# Patient Record
Sex: Male | Born: 1955
Health system: Southern US, Community
[De-identification: ages and names within clinical notes are randomized; demographics above are authoritative.]

## PROBLEM LIST (undated history)

## (undated) DIAGNOSIS — E785 Hyperlipidemia, unspecified: Secondary | ICD-10-CM

## (undated) DIAGNOSIS — I10 Essential (primary) hypertension: Secondary | ICD-10-CM

## (undated) DIAGNOSIS — R7303 Prediabetes: Secondary | ICD-10-CM

## (undated) DIAGNOSIS — K219 Gastro-esophageal reflux disease without esophagitis: Secondary | ICD-10-CM

## (undated) HISTORY — PX: WISDOM TOOTH EXTRACTION: SHX21

## (undated) HISTORY — DX: Essential (primary) hypertension: I10

## (undated) HISTORY — DX: Prediabetes: R73.03

## (undated) HISTORY — DX: Hyperlipidemia, unspecified: E78.5

## (undated) HISTORY — PX: TONSILLECTOMY AND ADENOIDECTOMY: SUR1326

---

## 2002-08-18 ENCOUNTER — Emergency Department (HOSPITAL_COMMUNITY): Admission: EM | Admit: 2002-08-18 | Discharge: 2002-08-18 | Payer: Self-pay | Admitting: Emergency Medicine

## 2002-08-21 ENCOUNTER — Ambulatory Visit (HOSPITAL_BASED_OUTPATIENT_CLINIC_OR_DEPARTMENT_OTHER): Admission: RE | Admit: 2002-08-21 | Discharge: 2002-08-21 | Payer: Self-pay | Admitting: Urology

## 2004-09-22 ENCOUNTER — Ambulatory Visit: Payer: Self-pay | Admitting: Internal Medicine

## 2004-09-28 ENCOUNTER — Ambulatory Visit: Payer: Self-pay | Admitting: Internal Medicine

## 2004-10-27 ENCOUNTER — Ambulatory Visit: Payer: Self-pay | Admitting: Internal Medicine

## 2005-09-27 ENCOUNTER — Ambulatory Visit: Payer: Self-pay | Admitting: Internal Medicine

## 2005-09-29 ENCOUNTER — Ambulatory Visit: Payer: Self-pay | Admitting: Internal Medicine

## 2005-10-04 ENCOUNTER — Ambulatory Visit: Payer: Self-pay | Admitting: Internal Medicine

## 2006-02-20 ENCOUNTER — Ambulatory Visit: Payer: Self-pay | Admitting: Internal Medicine

## 2006-02-20 LAB — CONVERTED CEMR LAB
ALT: 35 units/L (ref 0–40)
AST: 25 units/L (ref 0–37)
BUN: 8 mg/dL (ref 6–23)
Chol/HDL Ratio, serum: 7.1
Cholesterol: 192 mg/dL (ref 0–200)
Creatinine, Ser: 0.9 mg/dL (ref 0.4–1.5)
HDL: 27.1 mg/dL — ABNORMAL LOW (ref >39.0)
Hgb A1c MFr Bld: 5.5 % (ref 4.6–6.0)
LDL DIRECT: 105.4 mg/dL
Potassium: 3.6 meq/L (ref 3.5–5.1)
Triglyceride fasting, serum: 307 mg/dL (ref 0–149)
VLDL: 61 mg/dL — ABNORMAL HIGH (ref 0–40)

## 2006-02-27 ENCOUNTER — Ambulatory Visit: Payer: Self-pay | Admitting: Internal Medicine

## 2006-06-28 ENCOUNTER — Ambulatory Visit: Payer: Self-pay | Admitting: Internal Medicine

## 2006-06-28 LAB — CONVERTED CEMR LAB
ALT: 27 units/L (ref 0–40)
AST: 27 units/L (ref 0–37)
Cholesterol: 208 mg/dL (ref 0–200)
Creatinine,U: 236.6 mg/dL
Direct LDL: 134.7 mg/dL
HDL: 27.3 mg/dL — ABNORMAL LOW (ref >39.0)
Hgb A1c MFr Bld: 5.6 % (ref 4.6–6.0)
Microalb Creat Ratio: 65.5 mg/g — ABNORMAL HIGH (ref 0.0–30.0)
Microalb, Ur: 15.5 mg/dL — ABNORMAL HIGH (ref 0.0–1.9)
Total CHOL/HDL Ratio: 7.6
Triglycerides: 264 mg/dL (ref 0–149)
VLDL: 53 mg/dL — ABNORMAL HIGH (ref 0–40)

## 2006-07-05 ENCOUNTER — Ambulatory Visit: Payer: Self-pay | Admitting: Internal Medicine

## 2006-10-26 ENCOUNTER — Ambulatory Visit: Payer: Self-pay | Admitting: Internal Medicine

## 2006-10-30 ENCOUNTER — Encounter (INDEPENDENT_AMBULATORY_CARE_PROVIDER_SITE_OTHER): Payer: Self-pay | Admitting: *Deleted

## 2006-10-30 LAB — CONVERTED CEMR LAB
Basophils Absolute: 0 10*3/uL (ref 0.0–0.1)
Basophils Relative: 0 % (ref 0–1)
Eosinophils Absolute: 0.2 10*3/uL (ref 0.0–0.7)
Eosinophils Relative: 2 % (ref 0–5)
HCT: 47.1 % (ref 39.0–52.0)
Hemoglobin: 15.3 g/dL (ref 13.0–17.0)
Lymphocytes Relative: 15 % (ref 12–46)
Lymphs Abs: 1.6 10*3/uL (ref 0.7–3.3)
MCHC: 32.5 g/dL (ref 30.0–36.0)
MCV: 91.5 fL (ref 78.0–100.0)
Monocytes Absolute: 1 10*3/uL — ABNORMAL HIGH (ref 0.2–0.7)
Monocytes Relative: 9 % (ref 3–11)
Neutro Abs: 8.2 10*3/uL — ABNORMAL HIGH (ref 1.7–7.7)
Neutrophils Relative %: 74 % (ref 43–77)
Platelets: 342 10*3/uL (ref 150–400)
RBC: 5.15 M/uL (ref 4.22–5.81)
RDW: 13.6 % (ref 11.5–14.0)
TSH: 1.998 microintl units/mL (ref 0.350–5.50)
WBC: 11 10*3/uL — ABNORMAL HIGH (ref 4.0–10.5)

## 2007-01-16 ENCOUNTER — Ambulatory Visit: Payer: Self-pay | Admitting: Internal Medicine

## 2007-01-16 DIAGNOSIS — F528 Other sexual dysfunction not due to a substance or known physiological condition: Secondary | ICD-10-CM | POA: Insufficient documentation

## 2007-01-16 DIAGNOSIS — I10 Essential (primary) hypertension: Secondary | ICD-10-CM | POA: Insufficient documentation

## 2007-01-16 DIAGNOSIS — E8881 Metabolic syndrome: Secondary | ICD-10-CM | POA: Insufficient documentation

## 2007-01-16 DIAGNOSIS — N400 Enlarged prostate without lower urinary tract symptoms: Secondary | ICD-10-CM | POA: Insufficient documentation

## 2007-01-16 LAB — CONVERTED CEMR LAB
Cholesterol, target level: 200 mg/dL
HDL goal, serum: 40 mg/dL
LDL Goal: 130 mg/dL

## 2007-01-21 ENCOUNTER — Encounter (INDEPENDENT_AMBULATORY_CARE_PROVIDER_SITE_OTHER): Payer: Self-pay | Admitting: *Deleted

## 2007-01-21 LAB — CONVERTED CEMR LAB
ALT: 25 units/L (ref 0–53)
AST: 23 units/L (ref 0–37)
Albumin: 4.3 g/dL (ref 3.5–5.2)
Alkaline Phosphatase: 32 units/L — ABNORMAL LOW (ref 39–117)
BUN: 14 mg/dL (ref 6–23)
Bilirubin, Direct: 0.1 mg/dL (ref 0.0–0.3)
CO2: 32 meq/L (ref 19–32)
Calcium: 9.5 mg/dL (ref 8.4–10.5)
Chloride: 106 meq/L (ref 96–112)
Cholesterol: 220 mg/dL (ref 0–200)
Creatinine, Ser: 1 mg/dL (ref 0.4–1.5)
Direct LDL: 139.9 mg/dL
GFR calc Af Amer: 101 mL/min
GFR calc non Af Amer: 84 mL/min
Glucose, Bld: 88 mg/dL (ref 70–99)
HDL: 28.6 mg/dL — ABNORMAL LOW (ref >39.0)
Hgb A1c MFr Bld: 5.6 % (ref 4.6–6.0)
PSA: 0.58 ng/mL (ref 0.10–4.00)
Potassium: 3.9 meq/L (ref 3.5–5.1)
Sodium: 144 meq/L (ref 135–145)
TSH: 1.65 microintl units/mL (ref 0.35–5.50)
Total Bilirubin: 0.9 mg/dL (ref 0.3–1.2)
Total CHOL/HDL Ratio: 7.7
Total Protein: 7.3 g/dL (ref 6.0–8.3)
Triglycerides: 338 mg/dL (ref 0–149)
VLDL: 68 mg/dL — ABNORMAL HIGH (ref 0–40)

## 2007-03-26 ENCOUNTER — Telehealth (INDEPENDENT_AMBULATORY_CARE_PROVIDER_SITE_OTHER): Payer: Self-pay | Admitting: *Deleted

## 2007-04-18 HISTORY — PX: OTHER SURGICAL HISTORY: SHX169

## 2007-05-29 ENCOUNTER — Ambulatory Visit: Payer: Self-pay | Admitting: Internal Medicine

## 2007-05-29 DIAGNOSIS — E782 Mixed hyperlipidemia: Secondary | ICD-10-CM | POA: Insufficient documentation

## 2007-05-29 DIAGNOSIS — E785 Hyperlipidemia, unspecified: Secondary | ICD-10-CM | POA: Insufficient documentation

## 2007-09-20 ENCOUNTER — Ambulatory Visit: Payer: Self-pay | Admitting: Internal Medicine

## 2007-09-20 LAB — CONVERTED CEMR LAB
ALT: 38 units/L (ref 0–53)
AST: 32 units/L (ref 0–37)
Albumin: 4 g/dL (ref 3.5–5.2)
Alkaline Phosphatase: 36 units/L — ABNORMAL LOW (ref 39–117)
Bilirubin, Direct: 0.1 mg/dL (ref 0.0–0.3)
Cholesterol: 198 mg/dL (ref 0–200)
Direct LDL: 128.4 mg/dL
HDL: 27.8 mg/dL — ABNORMAL LOW (ref >39.0)
Total Bilirubin: 0.8 mg/dL (ref 0.3–1.2)
Total CHOL/HDL Ratio: 7.1
Total Protein: 7.2 g/dL (ref 6.0–8.3)
Triglycerides: 252 mg/dL (ref 0–149)
VLDL: 50 mg/dL — ABNORMAL HIGH (ref 0–40)

## 2007-09-27 ENCOUNTER — Ambulatory Visit: Payer: Self-pay | Admitting: Internal Medicine

## 2008-02-03 ENCOUNTER — Ambulatory Visit: Payer: Self-pay | Admitting: Internal Medicine

## 2008-02-09 LAB — CONVERTED CEMR LAB
ALT: 35 units/L (ref 0–53)
AST: 27 units/L (ref 0–37)
Albumin: 3.9 g/dL (ref 3.5–5.2)
Alkaline Phosphatase: 31 units/L — ABNORMAL LOW (ref 39–117)
BUN: 14 mg/dL (ref 6–23)
Basophils Absolute: 0 10*3/uL (ref 0.0–0.1)
Basophils Relative: 0.1 % (ref 0.0–3.0)
Bilirubin, Direct: 0.1 mg/dL (ref 0.0–0.3)
CO2: 30 meq/L (ref 19–32)
Calcium: 9 mg/dL (ref 8.4–10.5)
Chloride: 105 meq/L (ref 96–112)
Cholesterol: 227 mg/dL (ref 0–200)
Creatinine, Ser: 1.1 mg/dL (ref 0.4–1.5)
Direct LDL: 147.1 mg/dL
Eosinophils Absolute: 0.2 10*3/uL (ref 0.0–0.7)
Eosinophils Relative: 3.1 % (ref 0.0–5.0)
GFR calc Af Amer: 90 mL/min
GFR calc non Af Amer: 75 mL/min
Glucose, Bld: 89 mg/dL (ref 70–99)
HCT: 42 % (ref 39.0–52.0)
HDL: 24.4 mg/dL — ABNORMAL LOW (ref >39.0)
Hemoglobin: 14.6 g/dL (ref 13.0–17.0)
Hgb A1c MFr Bld: 5.7 % (ref 4.6–6.0)
Lymphocytes Relative: 29.2 % (ref 12.0–46.0)
MCHC: 34.8 g/dL (ref 30.0–36.0)
MCV: 90.7 fL (ref 78.0–100.0)
Monocytes Absolute: 0.5 10*3/uL (ref 0.1–1.0)
Monocytes Relative: 9.7 % (ref 3.0–12.0)
Neutro Abs: 3.2 10*3/uL (ref 1.4–7.7)
Neutrophils Relative %: 57.9 % (ref 43.0–77.0)
Platelets: 289 10*3/uL (ref 150–400)
Potassium: 3.3 meq/L — ABNORMAL LOW (ref 3.5–5.1)
RBC: 4.63 M/uL (ref 4.22–5.81)
RDW: 13.1 % (ref 11.5–14.6)
Sodium: 142 meq/L (ref 135–145)
Total Bilirubin: 0.9 mg/dL (ref 0.3–1.2)
Total CHOL/HDL Ratio: 9.3
Total Protein: 7 g/dL (ref 6.0–8.3)
Triglycerides: 206 mg/dL (ref 0–149)
VLDL: 41 mg/dL — ABNORMAL HIGH (ref 0–40)
WBC: 5.5 10*3/uL (ref 4.5–10.5)

## 2008-02-10 ENCOUNTER — Encounter (INDEPENDENT_AMBULATORY_CARE_PROVIDER_SITE_OTHER): Payer: Self-pay | Admitting: *Deleted

## 2008-02-10 ENCOUNTER — Ambulatory Visit: Payer: Self-pay | Admitting: Internal Medicine

## 2008-02-10 DIAGNOSIS — E876 Hypokalemia: Secondary | ICD-10-CM | POA: Insufficient documentation

## 2008-02-11 ENCOUNTER — Ambulatory Visit: Payer: Self-pay | Admitting: Internal Medicine

## 2008-02-14 ENCOUNTER — Encounter: Payer: Self-pay | Admitting: Internal Medicine

## 2008-02-14 ENCOUNTER — Ambulatory Visit: Payer: Self-pay | Admitting: Internal Medicine

## 2008-02-20 ENCOUNTER — Encounter: Payer: Self-pay | Admitting: Internal Medicine

## 2008-06-12 ENCOUNTER — Ambulatory Visit: Payer: Self-pay | Admitting: Internal Medicine

## 2008-06-12 LAB — CONVERTED CEMR LAB
ALT: 39 units/L (ref 0–53)
AST: 25 units/L (ref 0–37)
Albumin: 4.1 g/dL (ref 3.5–5.2)
Alkaline Phosphatase: 33 units/L — ABNORMAL LOW (ref 39–117)
BUN: 13 mg/dL (ref 6–23)
Bilirubin, Direct: 0.1 mg/dL (ref 0.0–0.3)
Cholesterol: 236 mg/dL (ref 0–200)
Creatinine, Ser: 1 mg/dL (ref 0.4–1.5)
Direct LDL: 159.6 mg/dL
HDL: 22.4 mg/dL — ABNORMAL LOW (ref >39.0)
Potassium: 3.2 meq/L — ABNORMAL LOW (ref 3.5–5.1)
Total Bilirubin: 0.7 mg/dL (ref 0.3–1.2)
Total CHOL/HDL Ratio: 10.5
Total Protein: 7 g/dL (ref 6.0–8.3)
Triglycerides: 232 mg/dL (ref 0–149)
VLDL: 46 mg/dL — ABNORMAL HIGH (ref 0–40)

## 2008-06-19 ENCOUNTER — Ambulatory Visit: Payer: Self-pay | Admitting: Internal Medicine

## 2008-08-14 ENCOUNTER — Ambulatory Visit: Payer: Self-pay | Admitting: Internal Medicine

## 2008-08-21 ENCOUNTER — Ambulatory Visit: Payer: Self-pay | Admitting: Internal Medicine

## 2008-08-21 ENCOUNTER — Telehealth (INDEPENDENT_AMBULATORY_CARE_PROVIDER_SITE_OTHER): Payer: Self-pay | Admitting: *Deleted

## 2008-09-23 ENCOUNTER — Ambulatory Visit: Payer: Self-pay | Admitting: Internal Medicine

## 2008-09-29 ENCOUNTER — Encounter (INDEPENDENT_AMBULATORY_CARE_PROVIDER_SITE_OTHER): Payer: Self-pay | Admitting: *Deleted

## 2008-09-29 LAB — CONVERTED CEMR LAB
ALT: 30 units/L (ref 0–53)
AST: 25 units/L (ref 0–37)
Albumin: 4.1 g/dL (ref 3.5–5.2)
Alkaline Phosphatase: 42 units/L (ref 39–117)
BUN: 14 mg/dL (ref 6–23)
Bilirubin, Direct: 0 mg/dL (ref 0.0–0.3)
Cholesterol: 149 mg/dL (ref 0–200)
Creatinine, Ser: 0.9 mg/dL (ref 0.4–1.5)
HDL: 29.5 mg/dL — ABNORMAL LOW (ref >39.00)
LDL Cholesterol: 86 mg/dL (ref 0–99)
Potassium: 3.3 meq/L — ABNORMAL LOW (ref 3.5–5.1)
Total Bilirubin: 0.8 mg/dL (ref 0.3–1.2)
Total CHOL/HDL Ratio: 5
Total Protein: 6.8 g/dL (ref 6.0–8.3)
Triglycerides: 169 mg/dL — ABNORMAL HIGH (ref 0.0–149.0)
VLDL: 33.8 mg/dL (ref 0.0–40.0)

## 2008-10-26 ENCOUNTER — Ambulatory Visit: Payer: Self-pay | Admitting: Internal Medicine

## 2008-10-31 LAB — CONVERTED CEMR LAB
BUN: 18 mg/dL (ref 6–23)
Creatinine, Ser: 1 mg/dL (ref 0.4–1.5)
Potassium: 3.9 meq/L (ref 3.5–5.1)

## 2008-11-02 ENCOUNTER — Encounter (INDEPENDENT_AMBULATORY_CARE_PROVIDER_SITE_OTHER): Payer: Self-pay | Admitting: *Deleted

## 2009-04-19 ENCOUNTER — Telehealth (INDEPENDENT_AMBULATORY_CARE_PROVIDER_SITE_OTHER): Payer: Self-pay | Admitting: *Deleted

## 2009-04-22 ENCOUNTER — Telehealth (INDEPENDENT_AMBULATORY_CARE_PROVIDER_SITE_OTHER): Payer: Self-pay | Admitting: *Deleted

## 2009-04-23 ENCOUNTER — Telehealth (INDEPENDENT_AMBULATORY_CARE_PROVIDER_SITE_OTHER): Payer: Self-pay | Admitting: *Deleted

## 2010-01-31 ENCOUNTER — Telehealth: Payer: Self-pay | Admitting: Internal Medicine

## 2010-04-14 ENCOUNTER — Ambulatory Visit: Payer: Self-pay | Admitting: Internal Medicine

## 2010-04-15 LAB — CONVERTED CEMR LAB
ALT: 33 units/L (ref 0–53)
AST: 28 units/L (ref 0–37)
Albumin: 4 g/dL (ref 3.5–5.2)
Alkaline Phosphatase: 41 units/L (ref 39–117)
Bilirubin, Direct: 0.1 mg/dL (ref 0.0–0.3)
Cholesterol: 188 mg/dL (ref 0–200)
Direct LDL: 106.9 mg/dL
HDL: 31.4 mg/dL — ABNORMAL LOW (ref >39.00)
Hgb A1c MFr Bld: 6 % (ref 4.6–6.5)
Total Bilirubin: 0.6 mg/dL (ref 0.3–1.2)
Total CHOL/HDL Ratio: 6
Total Protein: 7.1 g/dL (ref 6.0–8.3)
Triglycerides: 367 mg/dL — ABNORMAL HIGH (ref 0.0–149.0)
VLDL: 73.4 mg/dL — ABNORMAL HIGH (ref 0.0–40.0)

## 2010-04-21 ENCOUNTER — Ambulatory Visit
Admission: RE | Admit: 2010-04-21 | Discharge: 2010-04-21 | Payer: Self-pay | Source: Home / Self Care | Attending: Internal Medicine | Admitting: Internal Medicine

## 2010-04-21 ENCOUNTER — Encounter: Payer: Self-pay | Admitting: Internal Medicine

## 2010-04-21 ENCOUNTER — Other Ambulatory Visit: Payer: Self-pay | Admitting: Internal Medicine

## 2010-04-21 DIAGNOSIS — Z860100 Personal history of colon polyps, unspecified: Secondary | ICD-10-CM | POA: Insufficient documentation

## 2010-04-21 LAB — CREATININE, SERUM: Creatinine, Ser: 0.9 mg/dL (ref 0.4–1.5)

## 2010-04-21 LAB — POTASSIUM: Potassium: 3.6 mEq/L (ref 3.5–5.1)

## 2010-04-21 LAB — PSA: PSA: 0.62 ng/mL (ref 0.10–4.00)

## 2010-04-21 LAB — TSH: TSH: 1.87 u[IU]/mL (ref 0.35–5.50)

## 2010-04-21 LAB — BUN: BUN: 15 mg/dL (ref 6–23)

## 2010-04-24 DIAGNOSIS — Z87442 Personal history of urinary calculi: Secondary | ICD-10-CM | POA: Insufficient documentation

## 2010-05-17 NOTE — Assessment & Plan Note (Signed)
Summary: RTO 2 MONTHS.CBS   Vital Signs:  Patient profile:   55 year old male Weight:      206 pounds Temp:     98.0 degrees F oral Pulse rate:   80 / minute Resp:     16 per minute BP sitting:   136 / 88  (left arm) Cuff size:   large  Vitals Entered By: Shonna Chock (Aug 21, 2008 9:13 AM) CC: FOLLOW-UP : DISCUSS BLOODSUGAR/DIET, Hypertension Management   CC:  FOLLOW-UP : DISCUSS BLOODSUGAR/DIET and Hypertension Management.  History of Present Illness: He restricts sugar now; CVE as walking 30 min1-2X/week.Weight down 15-16# since last visit. BP essen @ same range @ home.  Hypertension History:      He denies headache, chest pain, palpitations, dyspnea with exertion, orthopnea, PND, peripheral edema, visual symptoms, neurologic problems, syncope, and side effects from treatment.  He notes no problems with any antihypertensive medication side effects.  Further comments include: Risk 4X normal with BP >135/85.        Positive major cardiovascular risk factors include male age 71 years old or older, hyperlipidemia, hypertension, and family history for ischemic heart disease (males less than 77 years old).  Negative major cardiovascular risk factors include no history of diabetes and non-tobacco-user status.        Further assessment for target organ damage reveals no history of ASHD, stroke/TIA, or peripheral vascular disease.     Allergies (verified): No Known Drug Allergies  Review of Systems Eyes:  Denies blurring and double vision. CV:  Denies leg cramps with exertion. Neuro:  Denies numbness and tingling.  Physical Exam  General:  well-nourished,in no acute distress; alert,appropriate and cooperative throughout examination Heart:  Normal rate and regular rhythm. S1 and S2 normal without gallop, murmur, click, rub or other extra sounds. Pulses:  R and L carotid,radial,dorsalis pedis and posterior tibial pulses are full and equal bilaterally Psych:  memory intact for recent  and remote, normally interactive, and good eye contact.  Focused & intelligent   Impression & Recommendations:  Problem # 1:  DISORDER, DYSMETABOLIC SYNDROME X (ICD-277.7)  Problem # 2:  HYPERTENSION, ESSENTIAL NOS (ICD-401.9)  His updated medication list for this problem includes:    Norvasc 10 Mg Tabs (Amlodipine besylate) .Marland Kitchen... 1 by mouth qd    Benazepril Hcl 40 Mg Tabs (Benazepril hcl) .Marland Kitchen... 1 by mouth qd  His updated medication list for this problem includes:    Norvasc 10 Mg Tabs (Amlodipine besylate) .Marland Kitchen... 1 by mouth qd    Benazepril Hcl 40 Mg Tabs (Benazepril hcl) .Marland Kitchen... 1 by mouth qd  Problem # 3:  HYPERLIPIDEMIA (ICD-272.2)  His updated medication list for this problem includes:    Niacin 500 Mg Tabs (Niacin) .Marland Kitchen... 1 by mouth once daily    Trilipix 135 Mg Cpdr (Choline fenofibrate) .Marland Kitchen... 1 qd    Crestor 10 Mg Tabs (Rosuvastatin calcium) .Marland Kitchen... 1 m,w, f  His updated medication list for this problem includes:    Niacin 500 Mg Tabs (Niacin) .Marland Kitchen... 1 by mouth once daily    Trilipix 135 Mg Cpdr (Choline fenofibrate) .Marland Kitchen... 1 qd    Crestor 10 Mg Tabs (Rosuvastatin calcium) .Marland Kitchen... 1 m,w, f  Complete Medication List: 1)  Norvasc 10 Mg Tabs (Amlodipine besylate) .Marland Kitchen.. 1 by mouth qd 2)  Benazepril Hcl 40 Mg Tabs (Benazepril hcl) .Marland Kitchen.. 1 by mouth qd 3)  Niacin 500 Mg Tabs (Niacin) .Marland Kitchen.. 1 by mouth once daily 4)  Adult Aspirin Low  Strength 81 Mg Tbdp (Aspirin) .Marland Kitchen.. 1 by mouth once daily 5)  Vitamin E 400 Unit Caps (Vitamin e) .Marland Kitchen.. 1 by mouth once daily 6)  Multivitamins Tabs (Multiple vitamin) .Marland Kitchen.. 1 by mouth once daily 7)  Fish Oil 1000 Mg Caps (Omega-3 fatty acids) .Marland Kitchen.. 1 by mouth once daily 8)  Trilipix 135 Mg Cpdr (Choline fenofibrate) .Marland Kitchen.. 1 qd 9)  Crestor 10 Mg Tabs (Rosuvastatin calcium) .Marland Kitchen.. 1 m,w, f 10)  Potassium Chloride Cr 10 Meq Cr-caps (Potassium chloride) .Marland Kitchen.. 1 once daily 11)  Cialis 20 Mg Tabs (Tadalafil) .Marland Kitchen.. 1 q 3 days as needed  Hypertension Assessment/Plan:       The patient's hypertensive risk group is category B: At least one risk factor (excluding diabetes) with no target organ damage.  Today's blood pressure is 136/88.    Patient Instructions: 1)  Continue the low glycemic index & load program  as discussed.

## 2010-05-17 NOTE — Miscellaneous (Signed)
Summary: [facility] Previsit/prep  Clinical Lists Changes  Medications: Added new medication of DULCOLAX 5 MG  TBEC (BISACODYL) Day before procedure take 2 at 3pm and 2 at 8pm. - Signed Added new medication of METOCLOPRAMIDE HCL 10 MG  TABS (METOCLOPRAMIDE HCL) As per prep instructions. - Signed Added new medication of MIRALAX   POWD (POLYETHYLENE GLYCOL 3350) As per prep  instructions. - Signed Rx of DULCOLAX 5 MG  TBEC (BISACODYL) Day before procedure take 2 at 3pm and 2 at 8pm.;  #4 x 0;  Signed;  Entered by: Wyona Almas RN;  Authorized by: Iva Boop MD;  Method used: Electronically to [facility]*, [address], [place], [place]  [address], Ph: [phone], Fax: [phone] Rx of METOCLOPRAMIDE HCL 10 MG  TABS (METOCLOPRAMIDE HCL) As per prep instructions.;  #2 x 0;  Signed;  Entered by: Wyona Almas RN;  Authorized by: Iva Boop MD;  Method used: Electronically to [facility]*, [address], [place], [place]  [address], Ph: [phone], Fax: [phone] Rx of MIRALAX   POWD (POLYETHYLENE GLYCOL 3350) As per prep  instructions.;  #255gm x 0;  Signed;  Entered by: Wyona Almas RN;  Authorized by: Iva Boop MD;  Method used: Electronically to [facility]*, [address], [place], [place]  [address], Ph: [phone], Fax: [phone] Observations: Added new observation of NKA: T (02/11/2008 15:23)    Prescriptions: MIRALAX   POWD (POLYETHYLENE GLYCOL 3350) As per prep  instructions.  #255gm x 0   Entered by:   Wyona Almas RN   Authorized by:   Iva Boop MD   Signed by:   Wyona Almas RN on 02/11/2008   Method used:   Electronically to        [facility]* (retail)       [address]       [place], [place]  [address]       Ph: [phone]       Fax: [phone]   RxID:   [id number] METOCLOPRAMIDE HCL 10 MG  TABS (METOCLOPRAMIDE HCL) As per prep instructions.  #2 x 0   Entered by:   Wyona Almas RN   Authorized by:   Iva Boop MD  Signed by:   Wyona Almas RN on 02/11/2008   Method used:   Electronically to        [facility]* (retail)       [address]       [place], [place]  [address]       Ph: [phone]       Fax: [phone]   RxID:   [id number] DULCOLAX 5 MG  TBEC (BISACODYL) Day before procedure take 2 at 3pm and 2 at 8pm.  #4 x 0   Entered by:   Wyona Almas RN   Authorized by:   Iva Boop MD   Signed by:   Wyona Almas RN on 02/11/2008   Method used:   Electronically to        [facility]* (retail)       [address]       [place], [place]  [address]       Ph: [phone]       Fax: [phone]   RxID:   [id number]

## 2010-05-17 NOTE — Letter (Signed)
Summary: Results Follow up Letter  [facility]  [address] [place], [place] [address]   Phone: [phone]  Fax: [phone]    10/30/2006 MRN: [id number]  Tim Decker [address] [facility] [place], [place]  [address]  Dear [name],  The following are the results of your recent test(s):  Test         Result    Pap Smear:        Normal _____  Not Normal _____ Comments: ______________________________________________________ Cholesterol: LDL(Bad cholesterol):         Your goal is less than:         HDL (Good cholesterol):       Your goal is more than: Comments:  ______________________________________________________ Mammogram:        Normal _____  Not Normal _____ Comments:  ___________________________________________________________________ Hemoccult:        Normal _____  Not normal _______ Comments:    _____________________________________________________________________ Other Tests:  Please see attached results and comments   We routinely do not discuss normal results over the telephone.  If you desire a copy of the results, or you have any questions about this information we can discuss them at your next office visit.   Sincerely,

## 2010-05-17 NOTE — Letter (Signed)
Summary: Results Follow up Letter  [facility]  [address] [place], [place] [address]   Phone: [phone]  Fax: [phone]    09/29/2008 MRN: [id number]  Tim Decker [address] [facility] [address] [place], [place]  [address]  Dear Mr. Yankowski,  The following are the results of your recent test(s):  Test         Result    Pap Smear:        Normal _____  Not Normal _____ Comments: ______________________________________________________ Cholesterol: LDL(Bad cholesterol):         Your goal is less than:         HDL (Good cholesterol):       Your goal is more than: Comments:  ______________________________________________________ Mammogram:        Normal _____  Not Normal _____ Comments:  ___________________________________________________________________ Hemoccult:        Normal _____  Not normal _______ Comments:    _____________________________________________________________________ Other Tests: PLEASE SEE COPY OF LABS FROM 09/23/08 AND COMMENTS    We routinely do not discuss normal results over the telephone.  If you desire a copy of the results, or you have any questions about this information we can discuss them at your next office visit.   Sincerely,

## 2010-05-17 NOTE — Assessment & Plan Note (Signed)
Summary: roa 4 months.cbs   Vital Signs:  Patient Profile:   55 Years Old Male Weight:      213 pounds Pulse rate:   60 / minute Resp:     15 per minute BP sitting:   138 / 72  (left arm)  Pt. in pain?   no  Vitals Entered By: Doristine Devoid (September 27, 2007 9:23 AM)                  Chief Complaint:  follow up on labs.  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 Serial labs & HTN & Lipid Management reviewed . Lovaza costs $100/month  Hypertension History:      He complains of dyspnea with exertion, visual symptoms, neurologic problems, and side effects from treatment, but denies headache, chest pain, palpitations, orthopnea, PND, peripheral edema, and syncope.  He notes the following problems with antihypertensive medication side effects: Eyes red with Viagra.  Further comments include: He was out of meds X 1 week ;BP up to 180/140. On meds 130-140/80s.        Positive major cardiovascular risk factors include male age 11 years old or older, hyperlipidemia, hypertension, and family history for ischemic heart disease (males less than 84 years old).  Negative major cardiovascular risk factors include no history of diabetes and non-tobacco-user status.        Further assessment for target organ damage reveals no history of ASHD, stroke/TIA, or peripheral vascular disease.    Lipid Management History:      Positive NCEP/ATP III risk factors include male age 64 years old or older, HDL cholesterol less than 40, family history for ischemic heart disease (males less than 38 years old), and hypertension.  Negative NCEP/ATP III risk factors include non-diabetic, non-tobacco-user status, no ASHD (atherosclerotic heart disease), no prior stroke/TIA, no peripheral vascular disease, and no history of aortic aneurysm.        Current Allergies: No known allergies   Past Medical History:    Hyperlipidemia    Hypertension    dysmetabolic syndrome  disorder    hyperplasia w/o urinary obs    erectile dysfunction     Review of Systems  General      Denies sleep disorder and weight loss.  Eyes      Complains of blurring.      Denies double vision and vision loss-both eyes.  CV      Denies bluish discoloration of lips or nails and leg cramps with exertion.  Neuro      Complains of numbness and tingling.      Denies weakness.      [place] N&T in hands  Endo      Denies cold intolerance, excessive hunger, excessive thirst, excessive urination, heat intolerance, polyuria, and weight change.   Physical Exam  General:     Well-developed,well-nourished,in no acute distress; alert,appropriate and cooperative throughout examination Lungs:     Normal respiratory effort, chest expands symmetrically. Lungs are clear to auscultation, no crackles or wheezes. Heart:     Normal rate and regular rhythm. S1 and S2 normal without gallop, murmur, click, rub . S4 with slurring Abdomen:     Bowel sounds positive,abdomen soft and non-tender without masses, organomegaly or hernias noted.Waist 41.5 inches Pulses:     R and L carotid,radial,dorsalis pedis and posterior tibial pulses are full and equal bilaterally Neurologic:     alert & oriented X3 and DTRs  symmetrical and normal.   Skin:     Intact without suspicious lesions or rashes Psych:     memory intact for recent and remote and normally interactive.      Impression & Recommendations:  Problem # 1:  HYPERLIPIDEMIA (ICD-272.2)  The following medications were removed from the medication list:    Lovaza 1 Gm Caps (Omega-3-acid ethyl esters) .Marland Kitchen... 2 pills two times a day to lower triglycerides only taking one tab daily  His updated medication list for this problem includes:    Triglide 160 Mg Tabs (Fenofibrate) .Marland Kitchen... 1 by mouth qd   Problem # 2:  HYPERTENSION, ESSENTIAL NOS (ICD-401.9)  His updated medication list for this problem includes:    Norvasc 10 Mg Tabs (Amlodipine  besylate) .Marland Kitchen... 1 by mouth qd    Benazepril Hcl 40 Mg Tabs (Benazepril hcl) .Marland Kitchen... 1 by mouth qd   Problem # 3:  DISORDER, DYSMETABOLIC SYNDROME X (ICD-277.7)  Complete Medication List: 1)  Norvasc 10 Mg Tabs (Amlodipine besylate) .Marland Kitchen.. 1 by mouth qd 2)  Triglide 160 Mg Tabs (Fenofibrate) .Marland Kitchen.. 1 by mouth qd 3)  Benazepril Hcl 40 Mg Tabs (Benazepril hcl) .Marland Kitchen.. 1 by mouth qd 4)  Metoprolol Succinate 50 Mg (metoprolol Succinate)  .Marland Kitchen.. 1 bid 5)  Niacin  6)  Asa 81mg   7)  Vit E 400iu  8)  Multivitamin  9)  Viagra 100 Mg Tabs (Sildenafil citrate) .... As needed  Hypertension Assessment/Plan:      The patient's hypertensive risk group is category B: At least one risk factor (excluding diabetes) with no target organ damage.  Today's blood pressure is 138/72.    Lipid Assessment/Plan:      Based on NCEP/ATP III, the patient's risk factor category is "2 or more risk factors and a calculated 10 year CAD risk of < 20%".  From this information, the patient's calculated lipid goals are as follows: Total cholesterol goal is 200; LDL cholesterol goal is 130; HDL cholesterol goal is 40; Triglyceride goal is 150.  His LDL cholesterol goal has been met.  Secondary causes for hyperlipidemia have been ruled out.  He has been counseled on adjunctive measures for lowering his cholesterol and has been provided with dietary instructions.     Patient Instructions: 1)   Stop Lovaza & follow TLC as discussed (exercise & [employer] AND [email or web address]) Codes for labs : 277.7,272.4. 2)  Please schedule a follow-up appointment in 4 months. 3)  Lipid Panel prior to visit, ICD-9: 4)  HbgA1C prior to visit, ICD-9:   Prescriptions: TRIGLIDE 160 MG  TABS (FENOFIBRATE) 1 by mouth qd  #90 x 1   Entered and Authorized by:   Marga Melnick MD   Signed by:   Marga Melnick MD on 09/27/2007   Method used:   Print then Give to Patient   RxID:   [id number]  ]

## 2010-05-17 NOTE — Letter (Signed)
Summary: Patient Notice-Hyperplastic Polyps  [facility]  [address] [place], [place] [address]   Phone: [phone]  Fax: [phone]        February 20, 2008 MRN: [id number]    Tim Decker [address] [facility] [address] [place], [place]  [address]    Dear [name],  I am pleased to inform you that the colon polyp(s) removed during your recent colonoscopy was (were) found to be hyperplastic.  These types of polyps are NOT pre-cancerous.  It is therefore my recommendation that you have a repeat colonoscopy examination in 10 years for routine colorectal cancer screening.  Should you develop new or worsening symptoms of abdominal pain, bowel habit changes or bleeding from the rectum or bowels, please schedule an evaluation with either your primary care physician or with me.  Please call us if you are having persistent problems or have questions about your condition that have not been fully answered at this time.  Sincerely,  Iva Boop MD  This letter has been electronically signed by your physician.

## 2010-05-17 NOTE — Assessment & Plan Note (Signed)
Summary: ROA & LAB.CBS   Vital Signs:  Patient Profile:   55 Years Old Male Weight:      203.50 pounds Pulse rate:   64 / minute Pulse rhythm:   regular Resp:     15 per minute BP sitting:   130 / 90  (left arm) Cuff size:   large  Pt. in pain?   no  Vitals Entered By: Wendall Stade (January 16, 2007 9:31 AM)                  Chief Complaint:  needs labs and needs scripts.  History of Present Illness: CVE 1X/ week; no diet, watching HFCS.  Hypertension History:      He complains of dyspnea with exertion, but denies headache, chest pain, palpitations, orthopnea, PND, peripheral edema, visual symptoms, neurologic problems, syncope, and side effects from treatment.  Further comments include: BP @ home 130/90; 130/68 @ dentist.        Positive major cardiovascular risk factors include male age 27 years old or older, hyperlipidemia, hypertension, and family history for ischemic heart disease (males less than 68 years old).  Negative major cardiovascular risk factors include no history of diabetes and non-tobacco-user status.        Further assessment for target organ damage reveals no history of ASHD, stroke/TIA, or peripheral vascular disease.    Lipid Management History:      Positive NCEP/ATP III risk factors include male age 33 years old or older, HDL cholesterol less than 40, family history for ischemic heart disease (males less than 11 years old), and hypertension.  Negative NCEP/ATP III risk factors include non-diabetic, non-tobacco-user status, no ASHD (atherosclerotic heart disease), no prior stroke/TIA, no peripheral vascular disease, and no history of aortic aneurysm.      Current Allergies (reviewed today): No known allergies  Updated/Current Medications (including changes made in today's visit):  NORVASC 10 MG  TABS (AMLODIPINE BESYLATE) 1 by mouth qd TRIGLIDE 160 MG  TABS (FENOFIBRATE) 1 by mouth qd BENAZEPRIL HCL 40 MG  TABS (BENAZEPRIL HCL) 1 by mouth  qd METOPROLOL SUCCINATE 100 MG  TB24 (METOPROLOL SUCCINATE) 1 by mouth qd * NIACIN  * ASA 81MG   * VIT E 400IU  * MULTIVITAMIN    Past Medical History:    Hyperlipidemia    Hypertension  Past Surgical History:    Tonsillectomy   Family History:    Father: MI age 51    Mother: DM    Siblings: neg   Risk Factors:  Tobacco use:  quit  Family History Risk Factors:    Family History of MI in females < 61 years old:  no    Family History of MI in males < 108 years old:  yes   Review of Systems  GI      Denies abdominal pain, bloody stools, change in bowel habits, constipation, dark tarry stools, diarrhea, and indigestion.  GU      Complains of decreased libido and erectile dysfunction.      Viagra helps  MS      Denies muscle weakness.   Physical Exam  General:     Well-developed,well-nourished,in no acute distress; alert,appropriate and cooperative throughout examination Head:     Alopecia Neck:     No deformities, masses, or tenderness noted. Lungs:     Normal respiratory effort, chest expands symmetrically. Lungs are clear to auscultation, no crackles or wheezes. Heart:     Normal rate and regular rhythm. S1 and  S2 normal without gallop, murmur, click, rub . S4  Abdomen:     Bowel sounds positive,abdomen soft and non-tender without masses, organomegaly or hernias noted. Pulses:     R and L carotid,radial,dorsalis pedis and posterior tibial pulses are full and equal bilaterally Skin:     Intact without suspicious lesions or rashes Psych:     Motivated & focused    Impression & Recommendations:  Problem # 1:  HYPERLIPIDEMIA (ICD-272.4)  His updated medication list for this problem includes:    Triglide 160 Mg Tabs (Fenofibrate) .Marland Kitchen... 1 by mouth qd  Orders: TLB-Hepatic/Liver Function Pnl (80076-HEPATIC)   Problem # 2:  HYPERTENSION (ICD-401.9)  His updated medication list for this problem includes:    Norvasc 10 Mg Tabs (Amlodipine besylate)  .Marland Kitchen... 1 by mouth qd    Benazepril Hcl 40 Mg Tabs (Benazepril hcl) .Marland Kitchen... 1 by mouth qd    Metoprolol Succinate 100 Mg Tb24 (Metoprolol succinate) .Marland Kitchen... 1 by mouth qd  Orders: TLB-BMP (Basic Metabolic Panel-BMET) (80048-METABOL)   Problem # 3:  ERECTILE DYSFUNCTION (ICD-302.72)  Problem # 4:  HYPERPLASIA, PRST NOS W/O URINARY OBST/LUTS (ICD-600.90)  Orders: TLB-PSA (Prostate Specific Antigen) (84153-PSA)   Problem # 5:  DISORDER, DYSMETABOLIC SYNDROME X (ICD-277.7)  Orders: TLB-TSH (Thyroid Stimulating Hormone) (84443-TSH) TLB-A1C / Hgb A1C (Glycohemoglobin) (83036-A1C)   Complete Medication List: 1)  Norvasc 10 Mg Tabs (Amlodipine besylate) .Marland Kitchen.. 1 by mouth qd 2)  Triglide 160 Mg Tabs (Fenofibrate) .Marland Kitchen.. 1 by mouth qd 3)  Benazepril Hcl 40 Mg Tabs (Benazepril hcl) .Marland Kitchen.. 1 by mouth qd 4)  Metoprolol Succinate 100 Mg Tb24 (Metoprolol succinate) .Marland Kitchen.. 1 by mouth qd 5)  Niacin  6)  Asa 81mg   7)  Vit E 400iu  8)  Multivitamin   Other Orders: Gastroenterology Referral (GI)  Hypertension Assessment/Plan:      The patient's hypertensive risk group is category B: At least one risk factor (excluding diabetes) with no target organ damage.  Today's blood pressure is 130/90.    Lipid Assessment/Plan:      Based on NCEP/ATP III, the patient's risk factor category is "2 or more risk factors and a calculated 10 year CAD risk of < 20%".  From this information, the patient's calculated lipid goals are as follows: Total cholesterol goal is 200; LDL cholesterol goal is 130; HDL cholesterol goal is 40; Triglyceride goal is 150.  His LDL cholesterol goal has not been met.  Secondary causes for hyperlipidemia have been ruled out.  He has been counseled on adjunctive measures for lowering his cholesterol and has been provided with dietary instructions.     Patient Instructions: 1)  Monitor Goal Sheet. Complete stool cards. Low carb diet of choice    Prescriptions: METOPROLOL SUCCINATE 100 MG   TB24 (METOPROLOL SUCCINATE) 1 by mouth qd  #90 x 3   Entered and Authorized by:   Marga Melnick MD   Signed by:   Marga Melnick MD on 01/16/2007   Method used:   Print then Give to Patient   RxID:   [id number] BENAZEPRIL HCL 40 MG  TABS (BENAZEPRIL HCL) 1 by mouth qd  #90 x 3   Entered and Authorized by:   Marga Melnick MD   Signed by:   Marga Melnick MD on 01/16/2007   Method used:   Print then Give to Patient   RxID:   [id number] TRIGLIDE 160 MG  TABS (FENOFIBRATE) 1 by mouth qd  #90 x 1   Entered and Authorized  by:   Marga Melnick MD   Signed by:   Marga Melnick MD on 01/16/2007   Method used:   Print then Give to Patient   RxID:   [id number] NORVASC 10 MG  TABS (AMLODIPINE BESYLATE) 1 by mouth qd  #90 x 3   Entered and Authorized by:   Marga Melnick MD   Signed by:   Marga Melnick MD on 01/16/2007   Method used:   Print then Give to Patient   RxID:   [id number]  ]  Appended Document: ROA & LAB.CBS  Laboratory Results   Urine Tests  Date/Time Recieved: ..................................................................[facility]  January 16, 2007 10:40 AM   Routine Urinalysis   Color: straw Appearance: Clear Glucose: negative   (Normal Range: Negative) Bilirubin: negative   (Normal Range: Negative) Ketone: negative   (Normal Range: Negative) Spec. Gravity: 1.010   (Normal Range: 1.003-1.035) Blood: negative   (Normal Range: Negative) pH: 7.5   (Normal Range: 5.0-8.0) Protein: negative   (Normal Range: Negative) Urobilinogen: negative   (Normal Range: 0-1) Nitrite: negative   (Normal Range: Negative) Leukocyte Esterace: negative   (Normal Range: Negative)

## 2010-05-17 NOTE — Assessment & Plan Note (Signed)
Summary: roa review lab.cbs   Vital Signs:  Patient Profile:   55 Years Old Male Weight:      218.38 pounds Pulse rate:   56 / minute Pulse rhythm:   regular BP sitting:   130 / 74  (left arm) Cuff size:   large  Pt. in pain?   no  Vitals Entered By: Wendall Stade (May 29, 2007 9:03 AM)                  Chief Complaint:  follow up labs from october.  History of Present Illness: Tim Decker is taking lovasa once daily, states not told to take 2 tabs two times a day . Poor diet over holidays.Serial labs reviewed & Metabolic Syndrome p-p discussed.CVE 2X /week w/o C-P symptoms.  Lipid Management History:      Positive NCEP/ATP III risk factors include male age 31 years old or older, HDL cholesterol less than 40, family history for ischemic heart disease (males less than 18 years old), and hypertension.  Negative NCEP/ATP III risk factors include non-diabetic, non-tobacco-user status, no ASHD (atherosclerotic heart disease), no prior stroke/TIA, no peripheral vascular disease, and no history of aortic aneurysm.       Current Allergies (reviewed today): No known allergies   Past Medical History:    Reviewed history from 01/16/2007 and no changes required:       Hyperlipidemia       Hypertension       dysmetabolic syndrome disorder       hyperplasia w/o urinary obs       erectile dysfunction       constipation  Past Surgical History:    Reviewed history from 01/16/2007 and no changes required:       Tonsillectomy   Family History:    Reviewed history from 01/16/2007 and no changes required:       Father: MI age 71       Mother: DM       Siblings: neg  Social History:    Reviewed history and no changes required:       Former Smoker quit 11/01   Risk Factors:  Tobacco use:  quit   Review of Systems  CV      Denies chest pain or discomfort, difficulty breathing at night, difficulty breathing while lying down, leg cramps with exertion, palpitations,  shortness of breath with exertion, swelling of feet, and swelling of hands.   Physical Exam  General:     Well-developed,well-nourished,in no acute distress; alert,appropriate and cooperative throughout examination; overweight-appearing.   Lungs:     Normal respiratory effort, chest expands symmetrically. Lungs are clear to auscultation, no crackles or wheezes. Heart:     Normal rate and regular rhythm. S1 and S2 normal without gallop, murmur, click, rub . S4 with slurring Abdomen:     Bowel sounds positive,abdomen soft and non-tender without masses, organomegaly or hernias noted.Protuberant Pulses:     R and L carotid,radial,dorsalis pedis and posterior tibial pulses are full and equal bilaterally    Impression & Recommendations:  Problem # 1:  HYPERLIPIDEMIA (ICD-272.2)  His updated medication list for this problem includes:    Triglide 160 Mg Tabs (Fenofibrate) .Marland Kitchen... 1 by mouth qd    Lovaza 1 Gm Caps (Omega-3-acid ethyl esters) .Marland Kitchen... 2 pills two times a day to lower triglycerides only taking one tab daily   Problem # 2:  HYPERTENSION, ESSENTIAL NOS (ICD-401.9)  His updated medication list for this problem includes:  Norvasc 10 Mg Tabs (Amlodipine besylate) .Marland Kitchen... 1 by mouth qd    Benazepril Hcl 40 Mg Tabs (Benazepril hcl) .Marland Kitchen... 1 by mouth qd   Complete Medication List: 1)  Norvasc 10 Mg Tabs (Amlodipine besylate) .Marland Kitchen.. 1 by mouth qd 2)  Triglide 160 Mg Tabs (Fenofibrate) .Marland Kitchen.. 1 by mouth qd 3)  Benazepril Hcl 40 Mg Tabs (Benazepril hcl) .Marland Kitchen.. 1 by mouth qd 4)  Metoprolol Succinate 50 Mg (metoprolol Succinate)  .Marland Kitchen.. 1 bid 5)  Niacin  6)  Asa 81mg   7)  Vit E 400iu  8)  Multivitamin  9)  Lovaza 1 Gm Caps (Omega-3-acid ethyl esters) .... 2 pills two times a day to lower triglycerides only taking one tab daily  Lipid Assessment/Plan:      Based on NCEP/ATP III, the patient's risk factor category is "2 or more risk factors and a calculated 10 year CAD risk of < 20%".  From  this information, the patient's calculated lipid goals are as follows: Total cholesterol goal is 200; LDL cholesterol goal is 130; HDL cholesterol goal is 40; Triglyceride goal is 150.  His LDL cholesterol goal has not been met.  Secondary causes for hyperlipidemia have been ruled out.  He has been counseled on adjunctive measures for lowering his cholesterol and has been provided with dietary instructions.     Patient Instructions: 1)  Hepatic Panel prior to visit, ICD-9: 272.4,277.7 2)  Lipid Panel prior to visit, ICD-9: 272.4,277.7 3)  Please schedule a follow-up appointment in 4 months.    Prescriptions: NORVASC 10 MG  TABS (AMLODIPINE BESYLATE) 1 by mouth qd  #90 x 3   Entered and Authorized by:   Marga Melnick MD   Signed by:   Marga Melnick MD on 05/29/2007   Method used:   Print then Give to Patient   RxID:   [id number] TRIGLIDE 160 MG  TABS (FENOFIBRATE) 1 by mouth qd  #90 x 1   Entered and Authorized by:   Marga Melnick MD   Signed by:   Marga Melnick MD on 05/29/2007   Method used:   Print then Give to Patient   RxID:   [id number] BENAZEPRIL HCL 40 MG  TABS (BENAZEPRIL HCL) 1 by mouth qd  #90 x 3   Entered and Authorized by:   Marga Melnick MD   Signed by:   Marga Melnick MD on 05/29/2007   Method used:   Print then Give to Patient   RxID:   [id number] LOVAZA 1 GM  CAPS (OMEGA-3-ACID ETHYL ESTERS) 2 pills two times a day to lower triglycerides only taking one tab daily  #120 x 5   Entered and Authorized by:   Marga Melnick MD   Signed by:   Marga Melnick MD on 05/29/2007   Method used:   Print then Give to Patient   RxID:   [id number]  ]

## 2010-05-17 NOTE — Progress Notes (Signed)
Summary: refill-r Cialis  Phone Note Refill Request Message from:  Pharmacy on April 19, 2009 2:24 PM  Refills Requested: Medication #1:  CIALIS 20 MG TABS 1 q 3 days as needed.   Dosage confirmed as above?Dosage Confirmed   Brand Name Necessary? Yes   Supply Requested: 3 months refill suthorization request  from [facility] on [address]  in [place]  Initial call taken by: Michaelle Copas,  April 19, 2009 2:24 PM  Follow-up for Phone Call        spoke with pharmacy rx filled and ready for pt to pick-up...............Marland KitchenFelecia Deloach CMA  April 19, 2009 3:17 PM

## 2010-05-17 NOTE — Progress Notes (Signed)
Summary: HOPPER- FOR 90 DAYS   Phone Note Refill Request   Refills Requested: Medication #1:  NORVASC 10 MG  TABS 1 by mouth qd  Medication #2:  TRIGLIDE 160 MG  TABS 1 by mouth qd  Medication #3:  BENAZEPRIL HCL 40 MG  TABS 1 by mouth qd  Medication #4:  METOPROLOL SUCCINATE 100 MG  TB24 1 by mouth qd 90 DAY FAX TO [phone]  Initial call taken by: Freddy Jaksch,  March 26, 2007 11:23 AM  Follow-up for Phone Call        SPOKE WITH PT WHO SAYS CHANGED INS CO AND NEED NEW RX . WILL PICK UP AND SEND TO [facility] CALL WORK WHEN READY...................................................................Marland KitchenKandice Hams  March 26, 2007 11:40 AM called pt rx ready for pickup  Follow-up by: Kandice Hams,  March 26, 2007 11:44 AM      Prescriptions: METOPROLOL SUCCINATE 100 MG  TB24 (METOPROLOL SUCCINATE) 1 by mouth qd  #90 x 3   Entered by:   Kandice Hams   Authorized by:   Marga Melnick MD   Signed by:   Kandice Hams on 03/26/2007   Method used:   Print then Give to Patient   RxID:   [id number] BENAZEPRIL HCL 40 MG  TABS (BENAZEPRIL HCL) 1 by mouth qd  #90 x 3   Entered by:   Kandice Hams   Authorized by:   Marga Melnick MD   Signed by:   Kandice Hams on 03/26/2007   Method used:   Print then Give to Patient   RxID:   [id number] TRIGLIDE 160 MG  TABS (FENOFIBRATE) 1 by mouth qd  #90 x 1   Entered by:   Kandice Hams   Authorized by:   Marga Melnick MD   Signed by:   Kandice Hams on 03/26/2007   Method used:   Print then Give to Patient   RxID:   [id number] NORVASC 10 MG  TABS (AMLODIPINE BESYLATE) 1 by mouth qd  #90 x 3   Entered by:   Kandice Hams   Authorized by:   Marga Melnick MD   Signed by:   Kandice Hams on 03/26/2007   Method used:   Print then Give to Patient   RxID:   [id number]

## 2010-05-17 NOTE — Progress Notes (Signed)
Summary: med concerns  Phone Note Call from Patient   Caller: Patient Summary of Call: left message to call  office. call in regards to cialis 20 mg was just filled on 04-13-09 #3 5. need to verify med............Marland KitchenFelecia Deloach CMA  April 22, 2009 11:14 AM   Follow-up for Phone Call        Left message on machine for patient to return call when avaliable, Reason for call:   Refill Request Follow-up by: Shonna Chock,  April 22, 2009 2:31 PM  Additional Follow-up for Phone Call Additional follow up Details #1::        pt does not need this med refilled. pt wants and uses the cialis and the pharmacy has told pt that they are waiting on [place], informed pt that we spoke with pharmacy on 04-19-09 and they states that rx was ready for pt to pick-up. pt will call pharmacy............Marland KitchenFelecia Deloach CMA  April 22, 2009 3:17 PM

## 2010-05-17 NOTE — Assessment & Plan Note (Signed)
Summary: discuss med/alr  n/s alr   Vital Signs:  Patient profile:   55 year old male Weight:      202.6 pounds Pulse rate:   72 / minute Resp:     15 per minute BP sitting:   136 / 82  (left arm) Cuff size:   large  Vitals Entered By: Shonna Chock (October 26, 2008 8:55 AM) CC: F/U ON MED (REFILL ALL MEDS), Hypertension Management Comments REVIEWED MED LIST, PATIENT AGREED DOSE AND INSTRUCTION CORRECT    CC:  F/U ON MED (REFILL ALL MEDS) and Hypertension Management.  History of Present Illness: BP not checked ; usually 130-140/80. Following low glycemic index & load diet. Weight stable;  decrease of 2 inches in waist.  Hypertension History:      He denies headache, chest pain, palpitations, dyspnea with exertion, orthopnea, PND, peripheral edema, neurologic problems, syncope, and side effects from treatment.  He notes no problems with any antihypertensive medication side effects.  Decreased vision; exam due.        Positive major cardiovascular risk factors include male age 39 years old or older, hyperlipidemia, hypertension, and family history for ischemic heart disease (males less than 41 years old).  Negative major cardiovascular risk factors include no history of diabetes and non-tobacco-user status.        Further assessment for target organ damage reveals no history of ASHD, stroke/TIA, or peripheral vascular disease.     Allergies (verified): No Known Drug Allergies  Review of Systems Eyes:  Complains of blurring; denies double vision and vision loss-both eyes; Using reading glasses. Neuro:  Denies numbness and tingling.  Physical Exam  General:  well-nourished,in no acute distress; alert,appropriate and cooperative throughout examination Heart:  Normal rate and regular rhythm. S1 and S2 normal without gallop, murmur, click, rub or other extra sounds. Pulses:  R and L carotid,radial,dorsalis pedis and posterior tibial pulses are full and equal bilaterally Skin:  Intact  without suspicious lesions or rashes Psych:  memory intact for recent and remote, normally interactive, and good eye contact. Focused & motivated   Impression & Recommendations:  Problem # 1:  HYPERTENSION, ESSENTIAL NOS (ICD-401.9)  His updated medication list for this problem includes:    Norvasc 10 Mg Tabs (Amlodipine besylate) .Marland Kitchen... 1 by mouth qd    Benazepril Hcl 40 Mg Tabs (Benazepril hcl) .Marland Kitchen... 1 by mouth qd  Orders: Venipuncture (16109) TLB-Creatinine, Blood (82565-CREA) TLB-Potassium (K+) (84132-K) TLB-BUN (Urea Nitrogen) (84520-BUN)  Problem # 2:  HYPOPOTASSEMIA (ICD-276.8)  Orders: Venipuncture (60454) TLB-Potassium (K+) (84132-K)  Problem # 3:  HYPERLIPIDEMIA (ICD-272.2)  His updated medication list for this problem includes:    Niacin 500 Mg Tabs (Niacin) .Marland Kitchen... 1 by mouth once daily    Trilipix 135 Mg Cpdr (Choline fenofibrate) .Marland Kitchen... 1 qd    Crestor 10 Mg Tabs (Rosuvastatin calcium) .Marland Kitchen... 1 m,w, f  Complete Medication List: 1)  Norvasc 10 Mg Tabs (Amlodipine besylate) .Marland Kitchen.. 1 by mouth qd 2)  Benazepril Hcl 40 Mg Tabs (Benazepril hcl) .Marland Kitchen.. 1 by mouth qd 3)  Niacin 500 Mg Tabs (Niacin) .Marland Kitchen.. 1 by mouth once daily 4)  Adult Aspirin Low Strength 81 Mg Tbdp (Aspirin) .Marland Kitchen.. 1 by mouth once daily 5)  Vitamin E 400 Unit Caps (Vitamin e) .Marland Kitchen.. 1 by mouth once daily 6)  Multivitamins Tabs (Multiple vitamin) .Marland Kitchen.. 1 by mouth once daily 7)  Fish Oil 1000 Mg Caps (Omega-3 fatty acids) .Marland Kitchen.. 1 by mouth once daily 8)  Trilipix 135 Mg  Cpdr (Choline fenofibrate) .Marland Kitchen.. 1 qd 9)  Crestor 10 Mg Tabs (Rosuvastatin calcium) .Marland Kitchen.. 1 m,w, f 10)  Potassium Chloride Cr 10 Meq Cr-caps (Potassium chloride) .Marland Kitchen.. 1 once daily 11)  Cialis 20 Mg Tabs (Tadalafil) .Marland Kitchen.. 1 q 3 days as needed  Hypertension Assessment/Plan:      The patient's hypertensive risk group is category B: At least one risk factor (excluding diabetes) with no target organ damage.  His calculated 10 year risk of coronary heart  disease is 7 %.  Today's blood pressure is 136/82.    Patient Instructions: 1)  Please schedule a follow-up appointment in 3 months. 2)  Hepatic Panel prior to visit, ICD-9:995.20 3)  Lipid Panel prior to visit, ICD-9:2272.4 4)  HbgA1C prior to visit, ICD-9:277.7

## 2010-05-17 NOTE — Assessment & Plan Note (Signed)
Summary: roa 4 months.cbs   Vital Signs:  Patient Profile:   55 Years Old Male Weight:      222.4 pounds Temp:     97.2 degrees F oral Pulse rate:   74 / minute Resp:     16 per minute BP sitting:   110 / 78  (left arm) Cuff size:   large  Vitals Entered By: Shonna Chock (June 19, 2008 9:31 AM)                 Chief Complaint:  FOLLOW-UP ON LABS .  History of Present Illness: No CVE this Winter; he eats out. Labs reviewed: LDL 159.6 ( 147.1 in 10/09), K+ 3.2 (not on HCTZ),HDL 22.4, & TG 232 ( 206). Concept of low glycemic index & load discussed.    Current Allergies: No known allergies      Review of Systems  CV      Complains of shortness of breath with exertion.      Denies chest pain or discomfort, leg cramps with exertion, swelling of feet, and swelling of hands.   Physical Exam  General:     in no acute distress; alert,appropriate and cooperative throughout examination Lungs:     Normal respiratory effort, chest expands symmetrically. Lungs are clear to auscultation, no crackles or wheezes. Heart:     Normal rate and regular rhythm. S1 and S2 normal without gallop, murmur, click, rub. S4 with slurring Abdomen:     Bowel sounds positive,abdomen soft and non-tender without masses, organomegaly or hernias noted. Pulses:     R and L carotid,radial,dorsalis pedis and posterior tibial pulses are full and equal bilaterally    Impression & Recommendations:  Problem # 1:  HYPOPOTASSEMIA (ICD-276.8)  Problem # 2:  HYPERLIPIDEMIA (ICD-272.2)  The following medications were removed from the medication list:    Triglide 160 Mg Tabs (Fenofibrate) .Marland Kitchen... 1 by mouth qd    Lovaza 1 Gm Caps (Omega-3-acid ethyl esters) .Marland Kitchen... 2 pills bid  His updated medication list for this problem includes:    Niacin 500 Mg Tabs (Niacin) .Marland Kitchen... 1 by mouth once daily    Trilipix 135 Mg Cpdr (Choline fenofibrate) .Marland Kitchen... 1 qd    Crestor 10 Mg Tabs (Rosuvastatin calcium) .Marland Kitchen... 1 m,w,  f   Problem # 3:  DISORDER, DYSMETABOLIC SYNDROME X (ICD-277.7)  Complete Medication List: 1)  Norvasc 10 Mg Tabs (Amlodipine besylate) .Marland Kitchen.. 1 by mouth qd 2)  Benazepril Hcl 40 Mg Tabs (Benazepril hcl) .Marland Kitchen.. 1 by mouth qd 3)  Niacin 500 Mg Tabs (Niacin) .Marland Kitchen.. 1 by mouth once daily 4)  Adult Aspirin Low Strength 81 Mg Tbdp (Aspirin) .Marland Kitchen.. 1 by mouth once daily 5)  Vitamin E 400 Unit Caps (Vitamin e) .Marland Kitchen.. 1 by mouth once daily 6)  Multivitamins Tabs (Multiple vitamin) .Marland Kitchen.. 1 by mouth once daily 7)  Fish Oil 1000 Mg Caps (Omega-3 fatty acids) .Marland Kitchen.. 1 by mouth once daily 8)  Trilipix 135 Mg Cpdr (Choline fenofibrate) .Marland Kitchen.. 1 qd 9)  Crestor 10 Mg Tabs (Rosuvastatin calcium) .Marland Kitchen.. 1 m,w, f 10)  Potassium Chloride Cr 10 Meq Cr-caps (Potassium chloride) .Marland Kitchen.. 1 once daily 11)  Cialis 20 Mg Tabs (Tadalafil) .Marland Kitchen.. 1 q 3 days as needed   Patient Instructions: 1)  fasting labs in 8 weeks: 2)  BU,creat<K+ prior to visit, ICD-9: 401.9 3)  Hepatic Panel prior to visit, ICD-9:995.20 4)  Lipid Panel prior to visit, ICD-9:272.4. Follow [employer] ,low glycemic index &  load as discussed. Please consider Nutrition consult ( declined due to insurance)    Prescriptions: CIALIS 20 MG TABS (TADALAFIL) 1 q 3 days as needed  #3 x 5   Entered and Authorized by:   Marga Melnick MD   Signed by:   Marga Melnick MD on 06/19/2008   Method used:   Print then Give to Patient   RxID:   [id number] POTASSIUM CHLORIDE CR 10 MEQ CR-CAPS (POTASSIUM CHLORIDE) 1 once daily  #30 x 2   Entered and Authorized by:   Marga Melnick MD   Signed by:   Marga Melnick MD on 06/19/2008   Method used:   Print then Give to Patient   RxID:   [id number] CRESTOR 10 MG TABS (ROSUVASTATIN CALCIUM) 1 M,W, F  #28 x 0   Entered and Authorized by:   Marga Melnick MD   Signed by:   Marga Melnick MD on 06/19/2008   Method used:   Print then Give to Patient   RxID:   [id number] TRILIPIX 135 MG CPDR (CHOLINE  FENOFIBRATE) 1 qd  #30 x 2   Entered and Authorized by:   Marga Melnick MD   Signed by:   Marga Melnick MD on 06/19/2008   Method used:   Print then Give to Patient   RxID:   [id number]

## 2010-05-17 NOTE — Letter (Signed)
Summary: Results Follow up Letter  [place] [facility]  [address] [place], [place] [address]   Phone: [phone]  Fax: [phone]    11/02/2008 MRN: [id number]  Tim Decker [address] [facility] [address] [place], [place]  [address]  Dear Mr. Chaikin,  The following are the results of your recent test(s):  Test         Result    Pap Smear:        Normal _____  Not Normal _____ Comments: ______________________________________________________ Cholesterol: LDL(Bad cholesterol):         Your goal is less than:         HDL (Good cholesterol):       Your goal is more than: Comments:  ______________________________________________________ Mammogram:        Normal _____  Not Normal _____ Comments:  ___________________________________________________________________ Hemoccult:        Normal _____  Not normal _______ Comments:    _____________________________________________________________________ Other Tests: PLEASE SEE ATTACHED LABS DONE ON 10/26/2008    We routinely do not discuss normal results over the telephone.  If you desire a copy of the results, or you have any questions about this information we can discuss them at your next office visit.   Sincerely,

## 2010-05-17 NOTE — Progress Notes (Signed)
Summary: Lab Work  Phone Note Call from Patient Call back at [employer] [phone]   Caller: Patient Summary of Call: Patient called this morning to schedule his cpx and would like to have labs done before the appointment. What labs and codes please? Initial call taken by: Harold Barban,  January 31, 2010 10:20 AM  Follow-up for Phone Call        these are listed in Patient Instructions 10/26/2009. Please always look @ last OV for requested labs ( lipids, hepatic panel, A1c; 277.7) Follow-up by: Marga Melnick MD,  January 31, 2010 12:54 PM

## 2010-05-17 NOTE — Assessment & Plan Note (Signed)
Summary: roa 4 months. review lab/cbs   Vital Signs:  Patient Profile:   55 Years Old Male Weight:      216.8 pounds Temp:     97.8 degrees F oral Pulse rate:   72 / minute Resp:     17 per minute BP sitting:   138 / 84  (left arm) Cuff size:   large  Vitals Entered By: Shonna Chock (February 10, 2008 9:19 AM)                 Chief Complaint:  4 MONTH FOLLOW-UP and COPY OF LABS GIVEN.  History of Present Illness: Present labs compared to 6/09 & 11/07. All improved but not @ goal . No diet; no CVE. BP minimally elevated. Risks discussed. Low K+ unexplained on med review.  Insurance to be changed in 2010 (will go to [employer]). Lipid  & HTN Panels reviewed.  Hypertension History:      He denies headache, chest pain, palpitations, dyspnea with exertion, orthopnea, PND, peripheral edema, visual symptoms, neurologic problems, and syncope.  Further comments include: BP OK when checked.        Positive major cardiovascular risk factors include male age 57 years old or older, hyperlipidemia, hypertension, and family history for ischemic heart disease (males less than 66 years old).  Negative major cardiovascular risk factors include no history of diabetes and non-tobacco-user status.        Further assessment for target organ damage reveals no history of ASHD, stroke/TIA, or peripheral vascular disease.    Lipid Management History:      Positive NCEP/ATP III risk factors include male age 61 years old or older, HDL cholesterol less than 40, family history for ischemic heart disease (males less than 14 years old), and hypertension.  Negative NCEP/ATP III risk factors include non-diabetic, non-tobacco-user status, no ASHD (atherosclerotic heart disease), no prior stroke/TIA, no peripheral vascular disease, and no history of aortic aneurysm.        Current Allergies (reviewed today): No known allergies      Review of Systems  Eyes      Denies blurring, double vision, and vision loss-both  eyes.  CV      Denies bluish discoloration of lips or nails and leg cramps with exertion.  GI      Denies abdominal pain, bloody stools, and dark tarry stools.      No colonoscopy ; "I keep putting if off" (next ins will have $1000 deductable)  GU      Complains of erectile dysfunction.      Denies decreased libido.      Rapid climax  Neuro      Denies disturbances in coordination, numbness, poor balance, and tingling.   Physical Exam  General:     well-nourished,in no acute distress; alert,appropriate and cooperative throughout examination Head:     Normocephalic and atraumatic without obvious abnormalities. Pattern  alopecia or balding. Neck:     No deformities, masses, or tenderness noted. Lungs:     Normal respiratory effort, chest expands symmetrically. Lungs are clear to auscultation, no crackles or wheezes. Heart:     Normal rate and regular rhythm. S1 and S2 normal without gallop, murmur, click, rub or other extra sounds. Abdomen:     Bowel sounds positive,abdomen soft and non-tender without masses, organomegaly or hernias noted. Pulses:     R and L carotid,radial,dorsalis pedis and posterior tibial pulses are full and equal bilaterally Extremities:     No clubbing, cyanosis,  edema, or deformity noted  Neurologic:     alert & oriented X3 and DTRs symmetrical and normal.   Skin:     Minor stasis of ankles Psych:     memory intact for recent and remote, normally interactive, and good eye contact.      Impression & Recommendations:  Problem # 1:  HYPERLIPIDEMIA (ICD-272.2)  His updated medication list for this problem includes:    Triglide 160 Mg Tabs (Fenofibrate) .Marland Kitchen... 1 by mouth qd    Niacin 500 Mg Tabs (Niacin) .Marland Kitchen... 1 by mouth once daily    Lovaza 1 Gm Caps (Omega-3-acid ethyl esters) .Marland Kitchen... 2 pills bid   Problem # 2:  HYPERTENSION, ESSENTIAL NOS (ICD-401.9)  His updated medication list for this problem includes:    Norvasc 10 Mg Tabs (Amlodipine  besylate) .Marland Kitchen... 1 by mouth qd    Benazepril Hcl 40 Mg Tabs (Benazepril hcl) .Marland Kitchen... 1 by mouth qd   Problem # 3:  HYPOPOTASSEMIA (ICD-276.8)  Problem # 4:  SCREENING FOR MALIGNANT NEOPLASM, COLON (ICD-V76.51)  Orders: Gastroenterology Referral (GI)   Problem # 5:  ERECTILE DYSFUNCTION (ICD-302.72) Role of HTN,DM & lipids discussed His updated medication list for this problem includes:    Viagra 100 Mg Tabs (Sildenafil citrate) .Marland Kitchen... As needed   Complete Medication List: 1)  Norvasc 10 Mg Tabs (Amlodipine besylate) .Marland Kitchen.. 1 by mouth qd 2)  Triglide 160 Mg Tabs (Fenofibrate) .Marland Kitchen.. 1 by mouth qd 3)  Benazepril Hcl 40 Mg Tabs (Benazepril hcl) .Marland Kitchen.. 1 by mouth qd 4)  Niacin 500 Mg Tabs (Niacin) .Marland Kitchen.. 1 by mouth once daily 5)  Adult Aspirin Low Strength 81 Mg Tbdp (Aspirin) .Marland Kitchen.. 1 by mouth once daily 6)  Vitamin E 400 Unit Caps (Vitamin e) .Marland Kitchen.. 1 by mouth once daily 7)  Multivitamins Tabs (Multiple vitamin) .Marland Kitchen.. 1 by mouth once daily 8)  Viagra 100 Mg Tabs (Sildenafil citrate) .... As needed 9)  Lovaza 1 Gm Caps (Omega-3-acid ethyl esters) .... 2 pills bid 10)  Fish Oil 1000 Mg Caps (Omega-3 fatty acids) .Marland Kitchen.. 1 by mouth once daily  Hypertension Assessment/Plan:      The patient's hypertensive risk group is category B: At least one risk factor (excluding diabetes) with no target organ damage.  Today's blood pressure is 138/84.    Lipid Assessment/Plan:      Based on NCEP/ATP III, the patient's risk factor category is "2 or more risk factors and a calculated 10 year CAD risk of < 20%".  From this information, the patient's calculated lipid goals are as follows: Total cholesterol goal is 200; LDL cholesterol goal is 130; HDL cholesterol goal is 40; Triglyceride goal is 150.  His LDL cholesterol goal has not been met.  Secondary causes for hyperlipidemia have been ruled out.  He has been counseled on adjunctive measures for lowering his cholesterol and has been provided with dietary instructions.       Patient Instructions: 1)  Please schedule a follow-up appointment in 4 months. 2)  Check your Blood Pressure regularly. If it is above: 135/85 ON AVERAGE  you should make an appointment. 3)  Hepatic Panel prior to visit, ICD-9:272.2 4)  Lipid Panel prior to visit, ICD-9:995.20. Follow [employer] low carb & walk 30 min 3X/week. Use "No Salt" to season food @ table & increase fruits in diet 5)  BUN,creat,K+ prior to visit, ICD-9: 401.9. Your 2010 Drug Formulary will determine any med changes in 4 months.Most important is TLC as  discussed (diet & exercise)   Prescriptions: LOVAZA 1 GM CAPS (OMEGA-3-ACID ETHYL ESTERS) 2 pills bid  #360 x 1   Entered and Authorized by:   Marga Melnick MD   Signed by:   Marga Melnick MD on 02/10/2008   Method used:   Print then Give to Patient   RxID:   [id number] VIAGRA 100 MG  TABS (SILDENAFIL CITRATE) as needed  #6 Each x 5   Entered and Authorized by:   Marga Melnick MD   Signed by:   Marga Melnick MD on 02/10/2008   Method used:   Print then Give to Patient   RxID:   [id number] BENAZEPRIL HCL 40 MG  TABS (BENAZEPRIL HCL) 1 by mouth qd  #90 x 1   Entered and Authorized by:   Marga Melnick MD   Signed by:   Marga Melnick MD on 02/10/2008   Method used:   Print then Give to Patient   RxID:   [id number] TRIGLIDE 160 MG  TABS (FENOFIBRATE) 1 by mouth qd  #90 x 1   Entered and Authorized by:   Marga Melnick MD   Signed by:   Marga Melnick MD on 02/10/2008   Method used:   Print then Give to Patient   RxID:   [id number] NORVASC 10 MG  TABS (AMLODIPINE BESYLATE) 1 by mouth qd  #90 x 1   Entered and Authorized by:   Marga Melnick MD   Signed by:   Marga Melnick MD on 02/10/2008   Method used:   Print then Give to Patient   RxID:   [id number]  ]

## 2010-05-17 NOTE — Progress Notes (Signed)
Summary: Lab Error-Labs to be Redrawn  Phone Note Outgoing Call   Call placed by: Shonna Chock,  Aug 21, 2008 10:09 AM Call placed to: Patient Summary of Call: Patient was here to Follow-Up and we realized labs results not avaliable: I called [facility] and was told no report to give-no labwork received on patient. I then reviewed documentation in the lab here and it looks as if patient had blood drawn and it was sent to [facility]. Shonna Chock  Aug 21, 2008 10:14 AM   Rene Kocher called the Labs again to follow-up on what happened: Rene Kocher please docoument conversation:    Follow-up for Phone Call        Left message on machine informing patient:   Lab Error on [facility] and we will need to redraw labs. I apologized for the incovnience-labs can be drawn @ [facility] or Here and will need to be fasting. Follow-up by: Shonna Chock,  Aug 21, 2008 10:16 AM  Additional Follow-up for Phone Call Additional follow up Details #1::        spoke with [facility],  they stated orders did not "populate"  over to them.   After searching, they found the blood was correctly sent to them. Additional Follow-up by: Floydene Flock CMA,  Aug 25, 2008 8:39 AM

## 2010-05-17 NOTE — Letter (Signed)
Summary: Results Follow up Letter  [facility] [place]  [address] [place], [place] [address]   Phone: [phone]  Fax: [phone]    01/21/2007 MRN: [id number]  CELEDONIO SORTINO [address] [facility] [place], [place]  [address]  Dear Mr. Schlabach,  The following are the results of your recent test(s):  Test         Result    Pap Smear:        Normal _____  Not Normal _____ Comments: ______________________________________________________ Cholesterol: LDL(Bad cholesterol):         Your goal is less than:         HDL (Good cholesterol):       Your goal is more than: Comments:  ______________________________________________________ Mammogram:        Normal _____  Not Normal _____ Comments:  ___________________________________________________________________ Hemoccult:        Normal _____  Not normal _______ Comments:    _____________________________________________________________________ Other Tests:  Please see attached results and comments   We routinely do not discuss normal results over the telephone.  If you desire a copy of the results, or you have any questions about this information we can discuss them at your next office visit.   Sincerely,

## 2010-05-17 NOTE — Progress Notes (Signed)
Summary: RX Concerns  Phone Note Call from Patient Call back at [phone]   Caller: Patient Summary of Call: pt called to say his rx is costing him $50 for 3 pills, is there a way Dr could write for 90 day to save money supply.Kandice Hams  April 23, 2009 4:10 PM  Initial call taken by: Kandice Hams,  April 23, 2009 4:10 PM  Follow-up for Phone Call         2.5 mg pills qd prn  ; he can pick sample as trialthen fill #90 if desired Follow-up by: Marga Melnick MD,  April 23, 2009 4:18 PM  Additional Follow-up for Phone Call Additional follow up Details #1::        left message to call  office..............Marland KitchenFelecia Deloach CMA  April 26, 2009 10:51 AM     Additional Follow-up for Phone Call Additional follow up Details #2::    Left message on machine informing patient that we will place samples at the front for pick up and if he does ok on the samples to then call and we will forward a RX to the pharmacy. Patient was informed that we are closed today but that he should call tomorrow for hours.Marland KitchenShonna Chock  April 27, 2009 10:31 AM

## 2010-05-17 NOTE — Assessment & Plan Note (Signed)
Summary: constipated & bloated,cbs   Vital Signs:  Patient Profile:   55 Years Old Male Weight:      206.25 pounds Temp:     99.0 degrees F oral Pulse rate:   64 / minute Pulse rhythm:   regular BP sitting:   110 / 64  (left arm) Cuff size:   large  Pt. in pain?   no  Vitals Entered By: Wendall Stade (October 26, 2006 2:48 PM)                Chief Complaint:  constipation and unable to eat.  History of Present Illness: symptoms ongoing for one week, no appetite & constipation. Onset as aching in abd  & constipation  since 7/3. Bloating 7/6 or 7/7. No PMH or FH of GI disease.  On antibiotics for gingivitis X 1 week ending early July. Rx enema , Prep H,& laxative  Current Allergies (reviewed today): No known allergies  Updated/Current Medications (including changes made in today's visit):  NORVASC 10 MG  TABS (AMLODIPINE BESYLATE) 1 by mouth qd TRIGLIDE 160 MG  TABS (FENOFIBRATE) 1 by mouth qd BENAZEPRIL HCL 40 MG  TABS (BENAZEPRIL HCL) 1 by mouth qd METOPROLOL SUCCINATE 100 MG  TB24 (METOPROLOL SUCCINATE) 1 by mouth qd * NIACIN  * ASA 81MG   * VIT E 400IU  * MULTIVITAMIN       Review of Systems  General      Denies chills, fatigue, fever, loss of appetite, malaise, sleep disorder, sweats, weakness, and weight loss.  GI      See HPI      Complains of abdominal pain, change in bowel habits, constipation, gas, hemorrhoids, indigestion, loss of appetite, and nausea.      Denies bloody stools, dark tarry stools, diarrhea, vomiting, vomiting blood, and yellowish skin color.      all stool soft;no clay colored stool  GU      Denies dysuria, hematuria, and urinary frequency.      no cokecolored urine   Physical Exam  General:     Well-developed,well-nourished,in no acute distress; alert,appropriate and cooperative throughout examination Eyes:     no icterus Lungs:     Normal respiratory effort, chest expands symmetrically. Lungs are clear to auscultation, no  crackles or wheezes. Heart:     S4 Abdomen:     abd distended with  decreased bowel sounds, dullness RUQ, no clinical ileus Rectal:     No external abnormalities noted. Normal sphincter tone. No rectal masses or tenderness.Stool sl lite , FOB neg Prostate:     Prostate gland firm and smooth, no enlargement, nodularity, tenderness, mass, asymmetry or induration.    Impression & Recommendations:  Problem # 1:  CONSTIPATION (ICD-564.0)  Orders: TLB-TSH (Thyroid Stimulating Hormone) (84443-TSH)   Medications Added to Medication List This Visit: 1)  Norvasc 10 Mg Tabs (Amlodipine besylate) .Marland Kitchen.. 1 by mouth qd 2)  Triglide 160 Mg Tabs (Fenofibrate) .Marland Kitchen.. 1 by mouth qd 3)  Benazepril Hcl 40 Mg Tabs (Benazepril hcl) .Marland Kitchen.. 1 by mouth qd 4)  Metoprolol Succinate 100 Mg Tb24 (Metoprolol succinate) .Marland Kitchen.. 1 by mouth qd 5)  Niacin  6)  Asa 81mg   7)  Vit E 400iu  8)  Multivitamin   Other Orders: Venipuncture ([address]) TLB-CBC Platelet - w/Differential (85025-CBCD)   Patient Instructions: 1)  Take ALIGN once daily ; stay on clear liquid diet & use Miralax as per package. 2)  Drink clear liquids only for the next 24 hours, then  slowly add other liquids and food as you  tolerate them.         Appended Document: Orders Update    Clinical Lists Changes  Problems: Added new problem of CONSTIPATION (ICD-564.00) Orders: Added new Test order of T-CBC w/Diff [phone]) - Signed Added new Test order of T-TSH [phone]) - Signed

## 2010-05-17 NOTE — Letter (Signed)
Summary: Primary Care Consult Scheduled Letter  [facility] [place]  [address] [place], [place] [address]   Phone: [phone]  Fax: [phone]      02/10/2008 MRN: [id number]  KHYRAN RIERA [address] [facility] [place], [place]  [address]    Dear Mr. Toya,      We have scheduled an appointment for you.  At the recommendation of Dr.Hopper, we have scheduled you a consult with Dr.Gessner on Nov 3 at 9:30 am and Nov 17th at 10:30am check in at 9:30. Their address is_[address]. The office phone number is [phone]. If this appointment day and time is not convenient for you, please feel free to call the office of the doctor you are being referred to at the number listed above and reschedule the appointment.     It is important for you to keep your scheduled appointments. We are here to make sure you are given good patient care. If you have questions or you have made changes to your appointment, please notify us at  [phone], ask for Tiffany.    Thank you,  Patient Care Coordinator [facility] at [facility]

## 2010-05-19 NOTE — Assessment & Plan Note (Signed)
Summary: cpx/lch   Vital Signs:  Patient profile:   55 year old male Height:      69 inches (175.26 cm) Weight:      218 pounds (99.09 kg) BMI:     32.31 Temp:     98.5 degrees F (36.94 degrees C) oral Resp:     14 per minute BP sitting:   150 / 100  (right arm)  Vitals Entered By: Lucious Groves CMA (April 21, 2010 12:49 PM) CC: CPX and discuss labs./kb, Lipid Management   CC:  CPX and discuss labs./kb and Lipid Management.  History of Present Illness:    Tim Decker is here for  a physical; he is asymptomatic.    Hypertension Follow-Up:  The patient reports urinary frequency, but denies lightheadedness, headaches, edema, and fatigue.  The patient denies the following associated symptoms: chest pain, chest pressure, exercise intolerance, dyspnea, palpitations, and syncope.  Compliance with medications (by patient report) has been near 100%.  The patient reports that dietary compliance has been fair over holidays.  The patient reports exercising 3 X per week.  Adjunctive measures currently used by the patient include salt restriction.  BP @ home 135/90 on average.    Hyperlipidemia Follow-Up: The patient denies muscle aches, GI upset, abdominal pain, flushing, itching, constipation, and diarrhea.  Compliance with medications (by patient report) has been near 100%.  Adjunctive measures currently used by the patient include ASA, niacin, and fish oil supplements. He has not been on Trilipix. Labs reviewed ; TG up from 169 to 367.  Lipid Management History:      Positive NCEP/ATP III risk factors include male age 72 years old or older, HDL cholesterol less than 40, family history for ischemic heart disease (males less than 29 years old), and hypertension.  Negative NCEP/ATP III risk factors include non-diabetic, non-tobacco-user status, no ASHD (atherosclerotic heart disease), no prior stroke/TIA, no peripheral vascular disease, and no history of aortic aneurysm.     Preventive  Screening-Counseling & Management  Alcohol-Tobacco     Smoking Status: quit  Current Medications (verified): 1)  Norvasc 10 Mg  Tabs (Amlodipine Besylate) .Marland Kitchen.. 1 By Mouth Once Daily **appointment Due** 2)  Benazepril Hcl 40 Mg  Tabs (Benazepril Hcl) .Marland Kitchen.. 1 By Mouth Qd 3)  Niacin 500 Mg Tabs (Niacin) .Marland Kitchen.. 1 By Mouth Once Daily 4)  Adult Aspirin Low Strength 81 Mg Tbdp (Aspirin) .Marland Kitchen.. 1 By Mouth Once Daily 5)  Vitamin E 400 Unit Caps (Vitamin E) .Marland Kitchen.. 1 By Mouth Once Daily 6)  Multivitamins  Tabs (Multiple Vitamin) .Marland Kitchen.. 1 By Mouth Once Daily 7)  Fish Oil 1000 Mg Caps (Omega-3 Fatty Acids) .Marland Kitchen.. 1 By Mouth Once Daily 8)  Trilipix 135 Mg Cpdr (Choline Fenofibrate) .Marland Kitchen.. 1 Once Daily, **appointment Due** 9)  Crestor 10 Mg Tabs (Rosuvastatin Calcium) .Marland Kitchen.. 1 M,w, F **appointment Due** 10)  Potassium Chloride Cr 10 Meq Cr-Caps (Potassium Chloride) .... **appointment Due**  1 Once Daily **appointment Due** 11)  Cialis 20 Mg Tabs (Tadalafil) .Marland Kitchen.. 1 Q 3 Days As Needed  Allergies (verified): No Known Drug Allergies  Past History:  Past Medical History: Hyperlipidemia: [place] Study LDL goal = < 130. Hypertension Dysmetabolic syndrome disorder(Pre Diabetes) Prostatic hyperplasia w/o urinary obstruction Erectile dysfunction Colonic polyps,HYPERPLASTIC , PMH  of 2009, Dr Johnathan Hausen fracture @ age 41 Nephrolithiasis, hx of 2004  Past Surgical History: Tonsillectomy Colon polypectomy  Family History: Father: died of  MI  @  58 Mother: DM Siblings: negative ;  PGF: MI @ ?  Social History: Former Smoker quit :02/2000 Occupation: Actor Single Alcohol use-yes  Review of Systems  The patient denies anorexia, fever, vision loss, decreased hearing, hoarseness, prolonged cough, hemoptysis, melena, hematochezia, severe indigestion/heartburn, hematuria, suspicious skin lesions, depression, unusual weight change, abnormal bleeding, enlarged lymph nodes, and angioedema.          Weight up 20# over 4 months  Physical Exam  General:  well-nourished;alert,appropriate and cooperative throughout examination Head:  Normocephalic and atraumatic without obvious abnormalities. Pattern  alopecia ; goatee. Eyes:  No corneal or conjunctival inflammation noted. EOMI. Perrla. Funduscopic exam benign, without hemorrhages, exudates or papilledema. Vision grossly normal. Ears:  External ear exam shows no significant lesions or deformities.  Otoscopic examination reveals clear canals, tympanic membranes are intact bilaterally without bulging, retraction, inflammation or discharge. Hearing is grossly normal bilaterally. Nose:  External nasal examination shows no deformity or inflammation. Nasal mucosa are pink and moist without lesions or exudates. Slight septal dislocation Mouth:  Oral mucosa and oropharynx without lesions or exudates.  Teeth in good repair. Neck:  No deformities, masses, or tenderness noted. Lungs:  Normal respiratory effort, chest expands symmetrically. Lungs are clear to auscultation, no crackles or wheezes. Heart:  Normal rate and regular rhythm. S1 and S2 normal without gallop, murmur, click, rub .S4 with slurring Abdomen:  Bowel sounds positive,abdomen soft and non-tender without masses, organomegaly .Slight ventral hernia  noted. Rectal:  No external abnormalities noted. Normal sphincter tone. No rectal masses or tenderness. Genitalia:  Testes bilaterally descended without nodularity, tenderness or masses. No scrotal masses or lesions. No penis lesions or urethral discharge. Large L varicocele.   Prostate:  Prostate gland firm and smooth, mild  enlargement w/o  nodularity, tenderness, mass, asymmetry or induration. Msk:  No deformity or scoliosis noted of thoracic or lumbar spine.   Pulses:  R and L carotid,radial,dorsalis pedis and posterior tibial pulses are full and equal bilaterally Extremities:  No clubbing, cyanosis, edema, or deformity noted with normal full  range of motion of all joints.   Neurologic:  alert & oriented X3 and DTRs symmetrical and normal.   Skin:  Intact without suspicious lesions or rashes Cervical Nodes:  No lymphadenopathy noted Axillary Nodes:  No palpable lymphadenopathy Psych:  memory intact for recent and remote, normally interactive, and good eye contact.     Impression & Recommendations:  Problem # 1:  ROUTINE GENERAL MEDICAL EXAM@HEALTH  CARE FACL (ICD-V70.0)  Orders: EKG w/ Interpretation (93000) Venipuncture (38756) Specimen Handling (43329) TLB-TSH (Thyroid Stimulating Hormone) (84443-TSH) TLB-Creatinine, Blood (82565-CREA) TLB-Potassium (K+) (84132-K) TLB-BUN (Urea Nitrogen) (84520-BUN) TLB-PSA (Prostate Specific Antigen) (84153-PSA)  Problem # 2:  HYPERLIPIDEMIA (ICD-272.2)  The following medications were removed from the medication list:    Crestor 10 Mg Tabs (Rosuvastatin calcium) .Marland Kitchen... 1 m,w, f **appointment due** His updated medication list for this problem includes:    Niacin 500 Mg Tabs (Niacin) .Marland Kitchen... 1 by mouth once daily    Trilipix 135 Mg Cpdr (Choline fenofibrate) .Marland Kitchen... 1 once daily    Pravastatin Sodium 20 Mg Tabs (Pravastatin sodium) .Marland Kitchen... 1 at bedtime  Problem # 3:  DISORDER, DYSMETABOLIC SYNDROME X (ICD-277.7)  Problem # 4:  HYPERTENSION, ESSENTIAL NOS (ICD-401.9)  His updated medication list for this problem includes:    Norvasc 10 Mg Tabs (Amlodipine besylate) .Marland Kitchen... 1 by mouth once daily    Benazepril Hcl 40 Mg Tabs (Benazepril hcl) .Marland Kitchen... 1 by mouth qd    Doxazosin Mesylate 2 Mg Tabs (Doxazosin  mesylate) .Marland Kitchen... 1 once daily  Problem # 5:  ISCHEMIC HEART DISEASE, PREMATURE, FAMILY HX (ICD-V17.3)  Complete Medication List: 1)  Norvasc 10 Mg Tabs (Amlodipine besylate) .Marland Kitchen.. 1 by mouth once daily 2)  Benazepril Hcl 40 Mg Tabs (Benazepril hcl) .Marland Kitchen.. 1 by mouth qd 3)  Niacin 500 Mg Tabs (Niacin) .Marland Kitchen.. 1 by mouth once daily 4)  Adult Aspirin Low Strength 81 Mg Tbdp (Aspirin) .Marland Kitchen.. 1 by  mouth once daily 5)  Vitamin E 400 Unit Caps (Vitamin e) .Marland Kitchen.. 1 by mouth once daily 6)  Multivitamins Tabs (Multiple vitamin) .Marland Kitchen.. 1 by mouth once daily 7)  Fish Oil 1000 Mg Caps (Omega-3 fatty acids) .Marland Kitchen.. 1 by mouth once daily 8)  Trilipix 135 Mg Cpdr (Choline fenofibrate) .Marland Kitchen.. 1 once daily 9)  Potassium Chloride Cr 10 Meq Cr-caps (Potassium chloride) .Marland Kitchen.. 1 once daily 10)  Cialis 20 Mg Tabs (Tadalafil) .Marland Kitchen.. 1 q 3 days as needed 11)  Doxazosin Mesylate 2 Mg Tabs (Doxazosin mesylate) .Marland Kitchen.. 1 once daily 12)  Pravastatin Sodium 20 Mg Tabs (Pravastatin sodium) .Marland Kitchen.. 1 at bedtime  Other Orders: Tdap => 46yrs IM (16109) Admin 1st Vaccine (60454)  Lipid Assessment/Plan:      Based on NCEP/ATP III, the patient's risk factor category is "0-1 risk factors".  The patient's lipid goals are as follows: Total cholesterol goal is 200; LDL cholesterol goal is 130; HDL cholesterol goal is 40; Triglyceride goal is 150.  His LDL cholesterol goal has not been met.  Secondary causes for hyperlipidemia have been ruled out.  He has been counseled on adjunctive measures for lowering his cholesterol and has been provided with dietary instructions.    Patient Instructions: 1)  Avoid HFCS sugar as discussed, < 40 grams per day. 2)  It is important that you exercise regularly at least 20 minutes 5 times a week. If you develop chest pain, have severe difficulty breathing, or feel very tired , stop exercising immediately and seek medical attention. 3)  Take an Aspirin every day. 4)  Please schedule a follow-up appointment in 4 months. 5)  Hepatic Panel; 6)  [place] [facility] Lipid Panel (1304X)  ;& 7)  HbgA1C prior to visit. Prescriptions: POTASSIUM CHLORIDE CR 10 MEQ CR-CAPS (POTASSIUM CHLORIDE) 1 once daily  #90 x 1   Entered and Authorized by:   Marga Melnick MD   Signed by:   Marga Melnick MD on 04/21/2010   Method used:   Print then Give to Patient   RxID:   [id number] PRAVASTATIN SODIUM 20 MG TABS  (PRAVASTATIN SODIUM) 1 at bedtime  #90 x 1   Entered and Authorized by:   Marga Melnick MD   Signed by:   Marga Melnick MD on 04/21/2010   Method used:   Print then Give to Patient   RxID:   [id number] DOXAZOSIN MESYLATE 2 MG TABS (DOXAZOSIN MESYLATE) 1 once daily  #30 x 5   Entered and Authorized by:   Marga Melnick MD   Signed by:   Marga Melnick MD on 04/21/2010   Method used:   Print then Give to Patient   RxID:   [id number] TRILIPIX 135 MG CPDR (CHOLINE FENOFIBRATE) 1 once daily  #30 x 5   Entered and Authorized by:   Marga Melnick MD   Signed by:   Marga Melnick MD on 04/21/2010   Method used:   Print then Give to Patient   RxID:   [id number] BENAZEPRIL HCL 40 MG  TABS (BENAZEPRIL HCL) 1 by  mouth qd  #90 x 3   Entered and Authorized by:   Marga Melnick MD   Signed by:   Marga Melnick MD on 04/21/2010   Method used:   Print then Give to Patient   RxID:   [id number] NORVASC 10 MG  TABS (AMLODIPINE BESYLATE) 1 by mouth once daily  #90 x 3   Entered and Authorized by:   Marga Melnick MD   Signed by:   Marga Melnick MD on 04/21/2010   Method used:   Print then Give to Patient   RxID:   [id number]    Orders Added: 1)  Est. Patient 40-64 years [99396] 2)  EKG w/ Interpretation [93000] 3)  Venipuncture [36415] 4)  Tdap => 23yrs IM [90715] 5)  Admin 1st Vaccine [90471] 6)  Specimen Handling [99000] 7)  TLB-TSH (Thyroid Stimulating Hormone) [84443-TSH] 8)  TLB-Creatinine, Blood [82565-CREA] 9)  TLB-Potassium (K+) [84132-K] 10)  TLB-BUN (Urea Nitrogen) [84520-BUN] 11)  TLB-PSA (Prostate Specific Antigen) [95284-XLK]   Immunizations Administered:  Tetanus Vaccine:    Vaccine Type: Tdap    Site: right deltoid    Mfr: GlaxoSmithKline    Dose: 0.5 ml    Route: IM    Given by: Shonna Chock CMA    Exp. Date: 02/04/2012    Lot #: [id number]    VIS given: 03/04/08 version given April 21, 2010.   Immunizations Administered:  Tetanus  Vaccine:    Vaccine Type: Tdap    Site: right deltoid    Mfr: GlaxoSmithKline    Dose: 0.5 ml    Route: IM    Given by: Shonna Chock CMA    Exp. Date: 02/04/2012    Lot #: [id number]    VIS given: 03/04/08 version given April 21, 2010.

## 2010-07-06 ENCOUNTER — Other Ambulatory Visit: Payer: Self-pay | Admitting: Internal Medicine

## 2010-07-06 NOTE — Telephone Encounter (Signed)
Dr.Hopper please advise, med was removed from med list in old EMR system(cost). Would you like to re prescribe

## 2010-07-06 NOTE — Telephone Encounter (Signed)
Renew x 12 months

## 2010-08-18 ENCOUNTER — Other Ambulatory Visit (INDEPENDENT_AMBULATORY_CARE_PROVIDER_SITE_OTHER): Payer: BC Managed Care – PPO

## 2010-08-18 DIAGNOSIS — E785 Hyperlipidemia, unspecified: Secondary | ICD-10-CM

## 2010-08-18 LAB — HEMOGLOBIN A1C: Hgb A1c MFr Bld: 5.6 % (ref 4.6–6.5)

## 2010-08-25 ENCOUNTER — Ambulatory Visit: Payer: Self-pay | Admitting: Internal Medicine

## 2010-09-07 ENCOUNTER — Encounter: Payer: Self-pay | Admitting: Internal Medicine

## 2010-09-22 ENCOUNTER — Ambulatory Visit (INDEPENDENT_AMBULATORY_CARE_PROVIDER_SITE_OTHER): Payer: BC Managed Care – PPO | Admitting: Internal Medicine

## 2010-09-22 ENCOUNTER — Encounter: Payer: Self-pay | Admitting: Internal Medicine

## 2010-09-22 DIAGNOSIS — I10 Essential (primary) hypertension: Secondary | ICD-10-CM

## 2010-09-22 DIAGNOSIS — E782 Mixed hyperlipidemia: Secondary | ICD-10-CM

## 2010-09-22 DIAGNOSIS — E8881 Metabolic syndrome: Secondary | ICD-10-CM

## 2010-09-22 MED ORDER — METFORMIN HCL ER 500 MG PO TB24: 500.0000 mg | ORAL_TABLET | Freq: Every day | ORAL | Status: DC

## 2010-09-22 NOTE — Progress Notes (Signed)
  Subjective:    Patient ID: Tim Decker, male    DOB: 12-19-1955, 55 y.o.   MRN: [id number]  HPI Dyslipidemia assessment: Prior Advanced Lipid Testing: NMR  2005 but TG 493.   Family history of premature CAD/ MI: father @ 33 .  Nutrition: no .  Exercise: walking 2-3X/ week . Diabetes : A1c 5.6%  But Insulin & TG are elevated, a Metabolic Syndrome pattern  HTN: controlled. Smoking history  : quit 2006 .   Weight :  stable. ROS: fatigue: no ; chest pain : no ;claudication: no; palpitations: no; abd pain/bowel changes: no ; myalgias:no;  syncope : no ; memory loss: no;skin changes: no. Lab results reviewed :[place] panel: TG 270, insulin 33 , ie Metabolic Syndrome.     Review of Systems     Objective:   Physical Exam Gen.: Healthy and well-nourished in appearance. Alert, appropriate and cooperative throughout exam. Head: Normocephalic without obvious abnormalities;  pattern alopecia  Eyes: No corneal or conjunctival inflammation noted.  No icterus Neck: No deformities, masses, or tenderness noted.  Thyroid normal. Lungs: Normal respiratory effort; chest expands symmetrically. Lungs are clear to auscultation without rales, wheezes, or increased work of breathing. Heart: Normal rate and rhythm. Normal S1 and S2. No gallop, click, or rub. S4 w/o murmur. Abdomen: Bowel sounds normal; abdomen soft and nontender. No masses, organomegaly or hernias noted but protuberant. No clubbing, cyanosis, edema, or deformity noted.  Nail health  good. Vascular: Carotid, radial artery, dorsalis pedis and dorsalis posterior tibial pulses are full and equal. No bruits present. Neurologic: Alert and oriented x3. Deep tendon reflexes symmetrical and normal.          Skin: Intact without suspicious lesions or rashes. Lymph: No cervical, axillary, or inguinal lymphadenopathy present. Psych: Mood and affect are normal. Normally interactive                                                                                          Assessment & Plan:  #1 Metabolic Syndrome #2 Dyslidemia Plan: in depth discussion of pathophysiology of Metabolic Syndrome & treatment options.[place] Heart panel reviewed in detail. Metformin Rxed

## 2010-09-22 NOTE — Patient Instructions (Signed)
Preventive Health Care: Exercise at least 30-45 minutes a day,  3-4 days a week.    The most common cause of elevated triglycerides is the ingestion of sugar from high fructose corn syrup sources. You should consume less than 40 grams  of sugar per day from foods and drinks with high fructose corn syrup as number 2, 3, or #4 on the label. As TG go up, HDL or good cholesterol goes down. Also uric acid which causes gout will go up.   Please  schedule fasting Labs in 12 weeks : Lipids (277.7)

## 2010-10-05 ENCOUNTER — Other Ambulatory Visit: Payer: Self-pay | Admitting: Internal Medicine

## 2010-11-04 ENCOUNTER — Other Ambulatory Visit: Payer: Self-pay | Admitting: Internal Medicine

## 2010-12-20 ENCOUNTER — Other Ambulatory Visit: Payer: Self-pay | Admitting: Internal Medicine

## 2010-12-21 ENCOUNTER — Telehealth: Payer: Self-pay | Admitting: Internal Medicine

## 2010-12-21 NOTE — Telephone Encounter (Signed)
Discuss with patient, card mailed to Pt

## 2010-12-22 ENCOUNTER — Other Ambulatory Visit: Payer: BC Managed Care – PPO

## 2010-12-29 ENCOUNTER — Ambulatory Visit: Payer: BC Managed Care – PPO | Admitting: Internal Medicine

## 2011-01-11 ENCOUNTER — Other Ambulatory Visit: Payer: Self-pay | Admitting: Internal Medicine

## 2011-01-12 NOTE — Telephone Encounter (Signed)
It would be best to fill the prescription for whichever medication is more effective. Cialis is used in 3 days as needed. Viagra can be used daily as needed.

## 2011-01-12 NOTE — Telephone Encounter (Signed)
Dr.Hopper please advise, patient requesting Viagra and Cialis : Last OV 09/22/10, pending appointment 02/21/11

## 2011-01-13 ENCOUNTER — Other Ambulatory Visit: Payer: Self-pay | Admitting: Internal Medicine

## 2011-01-13 NOTE — Telephone Encounter (Signed)
DUPLICATE DUPLICATE, Cialis was filled

## 2011-01-13 NOTE — Telephone Encounter (Signed)
Refill viagra or cialis -patient has used both - [facility] - [address]

## 2011-01-17 ENCOUNTER — Other Ambulatory Visit: Payer: BC Managed Care – PPO

## 2011-01-24 ENCOUNTER — Ambulatory Visit: Payer: BC Managed Care – PPO | Admitting: Internal Medicine

## 2011-02-14 ENCOUNTER — Other Ambulatory Visit: Payer: BC Managed Care – PPO

## 2011-02-21 ENCOUNTER — Ambulatory Visit: Payer: BC Managed Care – PPO | Admitting: Internal Medicine

## 2011-03-20 ENCOUNTER — Other Ambulatory Visit: Payer: Self-pay | Admitting: Internal Medicine

## 2011-03-20 DIAGNOSIS — E8881 Metabolic syndrome: Secondary | ICD-10-CM

## 2011-03-21 ENCOUNTER — Other Ambulatory Visit: Payer: BC Managed Care – PPO

## 2011-03-22 ENCOUNTER — Other Ambulatory Visit: Payer: Self-pay | Admitting: Internal Medicine

## 2011-03-24 ENCOUNTER — Telehealth: Payer: Self-pay | Admitting: Internal Medicine

## 2011-03-24 NOTE — Telephone Encounter (Signed)
Lipid/Hep/BMP/CBCD/TSH/Stool Cards/Udip v70.0/995.20/272.4/401.9

## 2011-03-24 NOTE — Telephone Encounter (Signed)
Patient has appt for cpx [id number] - lab [phone] need lab order

## 2011-03-28 ENCOUNTER — Ambulatory Visit: Payer: BC Managed Care – PPO | Admitting: Internal Medicine

## 2011-04-19 ENCOUNTER — Other Ambulatory Visit: Payer: Self-pay | Admitting: Internal Medicine

## 2011-06-16 ENCOUNTER — Other Ambulatory Visit: Payer: Self-pay | Admitting: Internal Medicine

## 2011-06-16 ENCOUNTER — Other Ambulatory Visit: Payer: BC Managed Care – PPO

## 2011-06-16 DIAGNOSIS — T887XXA Unspecified adverse effect of drug or medicament, initial encounter: Secondary | ICD-10-CM

## 2011-06-16 DIAGNOSIS — E785 Hyperlipidemia, unspecified: Secondary | ICD-10-CM

## 2011-06-16 DIAGNOSIS — Z0000 Encounter for general adult medical examination without abnormal findings: Secondary | ICD-10-CM

## 2011-06-16 DIAGNOSIS — I10 Essential (primary) hypertension: Secondary | ICD-10-CM

## 2011-06-19 ENCOUNTER — Other Ambulatory Visit (INDEPENDENT_AMBULATORY_CARE_PROVIDER_SITE_OTHER): Payer: BC Managed Care – PPO

## 2011-06-19 DIAGNOSIS — E785 Hyperlipidemia, unspecified: Secondary | ICD-10-CM

## 2011-06-19 DIAGNOSIS — I10 Essential (primary) hypertension: Secondary | ICD-10-CM

## 2011-06-19 DIAGNOSIS — T887XXA Unspecified adverse effect of drug or medicament, initial encounter: Secondary | ICD-10-CM

## 2011-06-19 DIAGNOSIS — Z0000 Encounter for general adult medical examination without abnormal findings: Secondary | ICD-10-CM

## 2011-06-19 LAB — CBC WITH DIFFERENTIAL/PLATELET
Basophils Absolute: 0 10*3/uL (ref 0.0–0.1)
Basophils Relative: 0.6 % (ref 0.0–3.0)
Eosinophils Absolute: 0.2 10*3/uL (ref 0.0–0.7)
Eosinophils Relative: 2.3 % (ref 0.0–5.0)
HCT: 43.9 % (ref 39.0–52.0)
Hemoglobin: 14.7 g/dL (ref 13.0–17.0)
Lymphocytes Relative: 10.8 % — ABNORMAL LOW (ref 12.0–46.0)
Lymphs Abs: 0.7 10*3/uL (ref 0.7–4.0)
MCHC: 33.5 g/dL (ref 30.0–36.0)
MCV: 90.1 fl (ref 78.0–100.0)
Monocytes Absolute: 0.7 10*3/uL (ref 0.1–1.0)
Monocytes Relative: 9.7 % (ref 3.0–12.0)
Neutro Abs: 5.2 10*3/uL (ref 1.4–7.7)
Neutrophils Relative %: 76.6 % (ref 43.0–77.0)
Platelets: 278 10*3/uL (ref 150.0–400.0)
RBC: 4.87 Mil/uL (ref 4.22–5.81)
RDW: 13.7 % (ref 11.5–14.6)
WBC: 6.8 10*3/uL (ref 4.5–10.5)

## 2011-06-19 LAB — BASIC METABOLIC PANEL
BUN: 16 mg/dL (ref 6–23)
CO2: 28 mEq/L (ref 19–32)
Calcium: 9.5 mg/dL (ref 8.4–10.5)
Chloride: 103 mEq/L (ref 96–112)
Creatinine, Ser: 1.2 mg/dL (ref 0.4–1.5)
GFR: 68.48 mL/min (ref >60.00)
Glucose, Bld: 95 mg/dL (ref 70–99)
Potassium: 3.5 mEq/L (ref 3.5–5.1)
Sodium: 140 mEq/L (ref 135–145)

## 2011-06-19 LAB — HEPATIC FUNCTION PANEL
ALT: 28 U/L (ref 0–53)
AST: 23 U/L (ref 0–37)
Albumin: 4.4 g/dL (ref 3.5–5.2)
Alkaline Phosphatase: 29 U/L — ABNORMAL LOW (ref 39–117)
Bilirubin, Direct: 0 mg/dL (ref 0.0–0.3)
Total Bilirubin: 0.6 mg/dL (ref 0.3–1.2)
Total Protein: 7.2 g/dL (ref 6.0–8.3)

## 2011-06-19 LAB — URINALYSIS, ROUTINE W REFLEX MICROSCOPIC
Bilirubin Urine: NEGATIVE
Hgb urine dipstick: NEGATIVE
Ketones, ur: NEGATIVE
Leukocytes, UA: NEGATIVE
Nitrite: NEGATIVE
Specific Gravity, Urine: 1.015 (ref 1.000–1.030)
Total Protein, Urine: 30
Urine Glucose: NEGATIVE
Urobilinogen, UA: 1 (ref 0.0–1.0)
pH: 6.5 (ref 5.0–8.0)

## 2011-06-19 LAB — LIPID PANEL
Cholesterol: 174 mg/dL (ref 0–200)
HDL: 33.4 mg/dL — ABNORMAL LOW (ref >39.00)
Total CHOL/HDL Ratio: 5
Triglycerides: 209 mg/dL — ABNORMAL HIGH (ref 0.0–149.0)
VLDL: 41.8 mg/dL — ABNORMAL HIGH (ref 0.0–40.0)

## 2011-06-19 LAB — TSH: TSH: 1.31 u[IU]/mL (ref 0.35–5.50)

## 2011-06-19 LAB — LDL CHOLESTEROL, DIRECT: Direct LDL: 106.2 mg/dL

## 2011-06-23 ENCOUNTER — Encounter: Payer: BC Managed Care – PPO | Admitting: Internal Medicine

## 2011-06-26 ENCOUNTER — Encounter: Payer: BC Managed Care – PPO | Admitting: Internal Medicine

## 2011-07-06 ENCOUNTER — Ambulatory Visit (INDEPENDENT_AMBULATORY_CARE_PROVIDER_SITE_OTHER): Payer: BC Managed Care – PPO | Admitting: Internal Medicine

## 2011-07-06 ENCOUNTER — Encounter: Payer: Self-pay | Admitting: Internal Medicine

## 2011-07-06 VITALS — BP 134/90 | HR 74 | Temp 97.9°F | Resp 14 | Ht 69.5 in | Wt 218.8 lb

## 2011-07-06 DIAGNOSIS — Z0000 Encounter for general adult medical examination without abnormal findings: Secondary | ICD-10-CM

## 2011-07-06 DIAGNOSIS — Z1289 Encounter for screening for malignant neoplasm of other sites: Secondary | ICD-10-CM

## 2011-07-06 NOTE — Progress Notes (Signed)
  Subjective:    Patient ID: Tim Decker, male    DOB: Feb 07, 1956, 56 y.o.   MRN: [id number]  HPI  Tim Decker  is here for a physical; he denies acute issues.      Review of Systems HYPERTENSION: Disease Monitoring: Blood pressure range-135-150/85-90 Chest pain, palpitations-no      Dyspnea- no Medications: Compliance- no  Lightheadedness,Syncope-no   Edema- no  FASTING HYPERGLYCEMIA: Disease Monitoring: Blood Sugar ranges- Not monitored Polyuria/phagia/dipsia-no       Visual problems-no Medications: Compliance- ? On Metformin Hypoglycemic symptoms- no  HYPERLIPIDEMIA: Disease Monitoring: See symptoms for Hypertension Medications: Compliance- unsure of stain name  Abd pain, bowel changes- no   Muscle aches-no       Objective:   Physical Exam Gen.: Healthy and well-nourished in appearance. Alert, appropriate and cooperative throughout exam. Head: Normocephalic without obvious abnormalities;pattern alopecia ; goatee Eyes: No corneal or conjunctival inflammation noted. Pupils equal round reactive to light and accommodation. Fundal exam is benign without hemorrhages, exudate, papilledema. Extraocular motion intact. Vision grossly normal. Ears: External  ear exam reveals no significant lesions or deformities. Canals clear .TMs normal. Hearing is grossly normal bilaterally. Nose: External nasal exam reveals no deformity or inflammation. Nasal mucosa are pink and moist. No lesions or exudates noted.  Mouth: Oral mucosa and oropharynx reveal no lesions or exudates. Teeth in good repair. Neck: No deformities, masses, or tenderness noted. Range of motion & Thyroid normal. Lungs: Normal respiratory effort; chest expands symmetrically. Lungs are clear to auscultation without rales, wheezes, or increased work of breathing. Heart: Normal rate and rhythm. Normal S1 and S2. No gallop, click, or rub. S4 without murmur. Abdomen: Bowel sounds normal; abdomen soft and nontender. No  masses, organomegaly or hernias noted. Genitalia/ DRE: Large varices on the left. The left lobe of the prostate is small; the right lobe is upper limits of normal without nodularity or induration. ( Note: on Doxazosin ; PSA 0.62 on 04/21/2010)                     Musculoskeletal/extremities: No deformity or scoliosis noted of  the thoracic or lumbar spine. No clubbing, cyanosis, edema, or deformity noted. Range of motion  normal .Tone & strength  normal.Joints normal; slight crepitus of knees. Nail health  good. Vascular: Carotid, radial artery, dorsalis pedis and  posterior tibial pulses are full and equal. No bruits present. Neurologic: Alert and oriented x3. Deep tendon reflexes symmetrical and normal.          Skin: Intact without suspicious lesions or rashes. Lymph: No cervical, axillary, or inguinal lymphadenopathy present. Psych: Mood and affect are normal. Normally interactive                                                                                         Assessment & Plan:  #1 comprehensive physical exam; no acute findings #2 see Problem List with Assessments & Recommendations Plan: see Orders

## 2011-07-06 NOTE — Patient Instructions (Addendum)
Preventive Health Care: Exercise at least 30-45 minutes a day,  3-4 days a week.  Eat a low-fat diet with lots of fruits and vegetables, up to 7-9 servings per day. Avoid obesity; your goal is waist measurement < 40 inches.Consume less than 40 grams (preferably ZERO) of sugar per day from foods & drinks with High Fructose Corn Sugar as #1,2,3 or # 4 on label as discussed Alcohol If you drink, do it moderately,less than 9 drinks per week, preferably less than 6 @ most. Health Care Power of Attorney & Living Will. Complete if not in place ; these place you in charge of your health care decisions. Blood Pressure Goal  Ideally is an AVERAGE < 135/85. This AVERAGE should be calculated from @ least 5-7 BP readings taken @ different times of day on different days of week. You should not respond to isolated BP readings , but rather the AVERAGE for that week  Please verify name and doses of all medicines.

## 2011-07-07 LAB — POC HEMOCCULT BLD/STL (OFFICE/1-CARD/DIAGNOSTIC): Fecal Occult Blood, POC: NEGATIVE

## 2011-07-07 NOTE — Progress Notes (Signed)
Addended by: Silvio Pate D on: 07/07/2011 02:36 PM   Modules accepted: Orders

## 2011-07-10 ENCOUNTER — Telehealth: Payer: Self-pay

## 2011-07-10 MED ORDER — ROSUVASTATIN CALCIUM 20 MG PO TABS: 20.0000 mg | ORAL_TABLET | Freq: Every day | ORAL | Status: DC

## 2011-07-10 NOTE — Telephone Encounter (Signed)
Patient was to review medications when he went home from last OV and call or fax update on which cholesterol medication he was taking, patient faxed a paper stating he is taking pravastatin and crestor. (paper sent for scanning)   Per Dr.Hopper patient to take Crestor 20 mg once daily only and d/c pravastatin.  I called patient at [phone] and informed him of these changes, new rx to be sent to [facility] [address]

## 2011-08-09 ENCOUNTER — Other Ambulatory Visit: Payer: Self-pay | Admitting: Internal Medicine

## 2011-08-14 ENCOUNTER — Encounter: Payer: Self-pay | Admitting: Internal Medicine

## 2011-08-14 ENCOUNTER — Ambulatory Visit (INDEPENDENT_AMBULATORY_CARE_PROVIDER_SITE_OTHER): Payer: BC Managed Care – PPO | Admitting: Internal Medicine

## 2011-08-14 VITALS — BP 160/96 | HR 98 | Temp 97.7°F | Wt 214.0 lb

## 2011-08-14 DIAGNOSIS — I10 Essential (primary) hypertension: Secondary | ICD-10-CM

## 2011-08-14 DIAGNOSIS — J3489 Other specified disorders of nose and nasal sinuses: Secondary | ICD-10-CM

## 2011-08-14 MED ORDER — FLUTICASONE PROPIONATE 50 MCG/ACT NA SUSP: 1.0000 | Freq: Two times a day (BID) | NASAL | Status: DC | PRN

## 2011-08-14 MED ORDER — HYDROCHLOROTHIAZIDE 12.5 MG PO CAPS: 12.5000 mg | ORAL_CAPSULE | Freq: Every day | ORAL | Status: DC

## 2011-08-14 NOTE — Patient Instructions (Signed)
Nasal cleansing in the shower as discussed. Make sure that all residual soap is removed to prevent irritation. Fluticasone  1 spray in each nostril twice a day as needed. Use the "crossover" technique as discussed  Blood Pressure Goal  Ideally is an AVERAGE < 135/85. This AVERAGE should be calculated from @ least 5-7 BP readings taken @ different times of day on different days of week. You should not respond to isolated BP readings , but rather the AVERAGE for that week . Hydrochlorothiazide can be stopped if blood pressure normalizes off decongestants

## 2011-08-14 NOTE — Assessment & Plan Note (Signed)
He is on ACE inhibitor and calcium channel blocker at optimal doses . Hydrochlorothiazide will be added until blood pressure is at goal off decongestants

## 2011-08-14 NOTE — Progress Notes (Signed)
  Subjective:    Patient ID: Tim Decker, male    DOB: June 17, 1955, 56 y.o.   MRN: [id number]  HPI Last week he developed sinus pressure without significant extrinsic symptoms or purulent secretions. He started taking Claritin-D. Last night he did not feel well; his blood pressure was found to be 188/114. This morning was 166/107. Last night he had some throbbing headache in his temples.    Review of Systems He denies classic frontal headache, facial pain, or nasal purulence. He's had minimal sneezing. He denies itchy, watery eyes.  He did have some discomfort in his maxillary teeth; those dental roots are in the sinuses.  He's had scant yellow sputum.     Objective:   Physical Exam General appearance:good health ;well nourished; no acute distress or increased work of breathing is present.  No  lymphadenopathy about the head, neck, or axilla noted.   Eyes: No conjunctival inflammation or lid edema is present.   Ears:  External ear exam shows no significant lesions or deformities.  Otoscopic examination reveals clear canals, tympanic membranes are intact bilaterally without bulging, retraction, inflammation or discharge.  Nose:  External nasal examination shows no deformity or inflammation. Nasal mucosa are pink and moist without lesions or exudates. No septal dislocation or deviation.No obstruction to airflow.   Oral exam: Dental hygiene is good; lips and gums are healthy appearing.There is no oropharyngeal erythema or exudate noted.      Heart:  Normal rate and regular rhythm. S1 and S2 normal without gallop, murmur, click, rub . S 4 Lungs:Chest clear to auscultation; no wheezes, rhonchi,rales ,or rubs present.No increased work of breathing.    Extremities:  No cyanosis, edema, or clubbing  noted    Skin: Warm & dry           Assessment & Plan:   #1 blood pressure spike, probably related to decongestant use  #2 sinus congestion without criteria for  rhinosinusitis  Plan: See orders and recommendations

## 2011-09-18 ENCOUNTER — Other Ambulatory Visit: Payer: Self-pay | Admitting: Internal Medicine

## 2011-09-18 NOTE — Telephone Encounter (Signed)
A1C 250.00 

## 2011-11-16 ENCOUNTER — Other Ambulatory Visit: Payer: Self-pay | Admitting: Internal Medicine

## 2011-11-16 NOTE — Telephone Encounter (Signed)
A1C 250.00 

## 2011-12-21 ENCOUNTER — Other Ambulatory Visit: Payer: Self-pay | Admitting: Internal Medicine

## 2012-01-25 ENCOUNTER — Other Ambulatory Visit: Payer: Self-pay | Admitting: Internal Medicine

## 2012-05-28 ENCOUNTER — Telehealth: Payer: Self-pay | Admitting: Internal Medicine

## 2012-05-28 DIAGNOSIS — E119 Type 2 diabetes mellitus without complications: Secondary | ICD-10-CM

## 2012-05-28 MED ORDER — METFORMIN HCL ER 500 MG PO TB24: ORAL_TABLET | ORAL | Status: DC

## 2012-05-28 NOTE — Telephone Encounter (Signed)
Future order placed 

## 2012-05-28 NOTE — Telephone Encounter (Signed)
refill  GLUCOPHAGE-XR 500 MG TAKE ONE TABLET BY MOUTH EVERY DAY WITH  LARGEST  MEAL #30 last fill 1.8.14

## 2012-06-01 ENCOUNTER — Other Ambulatory Visit: Payer: Self-pay

## 2012-08-17 ENCOUNTER — Other Ambulatory Visit: Payer: Self-pay | Admitting: Internal Medicine

## 2012-08-19 ENCOUNTER — Telehealth: Payer: Self-pay | Admitting: General Practice

## 2012-08-19 NOTE — Telephone Encounter (Signed)
Pt called and Left message on voicemail informing pt that he needs an office visit and lab work for more refills of his medication. Amlodipine, metformin, and Lotensin. Pt last seen on 07/2011 and last labs 06/19/11.

## 2012-08-20 MED ORDER — METFORMIN HCL ER 500 MG PO TB24: ORAL_TABLET | ORAL | Status: DC

## 2012-08-20 MED ORDER — BENAZEPRIL HCL 40 MG PO TABS: ORAL_TABLET | ORAL | Status: DC

## 2012-08-20 MED ORDER — AMLODIPINE BESYLATE 10 MG PO TABS: ORAL_TABLET | ORAL | Status: DC

## 2012-08-20 NOTE — Telephone Encounter (Signed)
Pt stated he could not make an appt until June. Scheduled for 6/5. Meds filled for 30 days to cover until appt date.

## 2012-09-16 ENCOUNTER — Other Ambulatory Visit: Payer: Self-pay | Admitting: Internal Medicine

## 2012-09-19 ENCOUNTER — Encounter: Payer: Self-pay | Admitting: Internal Medicine

## 2012-09-19 ENCOUNTER — Ambulatory Visit (INDEPENDENT_AMBULATORY_CARE_PROVIDER_SITE_OTHER): Payer: BC Managed Care – PPO | Admitting: Internal Medicine

## 2012-09-19 VITALS — BP 126/84 | HR 73 | Temp 97.8°F | Wt 206.0 lb

## 2012-09-19 DIAGNOSIS — R079 Chest pain, unspecified: Secondary | ICD-10-CM

## 2012-09-19 DIAGNOSIS — R0609 Other forms of dyspnea: Secondary | ICD-10-CM

## 2012-09-19 DIAGNOSIS — E8881 Metabolic syndrome: Secondary | ICD-10-CM

## 2012-09-19 DIAGNOSIS — R0989 Other specified symptoms and signs involving the circulatory and respiratory systems: Secondary | ICD-10-CM

## 2012-09-19 DIAGNOSIS — R5383 Other fatigue: Secondary | ICD-10-CM

## 2012-09-19 DIAGNOSIS — R0600 Dyspnea, unspecified: Secondary | ICD-10-CM

## 2012-09-19 DIAGNOSIS — E876 Hypokalemia: Secondary | ICD-10-CM | POA: Insufficient documentation

## 2012-09-19 DIAGNOSIS — R5381 Other malaise: Secondary | ICD-10-CM

## 2012-09-19 DIAGNOSIS — E782 Mixed hyperlipidemia: Secondary | ICD-10-CM

## 2012-09-19 DIAGNOSIS — I10 Essential (primary) hypertension: Secondary | ICD-10-CM

## 2012-09-19 LAB — HEPATIC FUNCTION PANEL
ALT: 21 U/L (ref 0–53)
AST: 17 U/L (ref 0–37)
Albumin: 4.1 g/dL (ref 3.5–5.2)
Alkaline Phosphatase: 35 U/L — ABNORMAL LOW (ref 39–117)
Bilirubin, Direct: 0 mg/dL (ref 0.0–0.3)
Total Bilirubin: 0.9 mg/dL (ref 0.3–1.2)
Total Protein: 7.1 g/dL (ref 6.0–8.3)

## 2012-09-19 LAB — LIPID PANEL
Cholesterol: 190 mg/dL (ref 0–200)
HDL: 28.3 mg/dL — ABNORMAL LOW (ref >39.00)
Total CHOL/HDL Ratio: 7
Triglycerides: 241 mg/dL — ABNORMAL HIGH (ref 0.0–149.0)
VLDL: 48.2 mg/dL — ABNORMAL HIGH (ref 0.0–40.0)

## 2012-09-19 LAB — CBC WITH DIFFERENTIAL/PLATELET
Basophils Absolute: 0 10*3/uL (ref 0.0–0.1)
Basophils Relative: 0.4 % (ref 0.0–3.0)
Eosinophils Absolute: 0.2 10*3/uL (ref 0.0–0.7)
Eosinophils Relative: 2.4 % (ref 0.0–5.0)
HCT: 41.2 % (ref 39.0–52.0)
Hemoglobin: 14.1 g/dL (ref 13.0–17.0)
Lymphocytes Relative: 22.9 % (ref 12.0–46.0)
Lymphs Abs: 1.6 10*3/uL (ref 0.7–4.0)
MCHC: 34.2 g/dL (ref 30.0–36.0)
MCV: 88.5 fl (ref 78.0–100.0)
Monocytes Absolute: 0.5 10*3/uL (ref 0.1–1.0)
Monocytes Relative: 7.6 % (ref 3.0–12.0)
Neutro Abs: 4.6 10*3/uL (ref 1.4–7.7)
Neutrophils Relative %: 66.7 % (ref 43.0–77.0)
Platelets: 273 10*3/uL (ref 150.0–400.0)
RBC: 4.66 Mil/uL (ref 4.22–5.81)
RDW: 14.1 % (ref 11.5–14.6)
WBC: 6.9 10*3/uL (ref 4.5–10.5)

## 2012-09-19 LAB — BASIC METABOLIC PANEL
BUN: 14 mg/dL (ref 6–23)
CO2: 34 mEq/L — ABNORMAL HIGH (ref 19–32)
Calcium: 9.1 mg/dL (ref 8.4–10.5)
Chloride: 102 mEq/L (ref 96–112)
Creatinine, Ser: 1 mg/dL (ref 0.4–1.5)
GFR: 80.78 mL/min (ref >60.00)
Glucose, Bld: 75 mg/dL (ref 70–99)
Potassium: 3 mEq/L — ABNORMAL LOW (ref 3.5–5.1)
Sodium: 140 mEq/L (ref 135–145)

## 2012-09-19 LAB — HEMOGLOBIN A1C: Hgb A1c MFr Bld: 5.6 % (ref 4.6–6.5)

## 2012-09-19 LAB — MICROALBUMIN / CREATININE URINE RATIO
Creatinine,U: 272.2 mg/dL
Microalb Creat Ratio: 16.1 mg/g (ref 0.0–30.0)
Microalb, Ur: 43.8 mg/dL — ABNORMAL HIGH (ref 0.0–1.9)

## 2012-09-19 LAB — TSH: TSH: 0.87 u[IU]/mL (ref 0.35–5.50)

## 2012-09-19 LAB — LDL CHOLESTEROL, DIRECT: Direct LDL: 106.2 mg/dL

## 2012-09-19 NOTE — Patient Instructions (Addendum)

## 2012-09-19 NOTE — Progress Notes (Signed)
Subjective:    Patient ID: Tim Decker, male    DOB: [birth year], 57 y.o.   MRN: [id number]  HPI  The patient is here for followup of metabolic syndrome, hyperlipidemia, and hypertension.  The most recent A1c 5/12 was 5.6%  , which correlates to an average sugar of 114 , and long-term risk of 12% . Random glucoses checked by his mother were ? ("fine") Fasting blood sugar  not monitored .  Two-hour postprandial glucose is  not monitored . Last ophthalmologic examination ? 2004 or earlier   Diet is low salt & carb/ sugar . No exercise  As working 2 jobs.  The most recent lipids 3/13 revealed LDL 106.2 ; HDL 33.4 ; and triglycerides 209  . There is inadvertent non compliance with the Trilipix & Crestor.? "messed up by [facility]" Blood pressure range 126/85- 145/100  . There is medical  compliance with antihypertensive medications  No  medication adverse effects suggested .  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        Review of Systems Constitutional: No  significant weight change;  some fatigue Eyes: No  blurred vision;  double vision ; loss of vision Cardiovascular:   No syncope; claudication . Some  edema  After prolonged standing Respiratory: Some exertional dyspnea;  paroxysmal nocturnal dyspnea Genitourinary: No polyuria   Endo: No polyphagia, polydipsia Musculoskeletal: No myalgias or muscle cramping  Dermatologic: No non healing  Lesions; change in color or temperature of skin Neurologic: No  limb weakness;  numbness or tingling;  burning Endocrine: No change in hair, skin, nails. No excessive thirst; excessive hunger;  excessive urination     Objective:   Physical Exam Gen.:  well-nourished in appearance. Alert, appropriate and cooperative throughout exam.Head: Normocephalic without obvious abnormalities;  pattern alopecia .Beard & moustache Eyes: No  corneal or conjunctival inflammation noted. No lid lag or proptosis. Extraocular motion intact.  Nose: External nasal exam reveals no deformity or inflammation. Nasal mucosa are pink and moist. No lesions or exudates noted.  Mouth: Oral mucosa and oropharynx reveal no lesions or exudates. Teeth in good repair. Neck: No deformities, masses, or tenderness noted.  Thyroid normal. Lungs: Normal respiratory effort; chest expands symmetrically. Lungs are clear to auscultation without rales, wheezes, or increased work of breathing. Heart: Normal rate and rhythm. Normal S1 and S2. No gallop, click, or rub. S4 w/o murmur. Abdomen: Bowel sounds normal; abdomen soft and nontender. No masses, organomegaly or hernias noted.                            Musculoskeletal/extremities: No deformity or scoliosis noted of  the thoracic or lumbar spine.  No clubbing, cyanosis, edema, or significant extremity  deformity noted. Range of motion normal .Tone & strength  Normal. Joints normal / reveal mild  DJD DIP changes. Nail health good. Able to lie down & sit up w/o help. Negative SLR bilaterally Vascular: Carotid, radial artery, dorsalis pedis and  posterior tibial pulses are full and equal. No bruits present. Neurologic: Alert and oriented x3. Deep tendon reflexes symmetrical and normal.  Light touch normal over feet.        Skin: Intact without suspicious lesions or rashes. Lymph: No cervical, axillary lymphadenopathy present. Psych: Mood and affect are normal. Normally interactive  Assessment & Plan:  #1 See Current Assessment & Plan in Problem List under specific Diagnosis #2 chest pain; EKG stable;no ischemic changes on EKG

## 2012-10-15 ENCOUNTER — Other Ambulatory Visit: Payer: Self-pay | Admitting: Internal Medicine

## 2012-10-17 ENCOUNTER — Telehealth: Payer: Self-pay | Admitting: *Deleted

## 2012-10-17 MED ORDER — HYDROCHLOROTHIAZIDE 12.5 MG PO CAPS: ORAL_CAPSULE | ORAL | Status: DC

## 2012-10-17 MED ORDER — POTASSIUM CHLORIDE ER 10 MEQ PO TBCR: EXTENDED_RELEASE_TABLET | ORAL | Status: DC

## 2012-10-17 MED ORDER — DOXAZOSIN MESYLATE 2 MG PO TABS: ORAL_TABLET | ORAL | Status: DC

## 2012-10-17 NOTE — Telephone Encounter (Signed)
Refill done.  

## 2013-02-10 ENCOUNTER — Other Ambulatory Visit: Payer: Self-pay | Admitting: Internal Medicine

## 2013-02-10 NOTE — Telephone Encounter (Signed)
Benazepril refill sent to pharmacy 

## 2013-02-20 ENCOUNTER — Other Ambulatory Visit: Payer: Self-pay

## 2013-05-01 ENCOUNTER — Other Ambulatory Visit: Payer: Self-pay | Admitting: Internal Medicine

## 2013-05-02 NOTE — Telephone Encounter (Signed)
Amlodipine refilled per protocol. JG//CMA 

## 2013-06-05 ENCOUNTER — Other Ambulatory Visit: Payer: Self-pay | Admitting: *Deleted

## 2013-06-05 MED ORDER — METFORMIN HCL ER 500 MG PO TB24: ORAL_TABLET | ORAL | Status: DC

## 2013-06-05 NOTE — Telephone Encounter (Signed)
Rx sent to the pharmacy by e-script.  Pt needs office visit for Diabetes follow-up.//AB/CMA

## 2013-07-21 ENCOUNTER — Other Ambulatory Visit: Payer: Self-pay

## 2013-07-21 MED ORDER — POTASSIUM CHLORIDE ER 10 MEQ PO TBCR: EXTENDED_RELEASE_TABLET | ORAL | Status: DC

## 2013-07-21 MED ORDER — HYDROCHLOROTHIAZIDE 12.5 MG PO CAPS: ORAL_CAPSULE | ORAL | Status: DC

## 2013-07-21 MED ORDER — DOXAZOSIN MESYLATE 2 MG PO TABS: ORAL_TABLET | ORAL | Status: DC

## 2013-09-28 ENCOUNTER — Other Ambulatory Visit: Payer: Self-pay | Admitting: Internal Medicine

## 2013-12-08 ENCOUNTER — Encounter: Payer: Self-pay | Admitting: Internal Medicine

## 2013-12-23 ENCOUNTER — Other Ambulatory Visit: Payer: Self-pay

## 2013-12-23 MED ORDER — BENAZEPRIL HCL 40 MG PO TABS: ORAL_TABLET | ORAL | Status: DC

## 2013-12-25 ENCOUNTER — Ambulatory Visit: Payer: BC Managed Care – PPO | Admitting: Internal Medicine

## 2013-12-30 ENCOUNTER — Ambulatory Visit (INDEPENDENT_AMBULATORY_CARE_PROVIDER_SITE_OTHER): Payer: No Typology Code available for payment source | Admitting: Internal Medicine

## 2013-12-30 ENCOUNTER — Encounter: Payer: Self-pay | Admitting: Internal Medicine

## 2013-12-30 VITALS — BP 124/82 | HR 78 | Temp 98.4°F | Resp 13 | Ht 68.0 in | Wt 212.1 lb

## 2013-12-30 DIAGNOSIS — R7303 Prediabetes: Secondary | ICD-10-CM | POA: Insufficient documentation

## 2013-12-30 DIAGNOSIS — Z86010 Personal history of colonic polyps: Secondary | ICD-10-CM

## 2013-12-30 DIAGNOSIS — Z0000 Encounter for general adult medical examination without abnormal findings: Secondary | ICD-10-CM

## 2013-12-30 DIAGNOSIS — I10 Essential (primary) hypertension: Secondary | ICD-10-CM

## 2013-12-30 DIAGNOSIS — E782 Mixed hyperlipidemia: Secondary | ICD-10-CM

## 2013-12-30 DIAGNOSIS — R7309 Other abnormal glucose: Secondary | ICD-10-CM

## 2013-12-30 NOTE — Progress Notes (Signed)
Subjective:    Patient ID: Tim Decker, male    DOB: September 21, 1955, 58 y.o.   MRN: [id number]  HPI  He is here for a physical;acute issues include some DOE in context of weight gain of 10-15#. No regular CVE .   Blood pressure range  : 130/80-140/90. Compliant with anti hypertemsive medication. No lightheadedness or other adverse medication effect described.  A heart healthy diet not followed;on low salt diet .       Review of Systems  Pedal edema after standing 10-11 hours.  All below NEGATIVE: Chest pain, palpitations       Claudication  Lightheadedness,Syncope Polyuria/phagia/dipsia    Blurred vision /diplopia/lossof vision Limb numbness/tingling/burning Non healing skin lesions Abd pain, bowel changes   Myalgias      Objective:   Physical Exam    Positive pertinent findings include: Pattern alopecia is present. He has a IT consultant. The right clavicle has boss from a prior fracture @ mid line  There is a lateral crease of the external nose. Abdomen is protuberant without masses or organomegaly. There is some dullness to percussion in RU quadrant. There are intrascrotal granuloma bilaterally, left greater than the right. Varices are noted on the left also. Prostate is 1.5+ enlarged symmetrically without nodularity or induration.   Gen.: Healthy and well-nourished in appearance. Alert, appropriate and cooperative throughout exam. Head: Normocephalic without obvious abnormalities Eyes: No corneal or conjunctival inflammation noted. Pupils equal round reactive to light and accommodation. Extraocular motion intact. Ears: External  ear exam reveals no significant lesions or deformities. Canals clear .TMs normal. Hearing is grossly normal bilaterally. Nose: External nasal exam reveals no inflammation. Nasal mucosa are pink and moist. No lesions or exudates noted.   Mouth: Oral mucosa and oropharynx reveal no lesions or exudates. Teeth in good repair. Neck: No  deformities, masses, or tenderness noted. Range of motion & Thyroid normal Lungs: Normal respiratory effort; chest expands symmetrically. Lungs are clear to auscultation without rales, wheezes, or increased work of breathing. Heart: Normal rate and rhythm. Normal S1 and S2. No gallop, click, or rub. No murmur. Abdomen: Bowel sounds normal; abdomen soft and nontender. No masses, organomegaly or hernias noted.                                 Musculoskeletal/extremities: No deformity or scoliosis noted of  the thoracic or lumbar spine.  No clubbing, cyanosis, edema, or significant extremity  deformity noted. Range of motion normal .Tone & strength normal. Hand joints normal Fingernail health good. Able to lie down & sit up w/o help. Negative SLR bilaterally Vascular: Carotid, radial artery, dorsalis pedis and  posterior tibial pulses are full and equal. No bruits present. Neurologic: Alert and oriented x3. Deep tendon reflexes symmetrical and normal.  Gait normal.      Skin: Intact without suspicious lesions or rashes. Lymph: No cervical, axillary, or inguinal lymphadenopathy present. Psych: Mood and affect are normal. Normally interactive  Assessment & Plan:  #1 comprehensive physical exam; no acute findings  Plan: see Orders  & Recommendations

## 2013-12-30 NOTE — Progress Notes (Signed)
Pre visit review using our clinic review tool, if applicable. No additional management support is needed unless otherwise documented below in the visit note. 

## 2013-12-30 NOTE — Patient Instructions (Signed)
Your next office appointment will be determined based upon review of your pending labs . Those instructions will be transmitted to you through My Chart. Cardiovascular exercise, this can be as simple a program as walking, is recommended 30-45 minutes 3-4 times per week. If you're not exercising you should take 6-8 weeks to build up to this level.  

## 2014-01-15 ENCOUNTER — Other Ambulatory Visit: Payer: Self-pay | Admitting: Internal Medicine

## 2014-01-15 ENCOUNTER — Other Ambulatory Visit (INDEPENDENT_AMBULATORY_CARE_PROVIDER_SITE_OTHER): Payer: No Typology Code available for payment source

## 2014-01-15 DIAGNOSIS — Z0000 Encounter for general adult medical examination without abnormal findings: Secondary | ICD-10-CM

## 2014-01-15 LAB — BASIC METABOLIC PANEL
BUN: 15 mg/dL (ref 6–23)
CO2: 31 mEq/L (ref 19–32)
Calcium: 9.3 mg/dL (ref 8.4–10.5)
Chloride: 105 mEq/L (ref 96–112)
Creatinine, Ser: 1 mg/dL (ref 0.4–1.5)
GFR: 81.34 mL/min (ref >60.00)
Glucose, Bld: 94 mg/dL (ref 70–99)
Potassium: 3.6 mEq/L (ref 3.5–5.1)
Sodium: 142 mEq/L (ref 135–145)

## 2014-01-15 LAB — CBC WITH DIFFERENTIAL/PLATELET
Basophils Absolute: 0 10*3/uL (ref 0.0–0.1)
Basophils Relative: 0.2 % (ref 0.0–3.0)
Eosinophils Absolute: 0.3 10*3/uL (ref 0.0–0.7)
Eosinophils Relative: 4.5 % (ref 0.0–5.0)
HCT: 44.7 % (ref 39.0–52.0)
Hemoglobin: 15.1 g/dL (ref 13.0–17.0)
Lymphocytes Relative: 22.7 % (ref 12.0–46.0)
Lymphs Abs: 1.5 10*3/uL (ref 0.7–4.0)
MCHC: 33.8 g/dL (ref 30.0–36.0)
MCV: 89.9 fl (ref 78.0–100.0)
Monocytes Absolute: 0.5 10*3/uL (ref 0.1–1.0)
Monocytes Relative: 7.7 % (ref 3.0–12.0)
Neutro Abs: 4.3 10*3/uL (ref 1.4–7.7)
Neutrophils Relative %: 64.9 % (ref 43.0–77.0)
Platelets: 294 10*3/uL (ref 150.0–400.0)
RBC: 4.97 Mil/uL (ref 4.22–5.81)
RDW: 13.7 % (ref 11.5–15.5)
WBC: 6.6 10*3/uL (ref 4.0–10.5)

## 2014-01-15 LAB — TSH: TSH: 1.61 u[IU]/mL (ref 0.35–4.50)

## 2014-01-15 LAB — HEPATIC FUNCTION PANEL
ALT: 27 U/L (ref 0–53)
AST: 19 U/L (ref 0–37)
Albumin: 4.1 g/dL (ref 3.5–5.2)
Alkaline Phosphatase: 42 U/L (ref 39–117)
Bilirubin, Direct: 0.1 mg/dL (ref 0.0–0.3)
Total Bilirubin: 0.4 mg/dL (ref 0.2–1.2)
Total Protein: 7.2 g/dL (ref 6.0–8.3)

## 2014-01-15 LAB — HEMOGLOBIN A1C: Hgb A1c MFr Bld: 5.6 % (ref 4.6–6.5)

## 2014-01-15 LAB — PSA: PSA: 1.03 ng/mL (ref 0.10–4.00)

## 2014-01-17 LAB — NMR LIPOPROFILE WITH LIPIDS
Cholesterol, Total: 185 mg/dL (ref 100–199)
HDL Particle Number: 27.6 umol/L — ABNORMAL LOW (ref >=30.5)
HDL Size: 9.1 nm — ABNORMAL LOW (ref >=9.2)
HDL-C: 27 mg/dL — ABNORMAL LOW (ref >39)
LDL (calc): 93 mg/dL (ref 0–99)
LDL Particle Number: 1433 nmol/L — ABNORMAL HIGH (ref <1000)
LDL Size: 19.8 nm (ref >=20.8)
LP-IR Score: 76 — ABNORMAL HIGH (ref <=45)
Large HDL-P: 2 umol/L — ABNORMAL LOW (ref >=4.8)
Large VLDL-P: 16.6 nmol/L — ABNORMAL HIGH (ref <=2.7)
Small LDL Particle Number: 963 nmol/L — ABNORMAL HIGH (ref <=527)
Triglycerides: 327 mg/dL — ABNORMAL HIGH (ref 0–149)
VLDL Size: 55.9 nm — ABNORMAL HIGH (ref <=46.6)

## 2014-01-18 ENCOUNTER — Other Ambulatory Visit: Payer: Self-pay | Admitting: Internal Medicine

## 2014-01-18 DIAGNOSIS — R739 Hyperglycemia, unspecified: Secondary | ICD-10-CM

## 2014-01-21 ENCOUNTER — Other Ambulatory Visit: Payer: Self-pay | Admitting: Internal Medicine

## 2014-01-22 ENCOUNTER — Other Ambulatory Visit: Payer: Self-pay

## 2014-01-22 MED ORDER — AMLODIPINE BESYLATE 10 MG PO TABS: ORAL_TABLET | ORAL | Status: DC

## 2014-01-22 NOTE — Telephone Encounter (Signed)
01/15/14 A1c was 5.6% Stop this & recheck  A1c in 3-4 mos

## 2014-01-22 NOTE — Telephone Encounter (Signed)
I do not see this on patients med list -

## 2014-01-30 ENCOUNTER — Other Ambulatory Visit: Payer: Self-pay

## 2014-02-01 ENCOUNTER — Other Ambulatory Visit: Payer: Self-pay | Admitting: Internal Medicine

## 2014-02-17 ENCOUNTER — Other Ambulatory Visit: Payer: Self-pay

## 2014-02-17 MED ORDER — BENAZEPRIL HCL 40 MG PO TABS: ORAL_TABLET | ORAL | Status: DC

## 2014-03-31 ENCOUNTER — Other Ambulatory Visit: Payer: Self-pay | Admitting: Internal Medicine

## 2014-09-28 ENCOUNTER — Other Ambulatory Visit: Payer: Self-pay | Admitting: Internal Medicine

## 2014-11-01 ENCOUNTER — Other Ambulatory Visit: Payer: Self-pay | Admitting: Internal Medicine

## 2014-11-11 ENCOUNTER — Other Ambulatory Visit: Payer: Self-pay | Admitting: Emergency Medicine

## 2014-11-11 MED ORDER — DOXAZOSIN MESYLATE 2 MG PO TABS: ORAL_TABLET | ORAL | Status: DC

## 2014-11-11 MED ORDER — HYDROCHLOROTHIAZIDE 12.5 MG PO CAPS: 12.5000 mg | ORAL_CAPSULE | Freq: Every day | ORAL | Status: DC

## 2014-11-11 MED ORDER — POTASSIUM CHLORIDE ER 10 MEQ PO TBCR: 10.0000 meq | EXTENDED_RELEASE_TABLET | Freq: Every day | ORAL | Status: DC

## 2015-01-17 ENCOUNTER — Other Ambulatory Visit: Payer: Self-pay | Admitting: Internal Medicine

## 2015-01-18 ENCOUNTER — Other Ambulatory Visit: Payer: Self-pay | Admitting: Emergency Medicine

## 2015-01-18 MED ORDER — HYDROCHLOROTHIAZIDE 12.5 MG PO CAPS: 12.5000 mg | ORAL_CAPSULE | Freq: Every day | ORAL | Status: DC

## 2015-01-18 MED ORDER — POTASSIUM CHLORIDE ER 10 MEQ PO TBCR: 10.0000 meq | EXTENDED_RELEASE_TABLET | Freq: Every day | ORAL | Status: DC

## 2015-01-18 MED ORDER — DOXAZOSIN MESYLATE 2 MG PO TABS: ORAL_TABLET | ORAL | Status: DC

## 2015-02-21 ENCOUNTER — Other Ambulatory Visit: Payer: Self-pay | Admitting: Internal Medicine

## 2015-02-22 ENCOUNTER — Other Ambulatory Visit: Payer: Self-pay | Admitting: Emergency Medicine

## 2015-02-22 MED ORDER — DOXAZOSIN MESYLATE 2 MG PO TABS: ORAL_TABLET | ORAL | Status: DC

## 2015-02-22 MED ORDER — HYDROCHLOROTHIAZIDE 12.5 MG PO CAPS: 12.5000 mg | ORAL_CAPSULE | Freq: Every day | ORAL | Status: DC

## 2015-02-22 MED ORDER — POTASSIUM CHLORIDE ER 10 MEQ PO TBCR: 10.0000 meq | EXTENDED_RELEASE_TABLET | Freq: Every day | ORAL | Status: DC

## 2015-02-24 ENCOUNTER — Ambulatory Visit (INDEPENDENT_AMBULATORY_CARE_PROVIDER_SITE_OTHER): Payer: BLUE CROSS/BLUE SHIELD | Admitting: Internal Medicine

## 2015-02-24 ENCOUNTER — Encounter: Payer: Self-pay | Admitting: Internal Medicine

## 2015-02-24 VITALS — BP 150/92 | HR 89 | Temp 98.3°F | Resp 16 | Ht 69.0 in | Wt 219.0 lb

## 2015-02-24 DIAGNOSIS — E782 Mixed hyperlipidemia: Secondary | ICD-10-CM | POA: Diagnosis not present

## 2015-02-24 DIAGNOSIS — Z86010 Personal history of colonic polyps: Secondary | ICD-10-CM

## 2015-02-24 DIAGNOSIS — R5383 Other fatigue: Secondary | ICD-10-CM | POA: Diagnosis not present

## 2015-02-24 DIAGNOSIS — R739 Hyperglycemia, unspecified: Secondary | ICD-10-CM

## 2015-02-24 DIAGNOSIS — I10 Essential (primary) hypertension: Secondary | ICD-10-CM

## 2015-02-24 DIAGNOSIS — R0789 Other chest pain: Secondary | ICD-10-CM

## 2015-02-24 MED ORDER — CHLORTHALIDONE 25 MG PO TABS: 25.0000 mg | ORAL_TABLET | Freq: Every day | ORAL | Status: DC

## 2015-02-24 NOTE — Progress Notes (Signed)
Pre visit review using our clinic review tool, if applicable. No additional management support is needed unless otherwise documented below in the visit note. 

## 2015-02-24 NOTE — Progress Notes (Signed)
   Subjective:    Patient ID: Tim Decker, male    DOB: 1955/09/23, 59 y.o.   MRN: [id number]  HPI The patient is here to assess status of active health conditions.  PMH, FH, & Social History reviewed & updated.No change in FH as recorded.  He is no longer on Doxazocin, potassium supplement, or HCTZ. The reason is unclear.  Blood pressure was possibly 170/110 at the dental office last week. They could not complete the procedure. Prior to that time he was not checking his blood pressure. Since that time he's noted blood pressures 170-196 over 100-120.  He eats fried foods and red meat. He does restrict salt. He is not exercising. He has restarted smoking 5 cigarettes a day.  Review of systems is positive for some dyspnea at rest. He also has fatigue. He describes blurred but not double or loss of vision. Over the last week plus he's had some chest soreness which is nonexertional. He describes some cold intolerance. He has nocturia 2-3 X/night.  Last labs on record were 01/15/14. Triglycerides were 327 and LDL 93. A1c was 5.6%. TSH was therapeutic at 1.61. PSA was 1.03.    Review of Systems  Palpitations, tachycardia,  paroxysmal nocturnal dyspnea, claudication or edema are absent. No unexplained weight loss, abdominal pain, significant dyspepsia, dysphagia, melena, rectal bleeding, or persistently small caliber stools. Dysuria, pyuria, hematuria, frequency, or polyuria are denied. Change in hair, skin, nails denied. No bowel changes of constipation or diarrhea. No intolerance to heat .     Objective:   Physical Exam  Pertinent or positive findings include:Pattern alopecia is present. He has a IT consultant. Arteriolar narrowing is present on fundal exam. He has mild crepitus in the knees.  General appearance :adequately nourished; in no distress.  Eyes: No conjunctival inflammation or scleral icterus is present.  Oral exam:  Lips and gums are healthy appearing.There is no  oropharyngeal erythema or exudate noted. Dental hygiene is good.  Heart:  Normal rate and regular rhythm. S1 and S2 normal without gallop, murmur, click, rub or other extra sounds    Lungs:Chest clear to auscultation; no wheezes, rhonchi,rales ,or rubs present.No increased work of breathing.   Abdomen: bowel sounds normal, soft and non-tender without masses, organomegaly or hernias noted.  No guarding or rebound. No flank tenderness to percussion.  Vascular : all pulses equal ; no bruits present.  Skin:Warm & dry.  Intact without suspicious lesions or rashes ; no tenting or jaundice   Lymphatic: No lymphadenopathy is noted about the head, neck, axilla.   Neuro: Strength, tone & DTRs normal.    Assessment & Plan:  See Current Assessment & Plan in Problem List under specific Diagnosis

## 2015-02-24 NOTE — Assessment & Plan Note (Signed)
CBC

## 2015-02-24 NOTE — Patient Instructions (Signed)

## 2015-02-24 NOTE — Assessment & Plan Note (Signed)
A1c

## 2015-02-24 NOTE — Assessment & Plan Note (Signed)
Blood pressure goals reviewed. BMET 

## 2015-02-24 NOTE — Assessment & Plan Note (Signed)
Lipids, LFTs, TSH  

## 2015-02-26 ENCOUNTER — Other Ambulatory Visit (INDEPENDENT_AMBULATORY_CARE_PROVIDER_SITE_OTHER): Payer: BLUE CROSS/BLUE SHIELD

## 2015-02-26 DIAGNOSIS — E782 Mixed hyperlipidemia: Secondary | ICD-10-CM

## 2015-02-26 DIAGNOSIS — I10 Essential (primary) hypertension: Secondary | ICD-10-CM

## 2015-02-26 DIAGNOSIS — R739 Hyperglycemia, unspecified: Secondary | ICD-10-CM

## 2015-02-26 DIAGNOSIS — R5383 Other fatigue: Secondary | ICD-10-CM | POA: Diagnosis not present

## 2015-02-26 LAB — CBC WITH DIFFERENTIAL/PLATELET
Basophils Absolute: 0 10*3/uL (ref 0.0–0.1)
Basophils Relative: 0.4 % (ref 0.0–3.0)
Eosinophils Absolute: 0.2 10*3/uL (ref 0.0–0.7)
Eosinophils Relative: 3.1 % (ref 0.0–5.0)
HCT: 45.9 % (ref 39.0–52.0)
Hemoglobin: 15.5 g/dL (ref 13.0–17.0)
Lymphocytes Relative: 18.1 % (ref 12.0–46.0)
Lymphs Abs: 1.2 10*3/uL (ref 0.7–4.0)
MCHC: 33.9 g/dL (ref 30.0–36.0)
MCV: 91.3 fl (ref 78.0–100.0)
Monocytes Absolute: 0.7 10*3/uL (ref 0.1–1.0)
Monocytes Relative: 10.6 % (ref 3.0–12.0)
Neutro Abs: 4.7 10*3/uL (ref 1.4–7.7)
Neutrophils Relative %: 67.8 % (ref 43.0–77.0)
Platelets: 305 10*3/uL (ref 150.0–400.0)
RBC: 5.03 Mil/uL (ref 4.22–5.81)
RDW: 13.9 % (ref 11.5–15.5)
WBC: 6.9 10*3/uL (ref 4.0–10.5)

## 2015-02-26 LAB — BASIC METABOLIC PANEL
BUN: 17 mg/dL (ref 6–23)
CO2: 32 mEq/L (ref 19–32)
Calcium: 9.6 mg/dL (ref 8.4–10.5)
Chloride: 101 mEq/L (ref 96–112)
Creatinine, Ser: 1.2 mg/dL (ref 0.40–1.50)
GFR: 65.65 mL/min (ref >60.00)
Glucose, Bld: 108 mg/dL — ABNORMAL HIGH (ref 70–99)
Potassium: 3.2 mEq/L — ABNORMAL LOW (ref 3.5–5.1)
Sodium: 141 mEq/L (ref 135–145)

## 2015-02-26 LAB — HEPATIC FUNCTION PANEL
ALT: 32 U/L (ref 0–53)
AST: 20 U/L (ref 0–37)
Albumin: 4.4 g/dL (ref 3.5–5.2)
Alkaline Phosphatase: 52 U/L (ref 39–117)
Bilirubin, Direct: 0.1 mg/dL (ref 0.0–0.3)
Total Bilirubin: 0.6 mg/dL (ref 0.2–1.2)
Total Protein: 7.3 g/dL (ref 6.0–8.3)

## 2015-02-26 LAB — HEMOGLOBIN A1C: Hgb A1c MFr Bld: 5.8 % (ref 4.6–6.5)

## 2015-02-26 LAB — LIPID PANEL
Cholesterol: 172 mg/dL (ref 0–200)
HDL: 27.9 mg/dL — ABNORMAL LOW (ref >39.00)
NonHDL: 144.35
Total CHOL/HDL Ratio: 6
Triglycerides: 342 mg/dL — ABNORMAL HIGH (ref 0.0–149.0)
VLDL: 68.4 mg/dL — ABNORMAL HIGH (ref 0.0–40.0)

## 2015-02-26 LAB — LDL CHOLESTEROL, DIRECT: Direct LDL: 82 mg/dL

## 2015-02-26 LAB — TSH: TSH: 2.52 u[IU]/mL (ref 0.35–4.50)

## 2015-03-10 ENCOUNTER — Ambulatory Visit (INDEPENDENT_AMBULATORY_CARE_PROVIDER_SITE_OTHER): Payer: BLUE CROSS/BLUE SHIELD | Admitting: Internal Medicine

## 2015-03-10 ENCOUNTER — Encounter: Payer: Self-pay | Admitting: Internal Medicine

## 2015-03-10 VITALS — BP 140/50 | HR 79 | Temp 98.3°F | Ht 69.0 in | Wt 220.5 lb

## 2015-03-10 DIAGNOSIS — I10 Essential (primary) hypertension: Secondary | ICD-10-CM

## 2015-03-10 DIAGNOSIS — E876 Hypokalemia: Secondary | ICD-10-CM | POA: Diagnosis not present

## 2015-03-10 DIAGNOSIS — E782 Mixed hyperlipidemia: Secondary | ICD-10-CM

## 2015-03-10 NOTE — Progress Notes (Signed)
Pre visit review using our clinic review tool, if applicable. No additional management support is needed unless otherwise documented below in the visit note. 

## 2015-03-10 NOTE — Progress Notes (Signed)
   Subjective:    Patient ID: Tim Decker, male    DOB: Feb 12, 1956, 59 y.o.   MRN: [id number]  HPI He has an older blood pressure cuff which has been reading 147-188/82-115. Today's blood pressure was 140/80 here; his cuff read 155/100. He's been unable to exercise on a regular basis because of work commitments. He is not on a heart healthy diet but does restrict salt.  He has occasional fluttering. His only stimulant intake  iis one cup of coffee per day.  Review of recent labs completed with recommendations to address risk.  Review of Systems  Chest pain,  tachycardia, exertional dyspnea, paroxysmal nocturnal dyspnea, claudication or edema are absent.     Objective:   Physical Exam  Pertinent or positive findings include: Repeat blood pressure by me was 130/92. Pattern alopecia is present. He has a IT consultant. Crease or scar across bridge of nose.No dysrhythmias were noted.  General appearance :adequately nourished; in no distress.  Eyes: No conjunctival inflammation or scleral icterus is present.  Oral exam:  Lips and gums are healthy appearing.There is no oropharyngeal erythema or exudate noted. Dental hygiene is good.  Heart:  Normal rate and regular rhythm. S1 and S2 normal without gallop, murmur, click, rub or other extra sounds    Lungs:Chest clear to auscultation; no wheezes, rhonchi,rales ,or rubs present.No increased work of breathing.   Abdomen: Protuberant;bowel sounds normal, soft and non-tender without masses, organomegaly or hernias noted.  No guarding or rebound.   Vascular : all pulses equal ; no bruits present.  Skin:Warm & dry.  Intact without suspicious lesions or rashes ; no tenting or jaundice   Lymphatic: No lymphadenopathy is noted about the head, neck, axilla.   Neuro: Strength, tone & DTRs normal.     Assessment & Plan:  #1 essential hypertension  #2 hypertriglyceridemia; labs reviewed and nutritional interventions discussed. Diagram  provided. See AVS

## 2015-03-10 NOTE — Patient Instructions (Addendum)
As we discussed the most common cause of elevated triglycerides (TG) is the ingestion of sugar from high fructose corn syrup sources added to processed foods & drinks or sugar in alcohol.   Consume less than 40 Grams (preferably ZERO) of sugar per day from foods & drinks with High Fructose Corn Syrup (HFCS) sugar as #1,2,3 or # 4 on label.Whole [employer], [employer] & [employer] do not carry products with HFCS.  Alcohol intake is recommended as less than 1-2 drinks per day for men &  less than 1 drink per day for women.    Minimal Blood Pressure Goal= AVERAGE < 140/90;  Ideal is an AVERAGE < 135/85. This AVERAGE should be calculated from @ least 5-7 BP readings taken @ different times of day on different days of week. You should not respond to isolated BP readings , but rather the AVERAGE for that week .Please bring your  blood pressure cuff to office visits to verify that it is reliable.It  can also be checked against the blood pressure device at the pharmacy. Finger or wrist cuffs are not dependable; an arm cuff is.  To increase  Potassium (K+) increase citrus fruits & bananas in diet and use the salt substitute No Salt, which contains  potassium , to season food @ the table.  To prevent palpitations or premature beats, avoid stimulants such as decongestants, diet pills, nicotine, or caffeine (coffee, tea, cola, or chocolate) to excess.

## 2015-03-21 ENCOUNTER — Other Ambulatory Visit: Payer: Self-pay | Admitting: Internal Medicine

## 2015-03-22 ENCOUNTER — Other Ambulatory Visit: Payer: Self-pay | Admitting: Emergency Medicine

## 2015-03-22 MED ORDER — POTASSIUM CHLORIDE ER 10 MEQ PO TBCR
10.0000 meq | EXTENDED_RELEASE_TABLET | Freq: Once | ORAL | Status: DC
Start: 2015-03-22 — End: 2015-10-02

## 2015-03-22 MED ORDER — HYDROCHLOROTHIAZIDE 12.5 MG PO CAPS: 12.5000 mg | ORAL_CAPSULE | Freq: Every day | ORAL | Status: DC

## 2015-03-22 MED ORDER — DOXAZOSIN MESYLATE 2 MG PO TABS: 2.0000 mg | ORAL_TABLET | Freq: Every day | ORAL | Status: DC

## 2015-03-25 ENCOUNTER — Ambulatory Visit (INDEPENDENT_AMBULATORY_CARE_PROVIDER_SITE_OTHER): Payer: BLUE CROSS/BLUE SHIELD | Admitting: Internal Medicine

## 2015-03-25 ENCOUNTER — Encounter: Payer: Self-pay | Admitting: Internal Medicine

## 2015-03-25 VITALS — BP 140/80 | HR 96 | Temp 98.2°F | Resp 16 | Wt 223.0 lb

## 2015-03-25 DIAGNOSIS — S0990XA Unspecified injury of head, initial encounter: Secondary | ICD-10-CM

## 2015-03-25 NOTE — Progress Notes (Signed)
Patient received education resource, including the self-management goal and tool. Patient verbalized understanding. 

## 2015-03-25 NOTE — Patient Instructions (Signed)
Use an anti-inflammatory cream such as Aspercreme or Zostrix cream twice a day to the affected area as needed. In lieu of this warm moist compresses or  hot water bottle can be used. Do not apply ice .Tylenol is safest bedtime medication as long as the labeling directions are followed

## 2015-03-25 NOTE — Progress Notes (Signed)
   Subjective:    Patient ID: Tim Decker, male    DOB: 10-Aug-1955, 59 y.o.   MRN: [id number]  HPI On 12/5 at work he slipped on wet pavement getting out of a car and fell backwards. He hit his posterior neck and occiput on the running board of the car. He stated that he "saw stars" but did not lose consciousness. Initially also had some low back discomfort.  Since that time he's had minor bitemporal headache and some soreness in the neck and occipital area.  He's had no associated neuromuscular symptoms otherwise.  He has been taking ibuprofen as needed. He has taken this at bedtime on an empty stomach.  Review of Systems  No significant headaches. Mental status change or memory loss denied. Blurred vision , diplopia or vision loss absent. Vertigo, near syncope or imbalance denied. There is no numbness, tingling, or weakness in extremities.   No loss of control of bladder or bowels. Radicular type pain absent. No seizure stigmata. He has no significant tinnitus or hearing loss. There is been no loss of smell. There's been no loss of taste. No hematuria noted    Objective:   Physical Exam Pertinent or positive findings include: Alopecia is present. He has a IT consultant. There is a well-healed scar over the bridge of the nose. Cn 2-7 intact Strength equal & normal in upper & lower extremities Able to walk on heels and toes.   Balance normal  Romberg normal, finger to nose normal. Deep tendon reflexes are normal. Negative SLR.  General appearance :adequately nourished; in no distress.  Eyes: No conjunctival inflammation or scleral icterus is present.EOM & FOV WNL.Vision normal.  Ears: no hemotympanum.  Oral exam:  Lips and gums are healthy appearing.There is no oropharyngeal erythema or exudate noted. Dental hygiene is good.  Heart:  Normal rate and regular rhythm. S1 and S2 normal without gallop, murmur, click, rub or other extra sounds    Lungs:Chest clear to  auscultation; no wheezes, rhonchi,rales ,or rubs present.No increased work of breathing.   Abdomen: bowel sounds normal, soft and non-tender without masses, organomegaly or hernias noted.  No guarding or rebound. No flank tenderness to percussion.  Vascular : all pulses equal ; no bruits present.  Skin:Warm & dry.  Intact without suspicious lesions or rashes ; no tenting or jaundice   Lymphatic: No lymphadenopathy is noted about the head, neck, axilla.      Assessment & Plan:  #1 head injury w/o sequellae or neuro deficit  See AVS

## 2015-04-20 ENCOUNTER — Other Ambulatory Visit: Payer: Self-pay | Admitting: Internal Medicine

## 2015-06-01 ENCOUNTER — Encounter: Payer: Self-pay | Admitting: Family

## 2015-06-01 ENCOUNTER — Ambulatory Visit (INDEPENDENT_AMBULATORY_CARE_PROVIDER_SITE_OTHER)
Admission: RE | Admit: 2015-06-01 | Discharge: 2015-06-01 | Disposition: A | Discharge status: Home / Self Care | Payer: BLUE CROSS/BLUE SHIELD | Source: Ambulatory Visit | Attending: Family | Admitting: Family

## 2015-06-01 ENCOUNTER — Ambulatory Visit (INDEPENDENT_AMBULATORY_CARE_PROVIDER_SITE_OTHER): Payer: BLUE CROSS/BLUE SHIELD | Admitting: Family

## 2015-06-01 ENCOUNTER — Other Ambulatory Visit (INDEPENDENT_AMBULATORY_CARE_PROVIDER_SITE_OTHER): Payer: BLUE CROSS/BLUE SHIELD

## 2015-06-01 VITALS — BP 128/84 | HR 86 | Temp 98.0°F | Resp 18 | Ht 69.0 in | Wt 215.0 lb

## 2015-06-01 DIAGNOSIS — R1032 Left lower quadrant pain: Secondary | ICD-10-CM

## 2015-06-01 DIAGNOSIS — R1084 Generalized abdominal pain: Secondary | ICD-10-CM

## 2015-06-01 LAB — CBC WITH DIFFERENTIAL/PLATELET
Basophils Absolute: 0 10*3/uL (ref 0.0–0.1)
Basophils Relative: 0.4 % (ref 0.0–3.0)
Eosinophils Absolute: 0.2 10*3/uL (ref 0.0–0.7)
Eosinophils Relative: 2.4 % (ref 0.0–5.0)
HCT: 43.2 % (ref 39.0–52.0)
Hemoglobin: 14.6 g/dL (ref 13.0–17.0)
Lymphocytes Relative: 18.2 % (ref 12.0–46.0)
Lymphs Abs: 1.7 10*3/uL (ref 0.7–4.0)
MCHC: 33.8 g/dL (ref 30.0–36.0)
MCV: 90.1 fl (ref 78.0–100.0)
Monocytes Absolute: 0.7 10*3/uL (ref 0.1–1.0)
Monocytes Relative: 7.8 % (ref 3.0–12.0)
Neutro Abs: 6.5 10*3/uL (ref 1.4–7.7)
Neutrophils Relative %: 71.2 % (ref 43.0–77.0)
Platelets: 496 10*3/uL — ABNORMAL HIGH (ref 150.0–400.0)
RBC: 4.79 Mil/uL (ref 4.22–5.81)
RDW: 15.2 % (ref 11.5–15.5)
WBC: 9.1 10*3/uL (ref 4.0–10.5)

## 2015-06-01 LAB — AMYLASE: Amylase: 30 U/L (ref 27–131)

## 2015-06-01 LAB — LIPASE: Lipase: 54 U/L (ref 11.0–59.0)

## 2015-06-01 NOTE — Assessment & Plan Note (Signed)
Diffuse left sided abdominal pain with intensity greater in the left lower quadrant with concern for diverticulitis or colitis. Most recently completed course of metronidazole. CT scan with potential evidence of ileitis. Obtain x-ray to rule out obstruction or constipation. Obtain CBC, amylase, lipase. Consider secondary course of metronidazole and ciprofloxacin pending blood work and x-ray results. If symptoms worsen or fail to improve refer to gastroenterology for further assessment and workup.

## 2015-06-01 NOTE — Patient Instructions (Signed)
Thank you for choosing [facility].  Summary/Instructions:  Your prescription(s) have been submitted to your pharmacy or been printed and provided for you. Please take as directed and contact our office if you believe you are having problem(s) with the medication(s) or have any questions.  Please stop by the lab on the basement level of the building for your blood work. Your results will be released to MyChart (or called to you) after review, usually within 72 hours after test completion. If any changes need to be made, you will be notified at that same time.  If your symptoms worsen or fail to improve, please contact our office for further instruction, or in case of emergency go directly to the emergency room at the closest medical facility.     

## 2015-06-01 NOTE — Progress Notes (Signed)
Pre visit review using our clinic review tool, if applicable. No additional management support is needed unless otherwise documented below in the visit note. 

## 2015-06-01 NOTE — Progress Notes (Signed)
Subjective:    Patient ID: Tim Decker, male    DOB: 1956/02/24, 60 y.o.   MRN: [id number]  Chief Complaint  Patient presents with  . Abdominal Pain    has stomach pain and states that his stomach feels tight, loss of appetite, no energy, reflux and gas x2 weeks     HPI:  Zyel Lauriano is a 60 y.o. male who  has a past medical history of Pre-diabetes; Hyperlipidemia; and Hypertension. and presents today for an office follow up.  Recently in seen in [facility] for adominal and diagnosed with with gastroenteritis. CT performed at the time showed dilated small bowel loops at the level of the terminal ileum with wall thickening and enhancement of the terminal ileum raising the possibility for inflammatory enteritis although infectious ileitis cannot be ruled out. There is also mild reactive free fluid of the abdomen and pelvis. He was prescribed Flagyl, potassium and Zofran. All ED records were reviewed in detail.  He continues to experience pain that is generalized around his abdomen which has been going on for approximately 2 weeks time. He completed the course of metronidazole. Describes his stomach is feeling tight and has associated loss of appetite and nausea. No fevers, vomiting, diarrhea, or constipation. Continues to experience associated symptom of reflux with eating and drinking most of the time.   No Known Allergies   Current Outpatient Prescriptions on File Prior to Visit  Medication Sig Dispense Refill  . amLODipine (NORVASC) 10 MG tablet Take 1 tablet (10 mg total) by mouth daily. --- Please establish with new PCP for further refills 90 tablet 1  . aspirin 81 MG tablet Take 81 mg by mouth daily.      . benazepril (LOTENSIN) 40 MG tablet Take 1 tablet (40 mg total) by mouth daily. --- Please establish with new PCP for further refills 90 tablet 1  . chlorthalidone (HYGROTON) 25 MG tablet Take 1 tablet (25 mg total) by mouth daily. 30 tablet 2  . CIALIS 20 MG tablet  TAKE ONE TABLET BY MOUTH EVERY 3 DAYS AS NEEDED 6 each 2  . doxazosin (CARDURA) 2 MG tablet Take 1 tablet (2 mg total) by mouth daily. 90 tablet 1  . hydrochlorothiazide (MICROZIDE) 12.5 MG capsule Take 1 capsule (12.5 mg total) by mouth daily. 90 capsule 1  . Multiple Vitamin (MULTIVITAMIN) tablet Take 1 tablet by mouth daily.      . niacin 500 MG tablet Take 500 mg by mouth daily with breakfast.      . Omega-3 Fatty Acids (FISH OIL) 1000 MG CAPS Take by mouth daily.      . potassium chloride (K-DUR) 10 MEQ tablet Take 1 tablet (10 mEq total) by mouth once. 90 tablet 1  . vitamin E 400 UNIT capsule Take 400 Units by mouth daily.       No current facility-administered medications on file prior to visit.    Past Medical History  Diagnosis Date  . Pre-diabetes     A1c 6%  . Hyperlipidemia   . Hypertension      Past Surgical History  Procedure Laterality Date  . Tonsillectomy and adenoidectomy    . Wisdom tooth extraction      two extraction  . Colonoscopy with polypectomy  2009    Dr Leafy [facility]    Review of Systems  Constitutional: Positive for diaphoresis and activity change. Negative for fever, chills and unexpected weight change.  Gastrointestinal: Positive for nausea and abdominal pain.  Negative for diarrhea, constipation and blood in stool.      Objective:    BP 128/84 mmHg  Pulse 86  Temp(Src) 98 F (36.7 C) (Oral)  Resp 18  Ht  (1.753 m)  Wt 215 lb (97.523 kg)  BMI 31.74 kg/m2  SpO2 94% Nursing note and vital signs reviewed.  Physical Exam  Constitutional: He is oriented to person, place, and time. He appears well-developed and well-nourished. No distress.  Cardiovascular: Normal rate, regular rhythm, normal heart sounds and intact distal pulses.   Pulmonary/Chest: Effort normal and breath sounds normal.  Abdominal: Bowel sounds are normal. He exhibits distension. He exhibits no mass. There is no hepatosplenomegaly. There is tenderness in the left  upper quadrant and left lower quadrant. There is no rigidity, no guarding, no tenderness at McBurney's point and negative Murphy's sign.  Neurological: He is alert and oriented to person, place, and time.  Skin: Skin is warm and dry.  Psychiatric: He has a normal mood and affect. His behavior is normal. Judgment and thought content normal.       Assessment & Plan:   Problem List Items Addressed This Visit      Other   Abdominal pain, left lower quadrant - Primary    Diffuse left sided abdominal pain with intensity greater in the left lower quadrant with concern for diverticulitis or colitis. Most recently completed course of metronidazole. CT scan with potential evidence of ileitis. Obtain x-ray to rule out obstruction or constipation. Obtain CBC, amylase, lipase. Consider secondary course of metronidazole and ciprofloxacin pending blood work and x-ray results. If symptoms worsen or fail to improve refer to gastroenterology for further assessment and workup.      Relevant Orders   CBC w/Diff (Completed)   DG Abd 2 Views (Completed)   Amylase (Completed)   Lipase (Completed)

## 2015-06-02 MED ORDER — AMOXICILLIN-POT CLAVULANATE 875-125 MG PO TABS: 1.0000 | ORAL_TABLET | Freq: Two times a day (BID) | ORAL | Status: DC

## 2015-06-12 ENCOUNTER — Other Ambulatory Visit: Payer: Self-pay | Admitting: Internal Medicine

## 2015-06-14 NOTE — Telephone Encounter (Signed)
Patient is transferring over to [place] on 3/9.  Please send scripts to pharmacy.

## 2015-06-14 NOTE — Telephone Encounter (Signed)
Left message advising patient to call back to schedule appt/get established with new pcp---dr hopper retired---let Chrystopher Stangl know when patient makes appt so i can send chlorthalidone rx to pharm

## 2015-06-24 ENCOUNTER — Ambulatory Visit (INDEPENDENT_AMBULATORY_CARE_PROVIDER_SITE_OTHER): Payer: BLUE CROSS/BLUE SHIELD | Admitting: Family

## 2015-06-24 ENCOUNTER — Encounter: Payer: Self-pay | Admitting: Family

## 2015-06-24 VITALS — BP 122/82 | HR 89 | Temp 98.5°F | Ht 69.0 in | Wt 213.0 lb

## 2015-06-24 DIAGNOSIS — Z0000 Encounter for general adult medical examination without abnormal findings: Secondary | ICD-10-CM | POA: Diagnosis not present

## 2015-06-24 MED ORDER — CHLORTHALIDONE 25 MG PO TABS: 25.0000 mg | ORAL_TABLET | Freq: Every day | ORAL | Status: DC

## 2015-06-24 NOTE — Assessment & Plan Note (Signed)
1) Anticipatory Guidance: Discussed importance of wearing a seatbelt while driving and not texting while driving; changing batteries in smoke detector at least once annually; wearing suntan lotion when outside; eating a balanced and moderate diet; getting physical activity at least 30 minutes per day.  2) Immunizations / Screenings / Labs:  Declines shingles vaccination. All other immunizations are up to date per recommendations. Obtain Hepatitis C antibody for Hepatitis C screening. Obtain PSA for prostate cancer screening. Due for a vision exam encouraged to be completed independently. All other screenings are up to date per recommendations. Obtain CBC, CMET, Lipid profile and TSH.   Overall well exam with risk factors for cardiovascular disease including tobacco use, hypertension, and obesity. Discussed methods of smoking cessation and risk for health in the future. He is not currently ready to quit smoking. Hypertension is well controlled with current medication regimen. Recommend weight loss of 5-10% of current body weight loss through improving nutrition and increasing physical activity to goal of  30 minutes of moderate level activity daily. Continue other healthy lifestyle behaviors. Follow up prevention exam in 1 year. Follow up office visit for chronic conditions pending blood work as needed.

## 2015-06-24 NOTE — Progress Notes (Signed)
Pre visit review using our clinic review tool, if applicable. No additional management support is needed unless otherwise documented below in the visit note. 

## 2015-06-24 NOTE — Progress Notes (Signed)
Subjective:    Patient ID: Tim Decker, male    DOB: [birth year], 60 y.o.   MRN: [id number]  Chief Complaint  Patient presents with  . Annual Exam    HPI:  Tim Decker is a 60 y.o. male who presents today for an annual wellness visit.   1) Health Maintenance -   Diet - Averages about 2 meals per day; 2-3 cups of caffeine; usually eats out.   Exercise - No structured exercise.    2) Preventative Exams / Immunizations:  Dental -- Up to date  Vision -- Due for exam    Health Maintenance  Topic Date Due  . Hepatitis C Screening  [birth year]  . HIV Screening  03/19/1970  . ZOSTAVAX  03/20/2015  . INFLUENZA VACCINE  11/16/2015  . COLONOSCOPY  02/13/2018  . TETANUS/TDAP  04/21/2020  Declines shingles  Immunization History  Administered Date(s) Administered  . Influenza Whole 02/03/2008  . Influenza-Unspecified 02/17/2015  . Td 04/21/2010   No Known Allergies   Outpatient Prescriptions Prior to Visit  Medication Sig Dispense Refill  . amLODipine (NORVASC) 10 MG tablet Take 1 tablet (10 mg total) by mouth daily. --- Please establish with new PCP for further refills 90 tablet 1  . aspirin 81 MG tablet Take 81 mg by mouth daily.      . benazepril (LOTENSIN) 40 MG tablet Take 1 tablet (40 mg total) by mouth daily. --- Please establish with new PCP for further refills 90 tablet 1  . doxazosin (CARDURA) 2 MG tablet Take 1 tablet (2 mg total) by mouth daily. 90 tablet 1  . Multiple Vitamin (MULTIVITAMIN) tablet Take 1 tablet by mouth daily.      . niacin 500 MG tablet Take 500 mg by mouth daily with breakfast.      . Omega-3 Fatty Acids (FISH OIL) 1000 MG CAPS Take by mouth daily.      . potassium chloride (K-DUR) 10 MEQ tablet Take 1 tablet (10 mEq total) by mouth once. 90 tablet 1  . vitamin E 400 UNIT capsule Take 400 Units by mouth daily.      . chlorthalidone (HYGROTON) 25 MG tablet TAKE ONE TABLET BY MOUTH ONCE DAILY 30 tablet 0  . hydrochlorothiazide  (MICROZIDE) 12.5 MG capsule Take 1 capsule (12.5 mg total) by mouth daily. 90 capsule 1  . CIALIS 20 MG tablet TAKE ONE TABLET BY MOUTH EVERY 3 DAYS AS NEEDED (Patient not taking: Reported on 06/24/2015) 6 each 2  . amoxicillin-clavulanate (AUGMENTIN) 875-125 MG tablet Take 1 tablet by mouth 2 (two) times daily. 20 tablet 0   No facility-administered medications prior to visit.     Past Medical History  Diagnosis Date  . Pre-diabetes     A1c 6%  . Hyperlipidemia   . Hypertension      Past Surgical History  Procedure Laterality Date  . Tonsillectomy and adenoidectomy    . Wisdom tooth extraction      two extraction  . Colonoscopy with polypectomy  2009    Dr Leafy Ro [facility]     Family History  Problem Relation Age of Onset  . Heart attack Father 59  . Diabetes Mother   . Heart attack Paternal Grandfather     ? 9  . Cancer Neg Hx   . Stroke Neg Hx      Social History   Social History  . Marital Status: Divorced    Spouse Name: N/A  . Number of Children: N/A  .  Years of Education: N/A   Occupational History  . Not on file.   Social History Main Topics  . Smoking status: Current Every Day Smoker    Last Attempt to Quit: 04/18/1991  . Smokeless tobacco: Not on file     Comment: smoked 1972-1993, up to 1/2 ppd. As of 02/24/15 5 cig/day  . Alcohol Use: Yes     Comment:  1 mixed drink/week  . Drug Use: Yes  . Sexual Activity: Not on file   Other Topics Concern  . Not on file   Social History Narrative    Review of Systems  Constitutional: Denies fever, chills, fatigue, or significant weight gain/loss. HENT: Head: Denies headache or neck pain Ears: Denies changes in hearing, ringing in ears, earache, drainage Nose: Denies discharge, stuffiness, itching, nosebleed, sinus pain Throat: Denies sore throat, hoarseness, dry mouth, sores, thrush Eyes: Denies loss/changes in vision, pain, redness, blurry/double vision, flashing lights Cardiovascular: Denies  chest pain/discomfort, tightness, palpitations, shortness of breath with activity, difficulty lying down, swelling, sudden awakening with shortness of breath Respiratory: Denies shortness of breath, cough, sputum production, wheezing Gastrointestinal: Denies dysphasia, heartburn, change in appetite, nausea, change in bowel habits, rectal bleeding, constipation, diarrhea, yellow skin or eyes Genitourinary: Denies frequency, urgency, burning/pain, blood in urine, incontinence, change in urinary strength. Musculoskeletal: Denies muscle/joint pain, stiffness, back pain, redness or swelling of joints, trauma Skin: Denies rashes, lumps, itching, dryness, color changes, or hair/nail changes Neurological: Denies dizziness, fainting, seizures, weakness, numbness, tingling, tremor Psychiatric - Denies nervousness, stress, depression or memory loss Endocrine: Denies heat or cold intolerance, sweating, frequent urination, excessive thirst, changes in appetite Hematologic: Denies ease of bruising or bleeding     Objective:     BP 122/82 mmHg  Pulse 89  Temp(Src) 98.5 F (36.9 C) (Oral)  Ht  (1.753 m)  Wt 213 lb (96.616 kg)  BMI 31.44 kg/m2  SpO2 98% Nursing note and vital signs reviewed.  Physical Exam  Constitutional: He is oriented to person, place, and time. He appears well-developed and well-nourished.  HENT:  Head: Normocephalic.  Right Ear: Hearing, tympanic membrane, external ear and ear canal normal.  Left Ear: Hearing, tympanic membrane, external ear and ear canal normal.  Nose: Nose normal.  Mouth/Throat: Uvula is midline, oropharynx is clear and moist and mucous membranes are normal.  Eyes: Conjunctivae and EOM are normal. Pupils are equal, round, and reactive to light.  Neck: Neck supple. No JVD present. No tracheal deviation present. No thyromegaly present.  Cardiovascular: Normal rate, regular rhythm, normal heart sounds and intact distal pulses.   Pulmonary/Chest: Effort  normal and breath sounds normal.  Abdominal: Soft. Bowel sounds are normal. He exhibits no distension and no mass. There is no tenderness. There is no rebound and no guarding.  Musculoskeletal: Normal range of motion. He exhibits no edema or tenderness.  Lymphadenopathy:    He has no cervical adenopathy.  Neurological: He is alert and oriented to person, place, and time. He has normal reflexes. No cranial nerve deficit. He exhibits normal muscle tone. Coordination normal.  Skin: Skin is warm and dry.  Psychiatric: He has a normal mood and affect. His behavior is normal. Judgment and thought content normal.       Assessment & Plan:   Problem List Items Addressed This Visit      Other   Routine general medical examination at a health care facility - Primary    1) Anticipatory Guidance: Discussed importance of wearing a seatbelt while  driving and not texting while driving; changing batteries in smoke detector at least once annually; wearing suntan lotion when outside; eating a balanced and moderate diet; getting physical activity at least 30 minutes per day.  2) Immunizations / Screenings / Labs:  Declines shingles vaccination. All other immunizations are up to date per recommendations. Obtain Hepatitis C antibody for Hepatitis C screening. Obtain PSA for prostate cancer screening. Due for a vision exam encouraged to be completed independently. All other screenings are up to date per recommendations. Obtain CBC, CMET, Lipid profile and TSH.   Overall well exam with risk factors for cardiovascular disease including tobacco use, hypertension, and obesity. Discussed methods of smoking cessation and risk for health in the future. He is not currently ready to quit smoking. Hypertension is well controlled with current medication regimen. Recommend weight loss of 5-10% of current body weight loss through improving nutrition and increasing physical activity to goal of  30 minutes of moderate level  activity daily. Continue other healthy lifestyle behaviors. Follow up prevention exam in 1 year. Follow up office visit for chronic conditions pending blood work as needed.        Relevant Orders   CBC   Comprehensive metabolic panel   TSH   PSA   Lipid panel   Hepatitis C antibody

## 2015-06-24 NOTE — Patient Instructions (Addendum)
Thank you for choosing [facility].  Summary/Instructions:  Please stop taking the hydrochlorothiazide.   Your prescription(s) have been submitted to your pharmacy or been printed and provided for you. Please take as directed and contact our office if you believe you are having problem(s) with the medication(s) or have any questions.  Please stop by the lab on the basement level of the building for your blood work. Your results will be released to MyChart (or called to you) after review, usually within 72 hours after test completion. If any changes need to be made, you will be notified at that same time.  If your symptoms worsen or fail to improve, please contact our office for further instruction, or in case of emergency go directly to the emergency room at the closest medical facility.    Health Maintenance, Male A healthy lifestyle and preventative care can promote health and wellness.  Maintain regular health, dental, and eye exams.  Eat a healthy diet. Foods like vegetables, fruits, whole grains, low-fat dairy products, and lean protein foods contain the nutrients you need and are low in calories. Decrease your intake of foods high in solid fats, added sugars, and salt. Get information about a proper diet from your health care provider, if necessary.  Regular physical exercise is one of the most important things you can do for your health. Most adults should get at least 150 minutes of moderate-intensity exercise (any activity that increases your heart rate and causes you to sweat) each week. In addition, most adults need muscle-strengthening exercises on 2 or more days a week.   Maintain a healthy weight. The body mass index (BMI) is a screening tool to identify possible weight problems. It provides an estimate of body fat based on height and weight. Your health care provider can find your BMI and can help you achieve or maintain a healthy weight. For males 20 years and  older:  A BMI below 18.5 is considered underweight.  A BMI of 18.5 to 24.9 is normal.  A BMI of 25 to 29.9 is considered overweight.  A BMI of 30 and above is considered obese.  Maintain normal blood lipids and cholesterol by exercising and minimizing your intake of saturated fat. Eat a balanced diet with plenty of fruits and vegetables. Blood tests for lipids and cholesterol should begin at age 13 and be repeated every 5 years. If your lipid or cholesterol levels are high, you are over age 60, or you are at high risk for heart disease, you may need your cholesterol levels checked more frequently.Ongoing high lipid and cholesterol levels should be treated with medicines if diet and exercise are not working.  If you smoke, find out from your health care provider how to quit. If you do not use tobacco, do not start.  Lung cancer screening is recommended for adults aged 55-80 years who are at high risk for developing lung cancer because of a history of smoking. A yearly low-dose CT scan of the lungs is recommended for people who have at least a 30-pack-year history of smoking and are current smokers or have quit within the past 15 years. A pack year of smoking is smoking an average of 1 pack of cigarettes a day for 1 year (for example, a 30-pack-year history of smoking could mean smoking 1 pack a day for 30 years or 2 packs a day for 15 years). Yearly screening should continue until the smoker has stopped smoking for at least 15 years. Yearly screening  should be stopped for people who develop a health problem that would prevent them from having lung cancer treatment.  If you choose to drink alcohol, do not have more than 2 drinks per day. One drink is considered to be 12 oz (360 mL) of beer, 5 oz (150 mL) of wine, or 1.5 oz (45 mL) of liquor.  Avoid the use of street drugs. Do not share needles with anyone. Ask for help if you need support or instructions about stopping the use of drugs.  High  blood pressure causes heart disease and increases the risk of stroke. High blood pressure is more likely to develop in:  People who have blood pressure in the end of the normal range (100-139/85-89 mm Hg).  People who are overweight or obese.  People who are African American.  If you are 54-47 years of age, have your blood pressure checked every 3-5 years. If you are 39 years of age or older, have your blood pressure checked every year. You should have your blood pressure measured twice--once when you are at a hospital or clinic, and once when you are not at a hospital or clinic. Record the average of the two measurements. To check your blood pressure when you are not at a hospital or clinic, you can use:  An automated blood pressure machine at a pharmacy.  A home blood pressure monitor.  If you are 3-38 years old, ask your health care provider if you should take aspirin to prevent heart disease.  Diabetes screening involves taking a blood sample to check your fasting blood sugar level. This should be done once every 3 years after age 7 if you are at a normal weight and without risk factors for diabetes. Testing should be considered at a younger age or be carried out more frequently if you are overweight and have at least 1 risk factor for diabetes.  Colorectal cancer can be detected and often prevented. Most routine colorectal cancer screening begins at the age of 63 and continues through age 36. However, your health care provider may recommend screening at an earlier age if you have risk factors for colon cancer. On a yearly basis, your health care provider may provide home test kits to check for hidden blood in the stool. A small camera at the end of a tube may be used to directly examine the colon (sigmoidoscopy or colonoscopy) to detect the earliest forms of colorectal cancer. Talk to your health care provider about this at age 21 when routine screening begins. A direct exam of the colon  should be repeated every 5-10 years through age 109, unless early forms of precancerous polyps or small growths are found.  People who are at an increased risk for hepatitis B should be screened for this virus. You are considered at high risk for hepatitis B if:  You were born in a country where hepatitis B occurs often. Talk with your health care provider about which countries are considered high risk.  Your parents were born in a high-risk country and you have not received a shot to protect against hepatitis B (hepatitis B vaccine).  You have HIV or AIDS.  You use needles to inject street drugs.  You live with, or have sex with, someone who has hepatitis B.  You are a man who has sex with other men (MSM).  You get hemodialysis treatment.  You take certain medicines for conditions like cancer, organ transplantation, and autoimmune conditions.  Hepatitis C blood testing  is recommended for all people born from [birth year] through 1966 and any individual with known risk factors for hepatitis C.  Healthy men should no longer receive prostate-specific antigen (PSA) blood tests as part of routine cancer screening. Talk to your health care provider about prostate cancer screening.  Testicular cancer screening is not recommended for adolescents or adult males who have no symptoms. Screening includes self-exam, a health care provider exam, and other screening tests. Consult with your health care provider about any symptoms you have or any concerns you have about testicular cancer.  Practice safe sex. Use condoms and avoid high-risk sexual practices to reduce the spread of sexually transmitted infections (STIs).  You should be screened for STIs, including gonorrhea and chlamydia if:  You are sexually active and are younger than 24 years.  You are older than 24 years, and your health care provider tells you that you are at risk for this type of infection.  Your sexual activity has changed since you  were last screened, and you are at an increased risk for chlamydia or gonorrhea. Ask your health care provider if you are at risk.  If you are at risk of being infected with HIV, it is recommended that you take a prescription medicine daily to prevent HIV infection. This is called pre-exposure prophylaxis (PrEP). You are considered at risk if:  You are a man who has sex with other men (MSM).  You are a heterosexual man who is sexually active with multiple partners.  You take drugs by injection.  You are sexually active with a partner who has HIV.  Talk with your health care provider about whether you are at high risk of being infected with HIV. If you choose to begin PrEP, you should first be tested for HIV. You should then be tested every 3 months for as long as you are taking PrEP.  Use sunscreen. Apply sunscreen liberally and repeatedly throughout the day. You should seek shade when your shadow is shorter than you. Protect yourself by wearing long sleeves, pants, a wide-brimmed hat, and sunglasses year round whenever you are outdoors.  Tell your health care provider of new moles or changes in moles, especially if there is a change in shape or color. Also, tell your health care provider if a mole is larger than the size of a pencil eraser.  A one-time screening for abdominal aortic aneurysm (AAA) and surgical repair of large AAAs by ultrasound is recommended for men aged 65-75 years who are current or former smokers.  Stay current with your vaccines (immunizations).   This information is not intended to replace advice given to you by your health care provider. Make sure you discuss any questions you have with your health care provider.   Document Released: 09/30/2007 Document Revised: 04/24/2014 Document Reviewed: 08/29/2010 Elsevier Interactive Patient Education [employer]

## 2015-07-28 ENCOUNTER — Other Ambulatory Visit (INDEPENDENT_AMBULATORY_CARE_PROVIDER_SITE_OTHER): Payer: BLUE CROSS/BLUE SHIELD

## 2015-07-28 DIAGNOSIS — I10 Essential (primary) hypertension: Secondary | ICD-10-CM

## 2015-07-28 DIAGNOSIS — E782 Mixed hyperlipidemia: Secondary | ICD-10-CM | POA: Diagnosis not present

## 2015-07-28 DIAGNOSIS — N400 Enlarged prostate without lower urinary tract symptoms: Secondary | ICD-10-CM | POA: Diagnosis not present

## 2015-07-28 DIAGNOSIS — Z0000 Encounter for general adult medical examination without abnormal findings: Secondary | ICD-10-CM

## 2015-07-28 LAB — COMPREHENSIVE METABOLIC PANEL
ALT: 31 U/L (ref 0–53)
AST: 21 U/L (ref 0–37)
Albumin: 4.3 g/dL (ref 3.5–5.2)
Alkaline Phosphatase: 46 U/L (ref 39–117)
BUN: 15 mg/dL (ref 6–23)
CO2: 31 mEq/L (ref 19–32)
Calcium: 9.8 mg/dL (ref 8.4–10.5)
Chloride: 103 mEq/L (ref 96–112)
Creatinine, Ser: 1.08 mg/dL (ref 0.40–1.50)
GFR: 74.04 mL/min (ref >60.00)
Glucose, Bld: 113 mg/dL — ABNORMAL HIGH (ref 70–99)
Potassium: 3.3 mEq/L — ABNORMAL LOW (ref 3.5–5.1)
Sodium: 142 mEq/L (ref 135–145)
Total Bilirubin: 0.5 mg/dL (ref 0.2–1.2)
Total Protein: 6.9 g/dL (ref 6.0–8.3)

## 2015-07-28 LAB — CBC
HCT: 45.9 % (ref 39.0–52.0)
Hemoglobin: 15.7 g/dL (ref 13.0–17.0)
MCHC: 34.2 g/dL (ref 30.0–36.0)
MCV: 88.7 fl (ref 78.0–100.0)
Platelets: 302 10*3/uL (ref 150.0–400.0)
RBC: 5.18 Mil/uL (ref 4.22–5.81)
RDW: 15.2 % (ref 11.5–15.5)
WBC: 6.6 10*3/uL (ref 4.0–10.5)

## 2015-07-28 LAB — TSH: TSH: 1.8 u[IU]/mL (ref 0.35–4.50)

## 2015-07-28 LAB — HEPATITIS C ANTIBODY: HCV Ab: NEGATIVE

## 2015-07-28 LAB — LIPID PANEL
Cholesterol: 195 mg/dL (ref 0–200)
HDL: 28.4 mg/dL — ABNORMAL LOW (ref >39.00)
Total CHOL/HDL Ratio: 7
Triglycerides: 417 mg/dL — ABNORMAL HIGH (ref 0.0–149.0)

## 2015-07-28 LAB — LDL CHOLESTEROL, DIRECT: Direct LDL: 95 mg/dL

## 2015-07-28 LAB — PSA: PSA: 2.44 ng/mL (ref 0.10–4.00)

## 2015-08-01 ENCOUNTER — Encounter: Payer: Self-pay | Admitting: Family

## 2015-09-03 ENCOUNTER — Other Ambulatory Visit: Payer: Self-pay | Admitting: *Deleted

## 2015-09-03 MED ORDER — TADALAFIL 20 MG PO TABS: ORAL_TABLET | ORAL | Status: DC

## 2015-09-03 NOTE — Telephone Encounter (Signed)
Receive call pt requesting refill on his Cialis...Raechel Chute

## 2015-09-20 ENCOUNTER — Telehealth: Payer: Self-pay

## 2015-09-20 NOTE — Telephone Encounter (Signed)
PA initiated via CoverMyMeds key [id number]

## 2015-09-20 NOTE — Telephone Encounter (Signed)
APPROVED through 04/16/2038 

## 2015-10-02 ENCOUNTER — Other Ambulatory Visit: Payer: Self-pay | Admitting: Internal Medicine

## 2015-11-07 ENCOUNTER — Other Ambulatory Visit: Payer: Self-pay | Admitting: Internal Medicine

## 2015-11-18 ENCOUNTER — Other Ambulatory Visit: Payer: Self-pay | Admitting: Internal Medicine

## 2016-01-27 ENCOUNTER — Other Ambulatory Visit: Payer: Self-pay | Admitting: Family

## 2016-07-05 ENCOUNTER — Ambulatory Visit (INDEPENDENT_AMBULATORY_CARE_PROVIDER_SITE_OTHER): Payer: BLUE CROSS/BLUE SHIELD | Admitting: Family

## 2016-07-05 VITALS — BP 160/98 | HR 109 | Temp 97.8°F | Resp 18 | Ht 69.0 in | Wt 234.0 lb

## 2016-07-05 DIAGNOSIS — M67912 Unspecified disorder of synovium and tendon, left shoulder: Secondary | ICD-10-CM | POA: Diagnosis not present

## 2016-07-05 DIAGNOSIS — I10 Essential (primary) hypertension: Secondary | ICD-10-CM

## 2016-07-05 MED ORDER — METOPROLOL SUCCINATE ER 25 MG PO TB24: 25.0000 mg | ORAL_TABLET | Freq: Every day | ORAL | 1 refills | Status: DC

## 2016-07-05 MED ORDER — IBUPROFEN-FAMOTIDINE 800-26.6 MG PO TABS: 1.0000 | ORAL_TABLET | Freq: Three times a day (TID) | ORAL | 0 refills | Status: DC | PRN

## 2016-07-05 NOTE — Progress Notes (Signed)
Subjective:    Patient ID: Tim Decker, male    DOB: [birth year], 62 y.o.   MRN: [id number]  Chief Complaint  Patient presents with  . Shoulder Pain    left shoulder pain x1 month that goes down to elbow and lower part of arm     HPI:  Tim Decker is a 61 y.o. male who  has a past medical history of Hyperlipidemia; Hypertension; and Pre-diabetes. and presents today for an acute office visit.  1.) Shoulder pain - This is a new problem. Associated symptom of pain located in his left shoulder and upper arm has been going on for about 1 month. The pain is described as sharp pains that radiate down to his elbow on occasion. No trauma or injury that he can recall. 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 Does have limited range of motion. Modifying factors include Flexeril and demoral which have helped a little. No numbness or tingling. Right hand dominant.   2.) Hypertension - Currently maintained on benazepril, chlorthalidone, and amlodipine. Reports taking the medications as prescribed and denies adverse side effects. Denies worst headache of life with no symptoms of end organ damage. Has not recently checked his blood pressure at home. Working on following a low sodium diet.  BP Readings from Last 3 Encounters:  07/05/16 (!) 160/98  06/24/15 122/82  06/01/15 128/84     No Known Allergies    Outpatient Medications Prior to Visit  Medication Sig Dispense Refill  . amLODipine (NORVASC) 10 MG tablet Take 1 tablet (10 mg total) by mouth daily. 90 tablet 2  . aspirin 81 MG tablet Take 81 mg by mouth daily.      . benazepril (LOTENSIN) 40 MG tablet Take 1 tablet (40 mg total) by mouth daily. 90 tablet 1  . chlorthalidone (HYGROTON) 25 MG tablet TAKE ONE TABLET BY MOUTH ONCE DAILY 90 tablet 1  . doxazosin (CARDURA) 2 MG tablet TAKE ONE TABLET BY MOUTH ONCE DAILY 90 tablet 2  . Multiple Vitamin (MULTIVITAMIN) tablet Take 1 tablet by mouth daily.      . niacin 500 MG tablet  Take 500 mg by mouth daily with breakfast.      . Omega-3 Fatty Acids (FISH OIL) 1000 MG CAPS Take by mouth daily.      . potassium chloride (K-DUR) 10 MEQ tablet TAKE ONE TABLET BY MOUTH ONCE DAILY 90 tablet 2  . tadalafil (CIALIS) 20 MG tablet TAKE ONE TABLET BY MOUTH EVERY 3 DAYS AS NEEDED 6 tablet 2  . vitamin E 400 UNIT capsule Take 400 Units by mouth daily.       No facility-administered medications prior to visit.       Past Surgical History:  Procedure Laterality Date  . colonoscopy with polypectomy  2009   Dr [facility]  . TONSILLECTOMY AND ADENOIDECTOMY    . WISDOM TOOTH EXTRACTION     two extraction      Past Medical History:  Diagnosis Date  . Hyperlipidemia   . Hypertension   . Pre-diabetes    A1c 6%      Review of Systems  Constitutional: Negative for chills and fever.  Eyes:       Negative for changes in vision  Respiratory: Negative for cough, chest tightness and wheezing.   Cardiovascular: Negative for chest pain, palpitations and leg swelling.  Musculoskeletal: Negative for neck pain.       Positive for left shoulder pain.  Neurological: Negative for dizziness, weakness, light-headedness and numbness.      Objective:    BP (!) 160/98 (BP Location: Right Arm, Patient Position: Sitting, Cuff Size: Large)   Pulse (!) 109   Temp 97.8 F (36.6 C) (Oral)   Resp 18   Ht 5\' 9"  (1.753 m)   Wt 234 lb (106.1 kg)   SpO2 95%   BMI 34.56 kg/m  Nursing note and vital signs reviewed.  Physical Exam  Constitutional: He is oriented to person, place, and time. He appears well-developed and well-nourished. No distress.  Cardiovascular: Normal rate, regular rhythm, normal heart sounds and intact distal pulses.   Pulmonary/Chest: Effort normal and breath sounds normal.  Musculoskeletal:  Left shoulder - no obvious deformity, discoloration, or edema. No palpable tenderness able to be elicited. Range of motion decreased to 90 of abduction. No crepitus  or deformity. Passive range of motion also limited greater than 90 of flexion and abduction. Distal pulses and sensation are intact and appropriate. Negative Neer's impingement; negative Leanord Asal; positive empty can.  Neurological: He is alert and oriented to person, place, and time.  Skin: Skin is warm and dry.  Psychiatric: He has a normal mood and affect. His behavior is normal. Judgment and thought content normal.    Procedure: Corticosteroid injection of the left shoulder  Description: Informed consent was obtained with discussion including risks and benefits of the procedure. Patient verbally wished to continued. Time out was performed. The site was identified and marked for the posterior lateral approach. It was cleansed with betadine using a concentric circular pattern. Skin anesthesia was applied using cold spray applied for 10 seconds. Injection of 55ml :4 ml of Kenalog (40 mg / ml) to Sensoricaine was injected following aspiration with no return noted. Following the injection there was instant relief and increased range of motion confirming appropriate placement. A bandage was applied to the area and post care instructions were provided. The procedure was tolerated well with no complications.       Assessment & Plan:   Problem List Items Addressed This Visit      Cardiovascular and Mediastinum   Essential hypertension    Blood pressure remains elevated above goal 140/90 with current medication regimen and no adverse side effects. Not currently checking blood pressure at home. Denies worse headache of life with no new symptoms of end organ damage noted on physical exam. Continue current dosage of amlodipine, benazepril, and chlorthalidone. Start metoprolol. Encouraged to monitor blood pressure at home and follow low-sodium diet. Follow-up in 2 weeks or sooner if needed for a blood pressure check with blood pressure machine.      Relevant Medications   metoprolol succinate  (TOPROL-XL) 25 MG 24 hr tablet     Musculoskeletal and Integument   Tendinopathy of left rotator cuff - Primary    Symptoms and exam consistent with rotator cuff tendinopathy. In office injection of 40 mg of Kenalog provided without complication. Recommend icing regimen, home exercise therapy, and sample of Duexis provided. If symptoms worsen or do not improve consider imaging and/or physical therapy.      Relevant Medications   Ibuprofen-Famotidine 800-26.6 MG TABS       I am having Mr. Klotz start on metoprolol succinate and Ibuprofen-Famotidine. I am also having him maintain his niacin, Fish Oil, vitamin E, aspirin, multivitamin, tadalafil, doxazosin, potassium chloride, amLODipine, benazepril, and chlorthalidone.   Meds ordered this encounter  Medications  . metoprolol succinate (TOPROL-XL) 25 MG 24 hr tablet  Sig: Take 1 tablet (25 mg total) by mouth daily.    Dispense:  30 tablet    Refill:  1    Order Specific Question:   Supervising Provider    Answer:   Hillard Danker A [[id number]]  . Ibuprofen-Famotidine 800-26.6 MG TABS    Sig: Take 1 tablet by mouth 3 (three) times daily as needed.    Dispense:  9 tablet    Refill:  0    Order Specific Question:   Supervising Provider    Answer:   Hillard Danker A [[id number]]     Follow-up: Return in about 2 weeks (around 07/19/2016), or if symptoms worsen or fail to improve.  Jeanine Luz, FNP

## 2016-07-05 NOTE — Patient Instructions (Signed)
Thank you for choosing [facility].  SUMMARY AND INSTRUCTIONS:  No heavy lifting for the next 24-48 hours with the left shoulder.  Ice 20 minutes every 2 hours and after activity.  Duexis 3 times daily until completed.  Home exercise therapy.  Monitor your blood pressure at home. Record values. Take at different times throughout the day.  Start metoprolol daily.   Medication:  Your prescription(s) have been submitted to your pharmacy or been printed and provided for you. Please take as directed and contact our office if you believe you are having problem(s) with the medication(s) or have any questions.  Follow up:  If your symptoms worsen or fail to improve, please contact our office for further instruction, or in case of emergency go directly to the emergency room at the closest medical facility.     Shoulder Impingement Syndrome Rehab Ask your health care provider which exercises are safe for you. Do exercises exactly as told by your health care provider and adjust them as directed. It is normal to feel mild stretching, pulling, tightness, or discomfort as you do these exercises, but you should stop right away if you feel sudden pain or your pain gets worse.Do not begin these exercises until told by your health care provider. Stretching and range of motion exercise This exercise warms up your muscles and joints and improves the movement and flexibility of your shoulder. This exercise also helps to relieve pain and stiffness. Exercise A: Passive horizontal adduction   1. Sit or stand and pull your left / right elbow across your chest, toward your other shoulder. Stop when you feel a gentle stretch in the back of your shoulder and upper arm.  Keep your arm at shoulder height.  Keep your arm as close to your body as you comfortably can. 2. Hold for __________ seconds. 3. Slowly return to the starting position. Repeat __________ times. Complete this exercise __________  times a day. Strengthening exercises These exercises build strength and endurance in your shoulder. Endurance is the ability to use your muscles for a long time, even after they get tired. Exercise B: External rotation, isometric  1. Stand or sit in a doorway, facing the door frame. 2. Bend your left / right elbow and place the back of your wrist against the door frame. Only your wrist should be touching the frame. Keep your upper arm at your side. 3. Gently press your wrist against the door frame, as if you are trying to push your arm away from your abdomen.  Avoid shrugging your shoulder while you press your hand against the door frame. Keep your shoulder blade tucked down toward the middle of your back. 4. Hold for __________ seconds. 5. Slowly release the tension, and relax your muscles completely before you do the exercise again. Repeat __________ times. Complete this exercise __________ times a day. Exercise C: Internal rotation, isometric   1. Stand or sit in a doorway, facing the door frame. 2. Bend your left / right elbow and place the inside of your wrist against the door frame. Only your wrist should be touching the frame. Keep your upper arm at your side. 3. Gently press your wrist against the door frame, as if you are trying to push your arm toward your abdomen.  Avoid shrugging your shoulder while you press your hand against the door frame. Keep your shoulder blade tucked down toward the middle of your back. 4. Hold for __________ seconds. 5. Slowly release the tension, and relax  your muscles completely before you do the exercise again. Repeat __________ times. Complete this exercise __________ times a day. Exercise D: Scapular protraction, supine   1. Lie on your back on a firm surface. Hold a __________ weight in your left / right hand. 2. Raise your left / right arm straight into the air so your hand is directly above your shoulder joint. 3. Push the weight into the air so  your shoulder lifts off of the surface that you are lying on. Do not move your head, neck, or back. 4. Hold for __________ seconds. 5. Slowly return to the starting position. Let your muscles relax completely before you repeat this exercise. Repeat __________ times. Complete this exercise __________ times a day. Exercise E: Scapular retraction   1. Sit in a stable chair without armrests, or stand. 2. Secure an exercise band to a stable object in front of you so the band is at shoulder height. 3. Hold one end of the exercise band in each hand. Your palms should face down. 4. Squeeze your shoulder blades together and move your elbows slightly behind you. Do not shrug your shoulders while you do this. 5. Hold for __________ seconds. 6. Slowly return to the starting position. Repeat __________ times. Complete this exercise __________ times a day. Exercise F: Shoulder extension   1. Sit in a stable chair without armrests, or stand. 2. Secure an exercise band to a stable object in front of you where the band is above shoulder height. 3. Hold one end of the exercise band in each hand. 4. Straighten your elbows and lift your hands up to shoulder height. 5. Squeeze your shoulder blades together and pull your hands down to the sides of your thighs. Stop when your hands are straight down by your sides. Do not let your hands go behind your body. 6. Hold for __________ seconds. 7. Slowly return to the starting position. Repeat __________ times. Complete this exercise __________ times a day. This information is not intended to replace advice given to you by your health care provider. Make sure you discuss any questions you have with your health care provider. Document Released: 04/03/2005 Document Revised: 12/09/2015 Document Reviewed: 03/06/2015 Elsevier Interactive Patient Education  2017 [employer].

## 2016-07-05 NOTE — Assessment & Plan Note (Signed)
Symptoms and exam consistent with rotator cuff tendinopathy. In office injection of 40 mg of Kenalog provided without complication. Recommend icing regimen, home exercise therapy, and sample of Duexis provided. If symptoms worsen or do not improve consider imaging and/or physical therapy.

## 2016-07-05 NOTE — Assessment & Plan Note (Signed)
Blood pressure remains elevated above goal 140/90 with current medication regimen and no adverse side effects. Not currently checking blood pressure at home. Denies worse headache of life with no new symptoms of end organ damage noted on physical exam. Continue current dosage of amlodipine, benazepril, and chlorthalidone. Start metoprolol. Encouraged to monitor blood pressure at home and follow low-sodium diet. Follow-up in 2 weeks or sooner if needed for a blood pressure check with blood pressure machine.

## 2016-07-12 ENCOUNTER — Other Ambulatory Visit: Payer: Self-pay | Admitting: Family

## 2016-07-26 ENCOUNTER — Ambulatory Visit (INDEPENDENT_AMBULATORY_CARE_PROVIDER_SITE_OTHER): Payer: BLUE CROSS/BLUE SHIELD | Admitting: Family

## 2016-07-26 ENCOUNTER — Other Ambulatory Visit (INDEPENDENT_AMBULATORY_CARE_PROVIDER_SITE_OTHER): Payer: BLUE CROSS/BLUE SHIELD

## 2016-07-26 VITALS — BP 120/82 | HR 95 | Temp 97.9°F | Resp 16 | Ht 69.0 in | Wt 227.0 lb

## 2016-07-26 DIAGNOSIS — I10 Essential (primary) hypertension: Secondary | ICD-10-CM

## 2016-07-26 DIAGNOSIS — Z0000 Encounter for general adult medical examination without abnormal findings: Secondary | ICD-10-CM | POA: Diagnosis not present

## 2016-07-26 DIAGNOSIS — R7989 Other specified abnormal findings of blood chemistry: Secondary | ICD-10-CM

## 2016-07-26 LAB — LIPID PANEL
Cholesterol: 218 mg/dL — ABNORMAL HIGH (ref 0–200)
HDL: 32.6 mg/dL — ABNORMAL LOW (ref >39.00)
NonHDL: 185.54
Total CHOL/HDL Ratio: 7
Triglycerides: 299 mg/dL — ABNORMAL HIGH (ref 0.0–149.0)
VLDL: 59.8 mg/dL — ABNORMAL HIGH (ref 0.0–40.0)

## 2016-07-26 LAB — COMPREHENSIVE METABOLIC PANEL
ALT: 28 U/L (ref 0–53)
AST: 19 U/L (ref 0–37)
Albumin: 4.3 g/dL (ref 3.5–5.2)
Alkaline Phosphatase: 45 U/L (ref 39–117)
BUN: 17 mg/dL (ref 6–23)
CO2: 32 mEq/L (ref 19–32)
Calcium: 9.9 mg/dL (ref 8.4–10.5)
Chloride: 102 mEq/L (ref 96–112)
Creatinine, Ser: 1.24 mg/dL (ref 0.40–1.50)
GFR: 62.92 mL/min (ref >60.00)
Glucose, Bld: 118 mg/dL — ABNORMAL HIGH (ref 70–99)
Potassium: 3.2 mEq/L — ABNORMAL LOW (ref 3.5–5.1)
Sodium: 142 mEq/L (ref 135–145)
Total Bilirubin: 0.6 mg/dL (ref 0.2–1.2)
Total Protein: 6.7 g/dL (ref 6.0–8.3)

## 2016-07-26 LAB — CBC
HCT: 45.5 % (ref 39.0–52.0)
Hemoglobin: 15.7 g/dL (ref 13.0–17.0)
MCHC: 34.5 g/dL (ref 30.0–36.0)
MCV: 90.2 fl (ref 78.0–100.0)
Platelets: 288 10*3/uL (ref 150.0–400.0)
RBC: 5.04 Mil/uL (ref 4.22–5.81)
RDW: 14.9 % (ref 11.5–15.5)
WBC: 9.4 10*3/uL (ref 4.0–10.5)

## 2016-07-26 LAB — PSA: PSA: 0.99 ng/mL (ref 0.10–4.00)

## 2016-07-26 LAB — LDL CHOLESTEROL, DIRECT: Direct LDL: 121 mg/dL

## 2016-07-26 MED ORDER — AMLODIPINE BESYLATE 10 MG PO TABS: 10.0000 mg | ORAL_TABLET | Freq: Every day | ORAL | 2 refills | Status: DC

## 2016-07-26 MED ORDER — BENAZEPRIL HCL 40 MG PO TABS: 40.0000 mg | ORAL_TABLET | Freq: Every day | ORAL | 1 refills | Status: DC

## 2016-07-26 MED ORDER — CHLORTHALIDONE 25 MG PO TABS: 25.0000 mg | ORAL_TABLET | Freq: Every day | ORAL | 1 refills | Status: DC

## 2016-07-26 NOTE — Assessment & Plan Note (Addendum)
Blood pressure well-controlled and below goal 140/90 with current medication regimen and no adverse side effects. Denies worse headache of life withsymptoms of end organ damage noted on physical exam. Encouraged to follow low-sodium diet and continue monitoring blood pressure at home. Continue current dosage of amlodipine, metoprolol, chlorthalidone, and benazepril.

## 2016-07-26 NOTE — Progress Notes (Signed)
Subjective:    Patient ID: Tim Decker, male    DOB: 1955/12/24, 61 y.o.   MRN: [id number]  Chief Complaint  Patient presents with  . Hypertension    HPI:  Tim Decker  is a 61 y.o. male who  has a past medical history of Hyperlipidemia; Hypertension; and Pre-diabetes. and presents today for a follow up office visit.  Currently maintained on metoprolol, chlorthalidone, amlodipine and benazepril.and reports taking the medications as prescribed and denies adverse side effects or hypotensive readings. Blood pressures at home has been labile up and down. Denies changes in vision, worst headache of life or new symptoms of end organ damage. Working on following a low sodium diet.    BP Readings from Last 3 Encounters:  07/26/16 120/82  07/05/16 (!) 160/98  06/24/15 122/82    No Known Allergies   Outpatient Medications Prior to Visit  Medication Sig Dispense Refill  . aspirin 81 MG tablet Take 81 mg by mouth daily.      Marland Kitchen doxazosin (CARDURA) 2 MG tablet TAKE ONE TABLET BY MOUTH ONCE DAILY 90 tablet 2  . Ibuprofen-Famotidine 800-26.6 MG TABS Take 1 tablet by mouth 3 (three) times daily as needed. 9 tablet 0  . metoprolol succinate (TOPROL-XL) 25 MG 24 hr tablet Take 1 tablet (25 mg total) by mouth daily. 30 tablet 1  . Multiple Vitamin (MULTIVITAMIN) tablet Take 1 tablet by mouth daily.      . niacin 500 MG tablet Take 500 mg by mouth daily with breakfast.      . Omega-3 Fatty Acids (FISH OIL) 1000 MG CAPS Take by mouth daily.      . potassium chloride (K-DUR) 10 MEQ tablet TAKE ONE TABLET BY MOUTH ONCE DAILY 90 tablet 2  . tadalafil (CIALIS) 20 MG tablet TAKE ONE TABLET BY MOUTH EVERY 3 DAYS AS NEEDED 6 tablet 2  . vitamin E 400 UNIT capsule Take 400 Units by mouth daily.      Marland Kitchen amLODipine (NORVASC) 10 MG tablet Take 1 tablet (10 mg total) by mouth daily. 90 tablet 2  . benazepril (LOTENSIN) 40 MG tablet Take 1 tablet (40 mg total) by mouth daily. 90 tablet 1  .  chlorthalidone (HYGROTON) 25 MG tablet TAKE ONE TABLET BY MOUTH ONCE DAILY 90 tablet 1   No facility-administered medications prior to visit.     Review of Systems  Constitutional: Negative for chills and fever.  Eyes:       Negative for changes in vision  Respiratory: Negative for cough, chest tightness and wheezing.   Cardiovascular: Negative for chest pain, palpitations and leg swelling.  Neurological: Negative for dizziness, weakness and light-headedness.      Objective:    BP 120/82 (BP Location: Left Arm, Patient Position: Sitting, Cuff Size: Large)   Pulse 95   Temp 97.9 F (36.6 C) (Oral)   Resp 16   Ht 5\' 9"  (1.753 m)   Wt 227 lb (103 kg)   SpO2 96%   BMI 33.52 kg/m  Nursing note and vital signs reviewed.  Physical Exam  Constitutional: He is oriented to person, place, and time. He appears well-developed and well-nourished. No distress.  Cardiovascular: Normal rate, regular rhythm, normal heart sounds and intact distal pulses.   Pulmonary/Chest: Effort normal and breath sounds normal.  Neurological: He is alert and oriented to person, place, and time.  Skin: Skin is warm and dry.  Psychiatric: He has a normal mood and affect. His behavior is  normal. Judgment and thought content normal.       Assessment & Plan:   Problem List Items Addressed This Visit      Cardiovascular and Mediastinum   Essential hypertension - Primary    Blood pressure well-controlled and below goal 140/90 with current medication regimen and no adverse side effects. Denies worse headache of life withsymptoms of end organ damage noted on physical exam. Encouraged to follow low-sodium diet and continue monitoring blood pressure at home. Continue current dosage of amlodipine, metoprolol, chlorthalidone, and benazepril.      Relevant Medications   chlorthalidone (HYGROTON) 25 MG tablet   benazepril (LOTENSIN) 40 MG tablet   amLODipine (NORVASC) 10 MG tablet    Other Visit Diagnoses     Healthcare maintenance       Relevant Orders   CBC   Comprehensive metabolic panel   Lipid panel   PSA       I have changed Tim Decker chlorthalidone. I am also having him maintain his niacin, Fish Oil, vitamin E, aspirin, multivitamin, tadalafil, metoprolol succinate, Ibuprofen-Famotidine, doxazosin, potassium chloride, benazepril, and amLODipine.   Meds ordered this encounter  Medications  . chlorthalidone (HYGROTON) 25 MG tablet    Sig: Take 1 tablet (25 mg total) by mouth daily.    Dispense:  90 tablet    Refill:  1    Order Specific Question:   Supervising Provider    Answer:   Hillard Danker A [[id number]]  . benazepril (LOTENSIN) 40 MG tablet    Sig: Take 1 tablet (40 mg total) by mouth daily.    Dispense:  90 tablet    Refill:  1    Order Specific Question:   Supervising Provider    Answer:   Hillard Danker A [[id number]]  . amLODipine (NORVASC) 10 MG tablet    Sig: Take 1 tablet (10 mg total) by mouth daily.    Dispense:  90 tablet    Refill:  2    Order Specific Question:   Supervising Provider    Answer:   Hillard Danker A [[id number]]     Follow-up: Return in about 4 months (around 11/25/2016), or if symptoms worsen or fail to improve.  Jeanine Luz, FNP

## 2016-07-26 NOTE — Patient Instructions (Signed)
Thank you for choosing [facility].  SUMMARY AND INSTRUCTIONS:  Please continue to check your blood pressure at home at different times throughout the day.  Continue to take your medications as prescribed.  Follow low sodium diet.   Follow up for your annual physical/wellness.   Medication:  Your prescription(s) have been submitted to your pharmacy or been printed and provided for you. Please take as directed and contact our office if you believe you are having problem(s) with the medication(s) or have any questions.  Labs:  Please stop by the lab on the lower level of the building for your blood work. Your results will be released to MyChart (or called to you) after review, usually within 72 hours after test completion. If any changes need to be made, you will be notified at that same time.  1.) The lab is open from 7:30am to 5:30 pm Monday-Friday 2.) No appointment is necessary 3.) Fasting (if needed) is 6-8 hours after food and drink; black coffee and water are okay   Follow up:  If your symptoms worsen or fail to improve, please contact our office for further instruction, or in case of emergency go directly to the emergency room at the closest medical facility.

## 2016-07-27 ENCOUNTER — Encounter: Payer: Self-pay | Admitting: Family

## 2016-08-01 ENCOUNTER — Encounter: Payer: BLUE CROSS/BLUE SHIELD | Admitting: Family

## 2016-08-11 ENCOUNTER — Ambulatory Visit (INDEPENDENT_AMBULATORY_CARE_PROVIDER_SITE_OTHER): Payer: BLUE CROSS/BLUE SHIELD | Admitting: Family

## 2016-08-11 ENCOUNTER — Encounter: Payer: Self-pay | Admitting: Family

## 2016-08-11 VITALS — BP 124/82 | HR 94 | Temp 97.8°F | Resp 16 | Ht 69.0 in | Wt 233.0 lb

## 2016-08-11 DIAGNOSIS — E6609 Other obesity due to excess calories: Secondary | ICD-10-CM | POA: Diagnosis not present

## 2016-08-11 DIAGNOSIS — Z6834 Body mass index (BMI) 34.0-34.9, adult: Secondary | ICD-10-CM | POA: Diagnosis not present

## 2016-08-11 DIAGNOSIS — I10 Essential (primary) hypertension: Secondary | ICD-10-CM

## 2016-08-11 DIAGNOSIS — Z0000 Encounter for general adult medical examination without abnormal findings: Secondary | ICD-10-CM

## 2016-08-11 DIAGNOSIS — E669 Obesity, unspecified: Secondary | ICD-10-CM | POA: Insufficient documentation

## 2016-08-11 DIAGNOSIS — E782 Mixed hyperlipidemia: Secondary | ICD-10-CM | POA: Diagnosis not present

## 2016-08-11 DIAGNOSIS — R7303 Prediabetes: Secondary | ICD-10-CM | POA: Diagnosis not present

## 2016-08-11 LAB — POCT GLYCOSYLATED HEMOGLOBIN (HGB A1C): Hemoglobin A1C: 6.2

## 2016-08-11 MED ORDER — ROSUVASTATIN CALCIUM 10 MG PO TABS
10.0000 mg | ORAL_TABLET | Freq: Every day | ORAL | 1 refills | Status: DC
Start: 2016-08-11 — End: 2016-10-24

## 2016-08-11 NOTE — Assessment & Plan Note (Signed)
Elevated fasting blood sugar of 118 with an office A1c of 6.2 indicating prediabetes. Encourage nutritional lifestyle management. Recommend avoiding saturated fats and processed/sugary foods as he indicates he eats out on a regular basis. Continue to monitor.

## 2016-08-11 NOTE — Assessment & Plan Note (Signed)
Hypertension appears adequately controlled current medication regimen and no adverse side effects or hypotensive readings. Encouraged to monitor blood pressure at home and follow low-sodium diet. Continue current dosage of amlodipine, benazepril, metoprolol, and doxazosin.

## 2016-08-11 NOTE — Assessment & Plan Note (Signed)
BMI of 34.41 indicating obesity. Recommend weight loss of 5-10% of current body weight. Recommend increasing physical activity to 30 minutes of moderate level activity daily. Encourage nutritional intake that focuses on nutrient dense foods and is moderate, varied, and balanced and is low in saturated fats and processed/sugary foods. Continue to monitor.

## 2016-08-11 NOTE — Assessment & Plan Note (Signed)
1) Anticipatory Guidance: Discussed importance of wearing a seatbelt while driving and not texting while driving; changing batteries in smoke detector at least once annually; wearing suntan lotion when outside; eating a balanced and moderate diet; getting physical activity at least 30 minutes per day.  2) Immunizations / Screenings / Labs:  All immunizations are up-to-date per recommendations. Due for a vision exam encouraged to be completed independently. Hepatitis C screening is up-to-date. PSA screening completed and normal. Colon cancer screening is up-to-date per recommendations. All other screenings are up-to-date per recommendations. Blood work previously completed and reviewed with patient.  Overall well exam with risk factors for cardiovascular disease include hyperlipidemia, hypertension. Obesity, and hyperglycemia. In office  A1c shows prediabetes at 6.2. Recommend weight loss of 5-10% of current body weight through nutrition and physical activity changes. Blood pressure appears adequately controlled current medication regimen. Cholesterol appears elevated with need for medication. Continue other healthy lifestyle behaviors and choices. Follow-up prevention exam in 1 year. Follow-up office visit pending chronic conditions.

## 2016-08-11 NOTE — Progress Notes (Signed)
Subjective:    Patient ID: Tim Decker, male    DOB: February 24, 1956, 61 y.o.   MRN: [id number]  Chief Complaint  Patient presents with  . CPE    bw has been done    HPI:  Tim Decker is a 61 y.o. male who presents today for an annual wellness visit.   1) Health Maintenance -   Diet - Averaging about 2-3 meals per day consisting of a regular diet; Caffeine intake of 2-3 cups per day  Exercise - Work is physical; no structured exercise.    2) Preventative Exams / Immunizations:  Dental -- Up to date  Vision -- Due for exam   Health Maintenance  Topic Date Due  . HIV Screening  03/19/1970  . INFLUENZA VACCINE  11/15/2016  . COLONOSCOPY  02/13/2018  . TETANUS/TDAP  04/21/2020  . Hepatitis C Screening  Completed    Immunization History  Administered Date(s) Administered  . Influenza Whole 02/03/2008  . Influenza-Unspecified 02/17/2015  . Td 04/21/2010     No Known Allergies   Outpatient Medications Prior to Visit  Medication Sig Dispense Refill  . amLODipine (NORVASC) 10 MG tablet Take 1 tablet (10 mg total) by mouth daily. 90 tablet 2  . aspirin 81 MG tablet Take 81 mg by mouth daily.      . benazepril (LOTENSIN) 40 MG tablet Take 1 tablet (40 mg total) by mouth daily. 90 tablet 1  . chlorthalidone (HYGROTON) 25 MG tablet Take 1 tablet (25 mg total) by mouth daily. 90 tablet 1  . doxazosin (CARDURA) 2 MG tablet TAKE ONE TABLET BY MOUTH ONCE DAILY 90 tablet 2  . Ibuprofen-Famotidine 800-26.6 MG TABS Take 1 tablet by mouth 3 (three) times daily as needed. 9 tablet 0  . metoprolol succinate (TOPROL-XL) 25 MG 24 hr tablet Take 1 tablet (25 mg total) by mouth daily. 30 tablet 1  . Multiple Vitamin (MULTIVITAMIN) tablet Take 1 tablet by mouth daily.      . niacin 500 MG tablet Take 500 mg by mouth daily with breakfast.      . Omega-3 Fatty Acids (FISH OIL) 1000 MG CAPS Take by mouth daily.      . potassium chloride (K-DUR) 10 MEQ tablet TAKE ONE TABLET  BY MOUTH ONCE DAILY 90 tablet 2  . tadalafil (CIALIS) 20 MG tablet TAKE ONE TABLET BY MOUTH EVERY 3 DAYS AS NEEDED 6 tablet 2  . vitamin E 400 UNIT capsule Take 400 Units by mouth daily.       No facility-administered medications prior to visit.      Past Medical History:  Diagnosis Date  . Hyperlipidemia   . Hypertension   . Pre-diabetes    A1c 6%     Past Surgical History:  Procedure Laterality Date  . colonoscopy with polypectomy  2009   Dr Leaf[facility]  . TONSILLECTOMY AND ADENOIDECTOMY    . WISDOM TOOTH EXTRACTION     two extraction     Family History  Problem Relation Age of Onset  . Heart attack Father 69  . Diabetes Mother   . Heart attack Paternal Grandfather     ? 72  . Cancer Neg Hx   . Stroke Neg Hx      Social History   Social History  . Marital status: Divorced    Spouse name: N/A  . Number of children: N/A  . Years of education: N/A   Occupational History  . Not on file.  Social History Main Topics  . Smoking status: Current Every Day Smoker    Packs/day: 0.25    Years: 41.00    Types: Cigarettes    Last attempt to quit: 04/18/1991  . Smokeless tobacco: Never Used     Comment: smoked 1972-1993, up to 1/2 ppd. As of 02/24/15 5 cig/day  . Alcohol use Yes     Comment:  1 mixed drink/week  . Drug use: No  . Sexual activity: Not on file   Other Topics Concern  . Not on file   Social History Narrative  . No narrative on file      Review of Systems  Constitutional: Denies fever, chills, fatigue, or significant weight gain/loss. HENT: Head: Denies headache or neck pain Ears: Denies changes in hearing, ringing in ears, earache, drainage Nose: Denies discharge, stuffiness, itching, nosebleed, sinus pain Throat: Denies sore throat, hoarseness, dry mouth, sores, thrush Eyes: Denies loss/changes in vision, pain, redness, blurry/double vision, flashing lights Cardiovascular: Denies chest pain/discomfort, tightness, palpitations,  shortness of breath with activity, difficulty lying down, swelling, sudden awakening with shortness of breath Respiratory: Denies shortness of breath, cough, sputum production, wheezing Gastrointestinal: Denies dysphasia, heartburn, change in appetite, nausea, change in bowel habits, rectal bleeding, constipation, diarrhea, yellow skin or eyes Genitourinary: Denies frequency, urgency, burning/pain, blood in urine, incontinence, change in urinary strength. Musculoskeletal: Denies muscle/joint pain, stiffness, back pain, redness or swelling of joints, trauma Skin: Denies rashes, lumps, itching, dryness, color changes, or hair/nail changes Neurological: Denies dizziness, fainting, seizures, weakness, numbness, tingling, tremor Psychiatric - Denies nervousness, stress, depression or memory loss Endocrine: Denies heat or cold intolerance, sweating, frequent urination, excessive thirst, changes in appetite Hematologic: Denies ease of bruising or bleeding     Objective:     BP 124/82 (BP Location: Left Arm, Patient Position: Sitting, Cuff Size: Large)   Pulse 94   Temp 97.8 F (36.6 C) (Oral)   Resp 16   Ht 5\' 9"  (1.753 m)   Wt 233 lb (105.7 kg)   SpO2 95%   BMI 34.41 kg/m  Nursing note and vital signs reviewed.  Physical Exam  Constitutional: He is oriented to person, place, and time. He appears well-developed and well-nourished. No distress.  HENT:  Head: Normocephalic.  Right Ear: Hearing, tympanic membrane, external ear and ear canal normal.  Left Ear: Hearing, tympanic membrane, external ear and ear canal normal.  Nose: Nose normal.  Mouth/Throat: Uvula is midline, oropharynx is clear and moist and mucous membranes are normal.  Eyes: Conjunctivae and EOM are normal. Pupils are equal, round, and reactive to light.  Neck: Neck supple. No JVD present. No tracheal deviation present. No thyromegaly present.  Cardiovascular: Normal rate, regular rhythm, normal heart sounds and intact  distal pulses.   Pulmonary/Chest: Effort normal and breath sounds normal.  Abdominal: Soft. Bowel sounds are normal. He exhibits no distension and no mass. There is no tenderness. There is no rebound and no guarding.  Musculoskeletal: Normal range of motion. He exhibits no edema or tenderness.  Lymphadenopathy:    He has no cervical adenopathy.  Neurological: He is alert and oriented to person, place, and time. He has normal reflexes. No cranial nerve deficit. He exhibits normal muscle tone. Coordination normal.  Skin: Skin is warm and dry.  Psychiatric: He has a normal mood and affect. His behavior is normal. Judgment and thought content normal.       Assessment & Plan:   Problem List Items Addressed This Visit  Cardiovascular and Mediastinum   Essential hypertension    Hypertension appears adequately controlled current medication regimen and no adverse side effects or hypotensive readings. Encouraged to monitor blood pressure at home and follow low-sodium diet. Continue current dosage of amlodipine, benazepril, metoprolol, and doxazosin.      Relevant Medications   rosuvastatin (CRESTOR) 10 MG tablet     Other   Mixed hyperlipidemia    Hyperlipidemia remains labile with current medication regimen of niacin and improved triglyceride level. Patient remains at significant risk for cardiovascular disease. Start Crestor. Continue current dosage and niacin. Encouraged decrease foods high in cholesterol and saturated fats. Continue to monitor.      Relevant Medications   rosuvastatin (CRESTOR) 10 MG tablet   Prediabetes    Elevated fasting blood sugar of 118 with an office A1c of 6.2 indicating prediabetes. Encourage nutritional lifestyle management. Recommend avoiding saturated fats and processed/sugary foods as he indicates he eats out on a regular basis. Continue to monitor.      Relevant Orders   POCT HgB A1C (Completed)   Routine general medical examination at a health care  facility - Primary    1) Anticipatory Guidance: Discussed importance of wearing a seatbelt while driving and not texting while driving; changing batteries in smoke detector at least once annually; wearing suntan lotion when outside; eating a balanced and moderate diet; getting physical activity at least 30 minutes per day.  2) Immunizations / Screenings / Labs:  All immunizations are up-to-date per recommendations. Due for a vision exam encouraged to be completed independently. Hepatitis C screening is up-to-date. PSA screening completed and normal. Colon cancer screening is up-to-date per recommendations. All other screenings are up-to-date per recommendations. Blood work previously completed and reviewed with patient.  Overall well exam with risk factors for cardiovascular disease include hyperlipidemia, hypertension. Obesity, and hyperglycemia. In office  A1c shows prediabetes at 6.2. Recommend weight loss of 5-10% of current body weight through nutrition and physical activity changes. Blood pressure appears adequately controlled current medication regimen. Cholesterol appears elevated with need for medication. Continue other healthy lifestyle behaviors and choices. Follow-up prevention exam in 1 year. Follow-up office visit pending chronic conditions.       Obesity    BMI of 34.41 indicating obesity. Recommend weight loss of 5-10% of current body weight. Recommend increasing physical activity to 30 minutes of moderate level activity daily. Encourage nutritional intake that focuses on nutrient dense foods and is moderate, varied, and balanced and is low in saturated fats and processed/sugary foods. Continue to monitor.            I am having Mr. Tomich start on rosuvastatin. I am also having him maintain his niacin, Fish Oil, vitamin E, aspirin, multivitamin, tadalafil, metoprolol succinate, Ibuprofen-Famotidine, doxazosin, potassium chloride, chlorthalidone, benazepril, and  amLODipine.   Meds ordered this encounter  Medications  . rosuvastatin (CRESTOR) 10 MG tablet    Sig: Take 1 tablet (10 mg total) by mouth daily.    Dispense:  30 tablet    Refill:  1    Order Specific Question:   Supervising Provider    Answer:   Hillard Danker A [[id number]]     Follow-up: Return in about 6 months (around 02/10/2017), or if symptoms worsen or fail to improve.   Jeanine Luz, FNP

## 2016-08-11 NOTE — Assessment & Plan Note (Signed)
Hyperlipidemia remains labile with current medication regimen of niacin and improved triglyceride level. Patient remains at significant risk for cardiovascular disease. Start Crestor. Continue current dosage and niacin. Encouraged decrease foods high in cholesterol and saturated fats. Continue to monitor.

## 2016-08-11 NOTE — Patient Instructions (Addendum)
Thank you for choosing [facility].  SUMMARY AND INSTRUCTIONS:  Please continue to take your medications as prescribed.   Start the crestor which is at your pharmacy.   Work on tobacco cessation.  Watch saturated fats and processed / sugary foods.   We should consider testing for sleep apnea.   Medication:  Your prescription(s) have been submitted to your pharmacy or been printed and provided for you. Please take as directed and contact our office if you believe you are having problem(s) with the medication(s) or have any questions.  Follow up:  If your symptoms worsen or fail to improve, please contact our office for further instruction, or in case of emergency go directly to the emergency room at the closest medical facility.    Health Maintenance, Male A healthy lifestyle and preventive care is important for your health and wellness. Ask your health care provider about what schedule of regular examinations is right for you. What should I know about weight and diet?  Eat a Healthy Diet  Eat plenty of vegetables, fruits, whole grains, low-fat dairy products, and lean protein.  Do not eat a lot of foods high in solid fats, added sugars, or salt. Maintain a Healthy Weight  Regular exercise can help you achieve or maintain a healthy weight. You should:  Do at least 150 minutes of exercise each week. The exercise should increase your heart rate and make you sweat (moderate-intensity exercise).  Do strength-training exercises at least twice a week. Watch Your Levels of Cholesterol and Blood Lipids  Have your blood tested for lipids and cholesterol every 5 years starting at 62 years of age. If you are at high risk for heart disease, you should start having your blood tested when you are 62 years old. You may need to have your cholesterol levels checked more often if:  Your lipid or cholesterol levels are high.  You are older than 62 years of age.  You are at high risk  for heart disease. What should I know about cancer screening? Many types of cancers can be detected early and may often be prevented. Lung Cancer  You should be screened every year for lung cancer if:  You are a current smoker who has smoked for at least 30 years.  You are a former smoker who has quit within the past 15 years.  Talk to your health care provider about your screening options, when you should start screening, and how often you should be screened. Colorectal Cancer  Routine colorectal cancer screening usually begins at 62 years of age and should be repeated every 5-10 years until you are 62 years old. You may need to be screened more often if early forms of precancerous polyps or small growths are found. Your health care provider may recommend screening at an earlier age if you have risk factors for colon cancer.  Your health care provider may recommend using home test kits to check for hidden blood in the stool.  A small camera at the end of a tube can be used to examine your colon (sigmoidoscopy or colonoscopy). This checks for the earliest forms of colorectal cancer. Prostate and Testicular Cancer  Depending on your age and overall health, your health care provider may do certain tests to screen for prostate and testicular cancer.  Talk to your health care provider about any symptoms or concerns you have about testicular or prostate cancer. Skin Cancer  Check your skin from head to toe regularly.  Tell your  health care provider about any new moles or changes in moles, especially if:  There is a change in a mole's size, shape, or color.  You have a mole that is larger than a pencil eraser.  Always use sunscreen. Apply sunscreen liberally and repeat throughout the day.  Protect yourself by wearing long sleeves, pants, a wide-brimmed hat, and sunglasses when outside. What should I know about heart disease, diabetes, and high blood pressure?  If you are 18-39 years  of age, have your blood pressure checked every 3-5 years. If you are 28 years of age or older, have your blood pressure checked every year. You should have your blood pressure measured twice-once when you are at a hospital or clinic, and once when you are not at a hospital or clinic. Record the average of the two measurements. To check your blood pressure when you are not at a hospital or clinic, you can use:  An automated blood pressure machine at a pharmacy.  A home blood pressure monitor.  Talk to your health care provider about your target blood pressure.  If you are between 72-52 years old, ask your health care provider if you should take aspirin to prevent heart disease.  Have regular diabetes screenings by checking your fasting blood sugar level.  If you are at a normal weight and have a low risk for diabetes, have this test once every three years after the age of 64.  If you are overweight and have a high risk for diabetes, consider being tested at a younger age or more often.  A one-time screening for abdominal aortic aneurysm (AAA) by ultrasound is recommended for men aged 65-75 years who are current or former smokers. What should I know about preventing infection? Hepatitis B  If you have a higher risk for hepatitis B, you should be screened for this virus. Talk with your health care provider to find out if you are at risk for hepatitis B infection. Hepatitis C  Blood testing is recommended for:  Everyone born from [birth year] through 1966.  Anyone with known risk factors for hepatitis C. Sexually Transmitted Diseases (STDs)  You should be screened each year for STDs including gonorrhea and chlamydia if:  You are sexually active and are younger than 62 years of age.  You are older than 62 years of age and your health care provider tells you that you are at risk for this type of infection.  Your sexual activity has changed since you were last screened and you are at an increased  risk for chlamydia or gonorrhea. Ask your health care provider if you are at risk.  Talk with your health care provider about whether you are at high risk of being infected with HIV. Your health care provider may recommend a prescription medicine to help prevent HIV infection. What else can I do?  Schedule regular health, dental, and eye exams.  Stay current with your vaccines (immunizations).  Do not use any tobacco products, such as cigarettes, chewing tobacco, and e-cigarettes. If you need help quitting, ask your health care provider.  Limit alcohol intake to no more than 2 drinks per day. One drink equals 12 ounces of beer, 5 ounces of wine, or 1 ounces of hard liquor.  Do not use street drugs.  Do not share needles.  Ask your health care provider for help if you need support or information about quitting drugs.  Tell your health care provider if you often feel depressed.  Tell your  health care provider if you have ever been abused or do not feel safe at home. This information is not intended to replace advice given to you by your health care provider. Make sure you discuss any questions you have with your health care provider. Document Released: 09/30/2007 Document Revised: 12/01/2015 Document Reviewed: 01/05/2015 Elsevier Interactive Patient Education  2017 [employer].

## 2016-09-13 ENCOUNTER — Other Ambulatory Visit: Payer: Self-pay | Admitting: Family

## 2016-09-13 DIAGNOSIS — I10 Essential (primary) hypertension: Secondary | ICD-10-CM

## 2016-10-24 ENCOUNTER — Other Ambulatory Visit: Payer: Self-pay | Admitting: Family

## 2017-02-12 ENCOUNTER — Other Ambulatory Visit: Payer: Self-pay

## 2017-02-12 MED ORDER — ROSUVASTATIN CALCIUM 10 MG PO TABS: 10.0000 mg | ORAL_TABLET | Freq: Every day | ORAL | 0 refills | Status: DC

## 2017-04-14 ENCOUNTER — Other Ambulatory Visit: Payer: Self-pay | Admitting: Family

## 2017-04-14 DIAGNOSIS — I10 Essential (primary) hypertension: Secondary | ICD-10-CM

## 2017-04-24 ENCOUNTER — Other Ambulatory Visit: Payer: Self-pay | Admitting: Nurse Practitioner

## 2017-04-24 ENCOUNTER — Other Ambulatory Visit: Payer: Self-pay | Admitting: Family

## 2017-04-26 ENCOUNTER — Telehealth: Payer: Self-pay | Admitting: Family

## 2017-04-26 NOTE — Telephone Encounter (Signed)
I will see him 

## 2017-04-26 NOTE — Telephone Encounter (Signed)
Copied from CRM [id number]. Topic: Appointment Scheduling - Prior Auth Required for Appointment >> Apr 26, 2017  9:17 AM Tim Decker, Tim Decker wrote: No appointment has been scheduled. Patient is requesting transfer fron [facility] to wither Dr. Yetta Barre or Dr. Posey Rea appointment. Per scheduling protocol, this appointment requires a prior authorization prior to scheduling.  Route to department's PEC pool. Please call patient back, thanks.

## 2017-04-30 ENCOUNTER — Other Ambulatory Visit: Payer: Self-pay | Admitting: Family

## 2017-04-30 DIAGNOSIS — I10 Essential (primary) hypertension: Secondary | ICD-10-CM

## 2017-05-01 ENCOUNTER — Other Ambulatory Visit: Payer: Self-pay | Admitting: *Deleted

## 2017-05-01 DIAGNOSIS — I10 Essential (primary) hypertension: Secondary | ICD-10-CM

## 2017-05-01 MED ORDER — CHLORTHALIDONE 25 MG PO TABS: 25.0000 mg | ORAL_TABLET | Freq: Every day | ORAL | 0 refills | Status: DC

## 2017-05-01 MED ORDER — METOPROLOL SUCCINATE ER 25 MG PO TB24: 25.0000 mg | ORAL_TABLET | Freq: Every day | ORAL | 0 refills | Status: DC

## 2017-05-10 ENCOUNTER — Encounter: Payer: Self-pay | Admitting: Internal Medicine

## 2017-05-10 ENCOUNTER — Other Ambulatory Visit (INDEPENDENT_AMBULATORY_CARE_PROVIDER_SITE_OTHER): Payer: BLUE CROSS/BLUE SHIELD

## 2017-05-10 ENCOUNTER — Ambulatory Visit: Payer: BLUE CROSS/BLUE SHIELD | Admitting: Internal Medicine

## 2017-05-10 VITALS — BP 158/86 | HR 93 | Temp 98.8°F | Resp 16 | Ht 69.0 in | Wt 236.1 lb

## 2017-05-10 DIAGNOSIS — R7303 Prediabetes: Secondary | ICD-10-CM | POA: Diagnosis not present

## 2017-05-10 DIAGNOSIS — E781 Pure hyperglyceridemia: Secondary | ICD-10-CM | POA: Diagnosis not present

## 2017-05-10 DIAGNOSIS — R809 Proteinuria, unspecified: Secondary | ICD-10-CM | POA: Insufficient documentation

## 2017-05-10 DIAGNOSIS — I10 Essential (primary) hypertension: Secondary | ICD-10-CM | POA: Diagnosis not present

## 2017-05-10 DIAGNOSIS — R0989 Other specified symptoms and signs involving the circulatory and respiratory systems: Secondary | ICD-10-CM

## 2017-05-10 DIAGNOSIS — E1121 Type 2 diabetes mellitus with diabetic nephropathy: Secondary | ICD-10-CM | POA: Diagnosis not present

## 2017-05-10 DIAGNOSIS — Z23 Encounter for immunization: Secondary | ICD-10-CM

## 2017-05-10 DIAGNOSIS — E876 Hypokalemia: Secondary | ICD-10-CM | POA: Diagnosis not present

## 2017-05-10 DIAGNOSIS — E785 Hyperlipidemia, unspecified: Secondary | ICD-10-CM

## 2017-05-10 LAB — CBC WITH DIFFERENTIAL/PLATELET
Basophils Absolute: 0.1 10*3/uL (ref 0.0–0.1)
Basophils Relative: 0.8 % (ref 0.0–3.0)
Eosinophils Absolute: 0.3 10*3/uL (ref 0.0–0.7)
Eosinophils Relative: 4.1 % (ref 0.0–5.0)
HCT: 45.3 % (ref 39.0–52.0)
Hemoglobin: 15.5 g/dL (ref 13.0–17.0)
Lymphocytes Relative: 26.2 % (ref 12.0–46.0)
Lymphs Abs: 1.9 10*3/uL (ref 0.7–4.0)
MCHC: 34.2 g/dL (ref 30.0–36.0)
MCV: 89.3 fl (ref 78.0–100.0)
Monocytes Absolute: 0.8 10*3/uL (ref 0.1–1.0)
Monocytes Relative: 10.2 % (ref 3.0–12.0)
Neutro Abs: 4.3 10*3/uL (ref 1.4–7.7)
Neutrophils Relative %: 58.7 % (ref 43.0–77.0)
Platelets: 277 10*3/uL (ref 150.0–400.0)
RBC: 5.08 Mil/uL (ref 4.22–5.81)
RDW: 15.2 % (ref 11.5–15.5)
WBC: 7.4 10*3/uL (ref 4.0–10.5)

## 2017-05-10 LAB — COMPREHENSIVE METABOLIC PANEL
ALT: 33 U/L (ref 0–53)
AST: 20 U/L (ref 0–37)
Albumin: 4.2 g/dL (ref 3.5–5.2)
Alkaline Phosphatase: 51 U/L (ref 39–117)
BUN: 11 mg/dL (ref 6–23)
CO2: 32 mEq/L (ref 19–32)
Calcium: 9.3 mg/dL (ref 8.4–10.5)
Chloride: 98 mEq/L (ref 96–112)
Creatinine, Ser: 1.18 mg/dL (ref 0.40–1.50)
GFR: 66.45 mL/min (ref >60.00)
Glucose, Bld: 156 mg/dL — ABNORMAL HIGH (ref 70–99)
Potassium: 2.9 mEq/L — ABNORMAL LOW (ref 3.5–5.1)
Sodium: 139 mEq/L (ref 135–145)
Total Bilirubin: 0.4 mg/dL (ref 0.2–1.2)
Total Protein: 7 g/dL (ref 6.0–8.3)

## 2017-05-10 LAB — MICROALBUMIN / CREATININE URINE RATIO
Creatinine,U: 135.1 mg/dL
Microalb Creat Ratio: 65.7 mg/g — ABNORMAL HIGH (ref 0.0–30.0)
Microalb, Ur: 88.8 mg/dL — ABNORMAL HIGH (ref 0.0–1.9)

## 2017-05-10 LAB — URINALYSIS, ROUTINE W REFLEX MICROSCOPIC
Bilirubin Urine: NEGATIVE
Ketones, ur: NEGATIVE
Leukocytes, UA: NEGATIVE
Nitrite: NEGATIVE
Specific Gravity, Urine: >=1.03 — AB (ref 1.000–1.030)
Total Protein, Urine: 100 — AB
Urine Glucose: 100 — AB
Urobilinogen, UA: 0.2 (ref 0.0–1.0)
pH: 6 (ref 5.0–8.0)

## 2017-05-10 LAB — LIPID PANEL
Cholesterol: 146 mg/dL (ref 0–200)
HDL: 30.2 mg/dL — ABNORMAL LOW (ref >39.00)
NonHDL: 115.91
Total CHOL/HDL Ratio: 5
Triglycerides: 394 mg/dL — ABNORMAL HIGH (ref 0.0–149.0)
VLDL: 78.8 mg/dL — ABNORMAL HIGH (ref 0.0–40.0)

## 2017-05-10 LAB — HEMOGLOBIN A1C: Hgb A1c MFr Bld: 7.4 % — ABNORMAL HIGH (ref 4.6–6.5)

## 2017-05-10 LAB — LDL CHOLESTEROL, DIRECT: Direct LDL: 69 mg/dL

## 2017-05-10 MED ORDER — EMPAGLIFLOZIN-METFORMIN HCL 5-1000 MG PO TABS: 1.0000 | ORAL_TABLET | Freq: Two times a day (BID) | ORAL | 1 refills | Status: DC

## 2017-05-10 MED ORDER — METOPROLOL SUCCINATE ER 25 MG PO TB24: 25.0000 mg | ORAL_TABLET | Freq: Every day | ORAL | 1 refills | Status: DC

## 2017-05-10 MED ORDER — IRBESARTAN 300 MG PO TABS
300.0000 mg | ORAL_TABLET | Freq: Every day | ORAL | 1 refills | Status: DC
Start: 2017-05-10 — End: 2017-12-19

## 2017-05-10 MED ORDER — SPIRONOLACTONE 25 MG PO TABS: 25.0000 mg | ORAL_TABLET | Freq: Every day | ORAL | 0 refills | Status: DC

## 2017-05-10 MED ORDER — OMEGA-3-ACID ETHYL ESTERS 1 G PO CAPS
2.0000 g | ORAL_CAPSULE | Freq: Two times a day (BID) | ORAL | 1 refills | Status: DC
Start: 2017-05-10 — End: 2018-04-25

## 2017-05-10 NOTE — Progress Notes (Signed)
Subjective:  Patient ID: Tim Decker, male    DOB: 11-15-1955  Age: 62 y.o. MRN: [id number]  CC: Hypertension and Hyperlipidemia  NEW TO ME  HPI Tim Decker presents for establishing as a new patient.  He is concerned that his sister recently had a CVA due to carotid atherosclerosis.  His father had an MI at age 65 as well.  He tells me that he was previously evaluated by a cardiologist and passed a treadmill test.  He is very active and denies any recent episodes of CP, DOE, shortness of breath, edema, or fatigue.  He does complain of weight gain.  History Tim Decker has a past medical history of Hyperlipidemia, Hypertension, and Pre-diabetes.   He has a past surgical history that includes Tonsillectomy and adenoidectomy; Wisdom tooth extraction; and colonoscopy with polypectomy (2009).   His family history includes Diabetes in his mother; Heart attack in his paternal grandfather; Heart attack (age of onset: 86) in his father.He reports that he has been smoking cigarettes.  He has a 10.25 pack-year smoking history. he has never used smokeless tobacco. He reports that he drinks alcohol. He reports that he does not use drugs.  Outpatient Medications Prior to Visit  Medication Sig Dispense Refill  . amLODipine (NORVASC) 10 MG tablet Take 1 tablet (10 mg total) by mouth daily. 90 tablet 2  . aspirin 81 MG tablet Take 81 mg by mouth daily.      Marland Kitchen doxazosin (CARDURA) 2 MG tablet TAKE ONE TABLET BY MOUTH ONCE DAILY 90 tablet 2  . niacin 500 MG tablet Take 500 mg by mouth daily with breakfast.      . Omega-3 Fatty Acids (FISH OIL) 1000 MG CAPS Take by mouth daily.      . potassium chloride (K-DUR) 10 MEQ tablet TAKE ONE TABLET BY MOUTH ONCE DAILY 90 tablet 2  . rosuvastatin (CRESTOR) 10 MG tablet Take 1 tablet (10 mg total) by mouth daily. Needs to establish with new PCP for more refills. 90 tablet 0  . tadalafil (CIALIS) 20 MG tablet TAKE ONE TABLET BY MOUTH EVERY 3 DAYS AS NEEDED 6  tablet 2  . vitamin E 400 UNIT capsule Take 400 Units by mouth daily.      . benazepril (LOTENSIN) 40 MG tablet Take 1 tablet (40 mg total) by mouth daily. 90 tablet 1  . chlorthalidone (HYGROTON) 25 MG tablet Take 1 tablet (25 mg total) by mouth daily. Must keep appt w/new provider for future refill 30 tablet 0  . Ibuprofen-Famotidine 800-26.6 MG TABS Take 1 tablet by mouth 3 (three) times daily as needed. 9 tablet 0  . metoprolol succinate (TOPROL-XL) 25 MG 24 hr tablet Take 1 tablet (25 mg total) by mouth daily. Must keep appt w/new provider for future refill 30 tablet 0  . Multiple Vitamin (MULTIVITAMIN) tablet Take 1 tablet by mouth daily.       No facility-administered medications prior to visit.     ROS Review of Systems  Constitutional: Positive for unexpected weight change. Negative for chills, diaphoresis and fatigue.  HENT: Negative.   Eyes: Negative for visual disturbance.  Respiratory: Negative for apnea, cough, chest tightness, shortness of breath and wheezing.   Cardiovascular: Negative for chest pain, palpitations and leg swelling.  Gastrointestinal: Negative for abdominal pain, constipation, diarrhea, nausea and vomiting.  Endocrine: Negative for polydipsia, polyphagia and polyuria.  Genitourinary: Negative.  Negative for decreased urine volume and difficulty urinating.  Musculoskeletal: Negative.  Negative for arthralgias,  myalgias and neck stiffness.  Skin: Negative.   Allergic/Immunologic: Negative.   Neurological: Negative.  Negative for dizziness, weakness and numbness.  Hematological: Negative for adenopathy. Does not bruise/bleed easily.  Psychiatric/Behavioral: Negative.     Objective:  BP (!) 158/86 (BP Location: Left Arm, Patient Position: Sitting, Cuff Size: Large)   Pulse 93   Temp 98.8 F (37.1 C) (Oral)   Resp 16   Ht 5\' 9"  (1.753 m)   Wt 236 lb 1.3 oz (107.1 kg)   SpO2 94%   BMI 34.86 kg/m   Physical Exam  Constitutional: He is oriented to  person, place, and time. No distress.  HENT:  Mouth/Throat: Oropharynx is clear and moist. No oropharyngeal exudate.  Eyes: Conjunctivae are normal. Left eye exhibits no discharge. No scleral icterus.  Neck: Normal range of motion. Neck supple. No JVD present. Carotid bruit is present. No thyroid mass and no thyromegaly present.  Cardiovascular: Normal rate, regular rhythm, normal heart sounds and intact distal pulses. Exam reveals no gallop.  No murmur heard. Pulses:      Carotid pulses are 1+ on the right side, and 1+ on the left side.      Radial pulses are 1+ on the right side, and 1+ on the left side.       Femoral pulses are 1+ on the right side, and 1+ on the left side.      Popliteal pulses are 1+ on the right side, and 1+ on the left side.       Dorsalis pedis pulses are 1+ on the right side, and 1+ on the left side.       Posterior tibial pulses are 1+ on the right side, and 1+ on the left side.  Pulmonary/Chest: Effort normal and breath sounds normal. No respiratory distress. He has no wheezes. He has no rales.  Abdominal: Soft. Bowel sounds are normal. He exhibits no distension and no mass. There is no tenderness.  Musculoskeletal: Normal range of motion. He exhibits no edema, tenderness or deformity.  Lymphadenopathy:    He has no cervical adenopathy.  Neurological: He is alert and oriented to person, place, and time.  Skin: Skin is warm and dry. No rash noted. He is not diaphoretic. No erythema. No pallor.  Vitals reviewed.   Lab Results  Component Value Date   WBC 7.4 05/10/2017   HGB 15.5 05/10/2017   HCT 45.3 05/10/2017   PLT 277.0 05/10/2017   GLUCOSE 156 (H) 05/10/2017   CHOL 146 05/10/2017   TRIG 394.0 (H) 05/10/2017   HDL 30.20 (L) 05/10/2017   LDLDIRECT 69.0 05/10/2017   LDLCALC 93 01/15/2014   ALT 33 05/10/2017   AST 20 05/10/2017   NA 139 05/10/2017   K 2.9 (L) 05/10/2017   CL 98 05/10/2017   CREATININE 1.18 05/10/2017   BUN 11 05/10/2017   CO2 32  05/10/2017   TSH 1.80 07/28/2015   PSA 0.99 07/26/2016   HGBA1C 7.4 (H) 05/10/2017   MICROALBUR 88.8 (H) 05/10/2017    Assessment & Plan:   Tim Decker was seen today for hypertension and hyperlipidemia.  Diagnoses and all orders for this visit:  Need for influenza vaccination -     Flu Vaccine QUAD 36+ mos IM  Essential hypertension- His blood pressure is not adequately well controlled and he has a significant degree of hypokalemia.  I have asked him to stop taking chlorthalidone and will change to spironolactone which will stabilize his potassium level.  Will also discontinue  the ACE inhibitor and upgrade to a more potent antihypertensive in the form of an ARB. -     Comprehensive metabolic panel; Future -     CBC with Differential/Platelet; Future -     irbesartan (AVAPRO) 300 MG tablet; Take 1 tablet (300 mg total) by mouth daily. -     metoprolol succinate (TOPROL-XL) 25 MG 24 hr tablet; Take 1 tablet (25 mg total) by mouth daily. -     spironolactone (ALDACTONE) 25 MG tablet; Take 1 tablet (25 mg total) by mouth daily.  Hypokalemia- Will change chlorthalidone to spironolactone to stabilize this. -     Comprehensive metabolic panel; Future -     spironolactone (ALDACTONE) 25 MG tablet; Take 1 tablet (25 mg total) by mouth daily.  Prediabetes -     Comprehensive metabolic panel; Future -     Hemoglobin A1c; Future  Pure hyperglyceridemia- I have asked him to start taking an omega-3 fish oil to lower his triglycerides and reduce complications such as pancreatitis. -     Lipid panel; Future -     omega-3 acid ethyl esters (LOVAZA) 1 g capsule; Take 2 capsules (2 g total) by mouth 2 (two) times daily.  Hyperlipidemia LDL goal <130- He has achieved his LDL goal is doing well on the statin. -     Lipid panel; Future  Microalbuminuria - Will work to mitigate this by starting an SGLT-2 inhibitor, and ARB, and gaining better control of his blood pressure and blood sugar. -      Microalbumin / creatinine urine ratio; Future -     Urinalysis, Routine w reflex microscopic; Future  Bilateral carotid bruits- Will screen for atherosclerosis with an ultrasound. -     VAS US CAROTID; Future  Diabetes mellitus with proteinuric diabetic nephropathy (HCC)- His A1c is up to 7.4%.  Will start treating the diabetes with an SGLT-2 inhibitor and metformin. -     Empagliflozin-Metformin HCl (SYNJARDY) 08-998 MG TABS; Take 1 tablet by mouth 2 (two) times daily.   I have discontinued Dmari T. Bromwell's multivitamin, Ibuprofen-Famotidine, benazepril, and chlorthalidone. I have also changed his metoprolol succinate. Additionally, I am having him start on irbesartan, spironolactone, omega-3 acid ethyl esters, and Empagliflozin-Metformin HCl. Lastly, I am having him maintain his niacin, Fish Oil, vitamin E, aspirin, tadalafil, doxazosin, potassium chloride, amLODipine, and rosuvastatin.  Meds ordered this encounter  Medications  . irbesartan (AVAPRO) 300 MG tablet    Sig: Take 1 tablet (300 mg total) by mouth daily.    Dispense:  90 tablet    Refill:  1  . metoprolol succinate (TOPROL-XL) 25 MG 24 hr tablet    Sig: Take 1 tablet (25 mg total) by mouth daily.    Dispense:  90 tablet    Refill:  1    Please consider 90 day supplies to promote better adherence  . spironolactone (ALDACTONE) 25 MG tablet    Sig: Take 1 tablet (25 mg total) by mouth daily.    Dispense:  90 tablet    Refill:  0  . omega-3 acid ethyl esters (LOVAZA) 1 g capsule    Sig: Take 2 capsules (2 g total) by mouth 2 (two) times daily.    Dispense:  360 capsule    Refill:  1  . Empagliflozin-Metformin HCl (SYNJARDY) 08-998 MG TABS    Sig: Take 1 tablet by mouth 2 (two) times daily.    Dispense:  180 tablet    Refill:  1  Follow-up: Return in about 3 months (around 08/08/2017).  Sanda Linger, MD

## 2017-05-10 NOTE — Patient Instructions (Signed)

## 2017-05-11 ENCOUNTER — Telehealth: Payer: Self-pay | Admitting: Internal Medicine

## 2017-05-11 ENCOUNTER — Other Ambulatory Visit: Payer: Self-pay | Admitting: Family

## 2017-05-11 NOTE — Telephone Encounter (Signed)
Copied from CRM [id number]. Topic: Quick Communication - See Telephone Encounter >> May 11, 2017  1:28 PM Eston Mould B wrote: CRM for notification. See Telephone encounter for:  Diane from [facility] pharmacy called for clarification on rx    spironolactone (ALDACTONE) 25 MG tablet.  Her contact number is [phone]    05/11/17.

## 2017-05-11 NOTE — Telephone Encounter (Signed)
Called diane with [facility] and informed the chlorthalidone was discontinued and to dispense the potassium.

## 2017-05-12 ENCOUNTER — Encounter: Payer: Self-pay | Admitting: Internal Medicine

## 2017-05-15 ENCOUNTER — Encounter: Payer: Self-pay | Admitting: Internal Medicine

## 2017-05-16 ENCOUNTER — Other Ambulatory Visit: Payer: Self-pay | Admitting: Internal Medicine

## 2017-05-16 ENCOUNTER — Ambulatory Visit (HOSPITAL_COMMUNITY)
Admission: RE | Admit: 2017-05-16 | Discharge: 2017-05-16 | Disposition: A | Discharge status: Home / Self Care | Payer: BLUE CROSS/BLUE SHIELD | Source: Ambulatory Visit | Attending: Internal Medicine | Admitting: Internal Medicine

## 2017-05-16 ENCOUNTER — Encounter: Payer: Self-pay | Admitting: Internal Medicine

## 2017-05-16 DIAGNOSIS — I10 Essential (primary) hypertension: Secondary | ICD-10-CM | POA: Insufficient documentation

## 2017-05-16 DIAGNOSIS — E785 Hyperlipidemia, unspecified: Secondary | ICD-10-CM | POA: Diagnosis not present

## 2017-05-16 DIAGNOSIS — I6523 Occlusion and stenosis of bilateral carotid arteries: Secondary | ICD-10-CM | POA: Insufficient documentation

## 2017-05-16 DIAGNOSIS — F17200 Nicotine dependence, unspecified, uncomplicated: Secondary | ICD-10-CM | POA: Diagnosis not present

## 2017-05-16 DIAGNOSIS — R0989 Other specified symptoms and signs involving the circulatory and respiratory systems: Secondary | ICD-10-CM

## 2017-05-16 LAB — VAS US CAROTID
LEFT ECA DIAS: -20 cm/s
LEFT VERTEBRAL DIAS: 18 cm/s
Left CCA dist dias: 24 cm/s
Left CCA dist sys: 59 cm/s
Left CCA prox dias: 26 cm/s
Left CCA prox sys: 105 cm/s
Left ICA dist dias: -15 cm/s
Left ICA dist sys: -32 cm/s
Left ICA prox dias: -25 cm/s
Left ICA prox sys: -55 cm/s
RIGHT CCA MID DIAS: 20 cm/s
RIGHT ECA DIAS: -30 cm/s
RIGHT VERTEBRAL DIAS: 8 cm/s
Right CCA prox dias: -22 cm/s
Right CCA prox sys: -90 cm/s
Right cca dist sys: -48 cm/s

## 2017-05-18 ENCOUNTER — Other Ambulatory Visit: Payer: Self-pay | Admitting: Family

## 2017-05-28 ENCOUNTER — Other Ambulatory Visit: Payer: Self-pay | Admitting: Family

## 2017-06-04 NOTE — Telephone Encounter (Signed)
Reviewed chart and I did not see a response from the patient.  Called pt and he stated that he was able to get his rx for free. He will let us know if that changes when he fills it again. Rq pt to find out what the covered alternatives are pending copay card.

## 2017-07-23 ENCOUNTER — Other Ambulatory Visit: Payer: Self-pay | Admitting: Family

## 2017-07-27 ENCOUNTER — Other Ambulatory Visit: Payer: Self-pay | Admitting: Internal Medicine

## 2017-07-27 DIAGNOSIS — I10 Essential (primary) hypertension: Secondary | ICD-10-CM

## 2017-07-27 DIAGNOSIS — E876 Hypokalemia: Secondary | ICD-10-CM

## 2017-07-27 NOTE — Telephone Encounter (Signed)
Pt needs an appt before we can fill. Can contact and schedule please?

## 2017-07-31 NOTE — Telephone Encounter (Signed)
LVM for patient to call back to make appt  °

## 2017-08-01 ENCOUNTER — Encounter: Payer: Self-pay | Admitting: Internal Medicine

## 2017-08-01 MED ORDER — AMLODIPINE BESYLATE 10 MG PO TABS: 10.0000 mg | ORAL_TABLET | Freq: Every day | ORAL | 0 refills | Status: DC

## 2017-08-01 MED ORDER — DOXAZOSIN MESYLATE 2 MG PO TABS: 2.0000 mg | ORAL_TABLET | Freq: Every day | ORAL | 0 refills | Status: DC

## 2017-08-01 MED ORDER — POTASSIUM CHLORIDE ER 10 MEQ PO TBCR
10.0000 meq | EXTENDED_RELEASE_TABLET | Freq: Every day | ORAL | 0 refills | Status: DC
Start: 2017-08-01 — End: 2018-02-18

## 2017-08-01 NOTE — Addendum Note (Signed)
Addended by: Radford Pax M on: 08/01/2017 10:43 AM   Modules accepted: Orders

## 2017-08-01 NOTE — Telephone Encounter (Signed)
Appt made. Pt states he also needs  CARDURA AMLODIPINE  POTASSIUM.

## 2017-08-01 NOTE — Telephone Encounter (Signed)
erx has been sent.  

## 2017-08-16 ENCOUNTER — Other Ambulatory Visit (INDEPENDENT_AMBULATORY_CARE_PROVIDER_SITE_OTHER): Payer: BLUE CROSS/BLUE SHIELD

## 2017-08-16 ENCOUNTER — Encounter: Payer: Self-pay | Admitting: Internal Medicine

## 2017-08-16 ENCOUNTER — Ambulatory Visit: Payer: BLUE CROSS/BLUE SHIELD | Admitting: Internal Medicine

## 2017-08-16 VITALS — BP 130/80 | HR 84 | Temp 97.9°F | Ht 69.0 in | Wt 227.0 lb

## 2017-08-16 DIAGNOSIS — M8949 Other hypertrophic osteoarthropathy, multiple sites: Secondary | ICD-10-CM

## 2017-08-16 DIAGNOSIS — E1121 Type 2 diabetes mellitus with diabetic nephropathy: Secondary | ICD-10-CM

## 2017-08-16 DIAGNOSIS — E876 Hypokalemia: Secondary | ICD-10-CM

## 2017-08-16 DIAGNOSIS — I10 Essential (primary) hypertension: Secondary | ICD-10-CM

## 2017-08-16 DIAGNOSIS — Z860100 Personal history of colon polyps, unspecified: Secondary | ICD-10-CM

## 2017-08-16 DIAGNOSIS — M150 Primary generalized (osteo)arthritis: Secondary | ICD-10-CM | POA: Diagnosis not present

## 2017-08-16 DIAGNOSIS — E781 Pure hyperglyceridemia: Secondary | ICD-10-CM | POA: Diagnosis not present

## 2017-08-16 DIAGNOSIS — M159 Polyosteoarthritis, unspecified: Secondary | ICD-10-CM | POA: Insufficient documentation

## 2017-08-16 LAB — BASIC METABOLIC PANEL
BUN: 14 mg/dL (ref 6–23)
CO2: 29 mEq/L (ref 19–32)
Calcium: 9.1 mg/dL (ref 8.4–10.5)
Chloride: 101 mEq/L (ref 96–112)
Creatinine, Ser: 1.25 mg/dL (ref 0.40–1.50)
GFR: 62.12 mL/min (ref >60.00)
Glucose, Bld: 149 mg/dL — ABNORMAL HIGH (ref 70–99)
Potassium: 3.7 mEq/L (ref 3.5–5.1)
Sodium: 138 mEq/L (ref 135–145)

## 2017-08-16 LAB — HEMOGLOBIN A1C: Hgb A1c MFr Bld: 5.7 % (ref 4.6–6.5)

## 2017-08-16 LAB — MAGNESIUM: Magnesium: 1.6 mg/dL (ref 1.5–2.5)

## 2017-08-16 LAB — TRIGLYCERIDES: Triglycerides: 327 mg/dL — ABNORMAL HIGH (ref 0.0–149.0)

## 2017-08-16 MED ORDER — DICLOFENAC 35 MG PO CAPS: 1.0000 | ORAL_CAPSULE | Freq: Three times a day (TID) | ORAL | 3 refills | Status: DC | PRN

## 2017-08-16 NOTE — Patient Instructions (Signed)

## 2017-08-16 NOTE — Progress Notes (Signed)
Subjective:  Patient ID: Tim Decker, male    DOB: 01-29-1956  Age: 62 y.o. MRN: [id number]  CC: Hypertension; Diabetes; Hyperlipidemia; and Osteoarthritis   HPI Tim Decker presents for f/up -since I last saw him he has worked on his lifestyle modifications and has been able to lose weight.  He tells me his blood pressure and blood sugars have been well controlled.  He is very active and denies any recent episodes of CP, SOB, DOE, palpitations, edema, or fatigue.  Today, he complains of pain in his large joints.  Over the last few years he has intermittently experienced bilateral shoulder pain, elbow pain, and knee pain.  He is not getting adequate symptom relief with over-the-counter doses of ibuprofen.  Outpatient Medications Prior to Visit  Medication Sig Dispense Refill  . amLODipine (NORVASC) 10 MG tablet Take 1 tablet (10 mg total) by mouth daily. 90 tablet 0  . aspirin 81 MG tablet Take 81 mg by mouth daily.      Marland Kitchen doxazosin (CARDURA) 2 MG tablet Take 1 tablet (2 mg total) by mouth daily. 90 tablet 0  . irbesartan (AVAPRO) 300 MG tablet Take 1 tablet (300 mg total) by mouth daily. 90 tablet 1  . metoprolol succinate (TOPROL-XL) 25 MG 24 hr tablet Take 1 tablet (25 mg total) by mouth daily. 90 tablet 1  . omega-3 acid ethyl esters (LOVAZA) 1 g capsule Take 2 capsules (2 g total) by mouth 2 (two) times daily. 360 capsule 1  . potassium chloride (K-DUR) 10 MEQ tablet Take 1 tablet (10 mEq total) by mouth daily. 90 tablet 0  . rosuvastatin (CRESTOR) 10 MG tablet Take 1 tablet (10 mg total) by mouth daily. 90 tablet 1  . spironolactone (ALDACTONE) 25 MG tablet Take 1 tablet (25 mg total) by mouth daily. 90 tablet 0  . tadalafil (CIALIS) 20 MG tablet TAKE ONE TABLET BY MOUTH EVERY 3 DAYS AS NEEDED 6 tablet 2  . Empagliflozin-Metformin HCl (SYNJARDY) 08-998 MG TABS Take 1 tablet by mouth 2 (two) times daily. 180 tablet 1  . niacin 500 MG tablet Take 500 mg by mouth daily with  breakfast.      . Omega-3 Fatty Acids (FISH OIL) 1000 MG CAPS Take by mouth daily.      . vitamin E 400 UNIT capsule Take 400 Units by mouth daily.       No facility-administered medications prior to visit.     ROS Review of Systems  Constitutional: Negative.  Negative for appetite change, diaphoresis, fatigue and fever.  Eyes: Negative for visual disturbance.  Respiratory: Negative for chest tightness and shortness of breath.   Cardiovascular: Negative for chest pain and leg swelling.  Gastrointestinal: Negative for abdominal pain and nausea.  Endocrine: Negative for polyuria.  Genitourinary: Negative.  Negative for difficulty urinating.  Musculoskeletal: Positive for arthralgias. Negative for back pain, myalgias and neck pain.  Skin: Negative.  Negative for color change.  Neurological: Negative.  Negative for dizziness and weakness.  Hematological: Negative for adenopathy. Does not bruise/bleed easily.  Psychiatric/Behavioral: Negative.     Objective:  BP 130/80 (BP Location: Left Arm, Patient Position: Sitting, Cuff Size: Large)   Pulse 84   Temp 97.9 F (36.6 C) (Oral)   Ht 5\' 9"  (1.753 m)   Wt 227 lb (103 kg)   SpO2 95%   BMI 33.52 kg/m   BP Readings from Last 3 Encounters:  08/16/17 130/80  05/10/17 (!) 158/86  08/11/16 124/82  Wt Readings from Last 3 Encounters:  08/16/17 227 lb (103 kg)  05/10/17 236 lb 1.3 oz (107.1 kg)  08/11/16 233 lb (105.7 kg)    Physical Exam  Constitutional: He is oriented to person, place, and time. No distress.  HENT:  Mouth/Throat: Oropharynx is clear and moist. No oropharyngeal exudate.  Eyes: Conjunctivae are normal. No scleral icterus.  Neck: Normal range of motion. Neck supple. No JVD present.  Cardiovascular: Normal rate, regular rhythm and normal heart sounds. Exam reveals no gallop and no friction rub.  No murmur heard. Pulmonary/Chest: Effort normal and breath sounds normal. No stridor. No respiratory distress. He has  no wheezes. He has no rales.  Abdominal: Soft. Bowel sounds are normal. He exhibits no mass. There is no tenderness. No hernia.  Musculoskeletal: Normal range of motion. He exhibits no edema, tenderness or deformity.  DJD noted in shoulders, knees, and elbows.  Neurological: He is alert and oriented to person, place, and time.  Skin: Skin is warm and dry. He is not diaphoretic.  Psychiatric: He has a normal mood and affect. His behavior is normal. Judgment and thought content normal.  Vitals reviewed.   Lab Results  Component Value Date   WBC 7.4 05/10/2017   HGB 15.5 05/10/2017   HCT 45.3 05/10/2017   PLT 277.0 05/10/2017   GLUCOSE 149 (H) 08/16/2017   CHOL 146 05/10/2017   TRIG 327.0 (H) 08/16/2017   HDL 30.20 (L) 05/10/2017   LDLDIRECT 69.0 05/10/2017   LDLCALC 93 01/15/2014   ALT 33 05/10/2017   AST 20 05/10/2017   NA 138 08/16/2017   K 3.7 08/16/2017   CL 101 08/16/2017   CREATININE 1.25 08/16/2017   BUN 14 08/16/2017   CO2 29 08/16/2017   TSH 1.80 07/28/2015   PSA 0.99 07/26/2016   HGBA1C 5.7 08/16/2017   MICROALBUR 88.8 (H) 05/10/2017    No results found.  Assessment & Plan:   William was seen today for hypertension, diabetes, hyperlipidemia and osteoarthritis.  Diagnoses and all orders for this visit:  Hypokalemia- His potassium level is in the low normal range.  I will screen for primary aldosteronism. -     Aldosterone + renin activity w/ ratio; Future -     Basic metabolic panel; Future -     Magnesium; Future  Diabetes mellitus with proteinuric diabetic nephropathy (HCC)- His A1c is down to 5.7%.  He has made significant improvements with lifestyle modifications.  I advised him to stop taking the metformin and SGLT2 inhibitor. -     Hemoglobin A1c; Future -     Ambulatory referral to Ophthalmology  Essential hypertension- His blood pressure is adequately well controlled. -     Aldosterone + renin activity w/ ratio; Future -     Basic metabolic panel;  Future -     Magnesium; Future  Primary osteoarthritis involving multiple joints -     Discontinue: Diclofenac 35 MG CAPS; Take 1 tablet by mouth 3 (three) times daily with meals as needed. -     Diclofenac 35 MG CAPS; Take 1 tablet by mouth 3 (three) times daily with meals as needed.  COLONIC POLYPS, HX OF -     Ambulatory referral to Gastroenterology  Pure hyperglyceridemia- Improvement noted with lifestyle modification and omega-3 fish oils. -     Triglycerides; Future   I have discontinued Creedon T. Michaux's niacin, Fish Oil, vitamin E, and Empagliflozin-metFORMIN HCl. I am also having him maintain his aspirin, tadalafil, rosuvastatin, irbesartan, metoprolol  succinate, omega-3 acid ethyl esters, spironolactone, amLODipine, doxazosin, potassium chloride, and Diclofenac.  Meds ordered this encounter  Medications  . DISCONTD: Diclofenac 35 MG CAPS    Sig: Take 1 tablet by mouth 3 (three) times daily with meals as needed.    Dispense:  90 capsule    Refill:  3  . Diclofenac 35 MG CAPS    Sig: Take 1 tablet by mouth 3 (three) times daily with meals as needed.    Dispense:  90 capsule    Refill:  3     Follow-up: Return in about 6 months (around 02/16/2018).  Sanda Linger, MD

## 2017-08-17 ENCOUNTER — Encounter: Payer: Self-pay | Admitting: Internal Medicine

## 2017-08-21 LAB — ALDOSTERONE + RENIN ACTIVITY W/ RATIO
ALDO / PRA Ratio: 7.9 Ratio (ref 0.9–28.9)
Aldosterone: 8 ng/dL
Renin Activity: 1.01 ng/mL/h (ref 0.25–5.82)

## 2017-11-14 ENCOUNTER — Other Ambulatory Visit: Payer: Self-pay | Admitting: Internal Medicine

## 2017-11-14 DIAGNOSIS — I10 Essential (primary) hypertension: Secondary | ICD-10-CM

## 2017-11-14 DIAGNOSIS — E876 Hypokalemia: Secondary | ICD-10-CM

## 2017-12-19 ENCOUNTER — Other Ambulatory Visit: Payer: Self-pay | Admitting: Internal Medicine

## 2017-12-19 DIAGNOSIS — I10 Essential (primary) hypertension: Secondary | ICD-10-CM

## 2018-02-13 ENCOUNTER — Encounter: Payer: Self-pay | Admitting: Internal Medicine

## 2018-02-18 ENCOUNTER — Other Ambulatory Visit: Payer: Self-pay | Admitting: Internal Medicine

## 2018-03-01 ENCOUNTER — Other Ambulatory Visit: Payer: Self-pay | Admitting: Internal Medicine

## 2018-03-01 DIAGNOSIS — I10 Essential (primary) hypertension: Secondary | ICD-10-CM

## 2018-03-01 DIAGNOSIS — E876 Hypokalemia: Secondary | ICD-10-CM

## 2018-04-25 ENCOUNTER — Other Ambulatory Visit: Payer: Self-pay | Admitting: Internal Medicine

## 2018-04-25 DIAGNOSIS — E781 Pure hyperglyceridemia: Secondary | ICD-10-CM

## 2018-05-09 ENCOUNTER — Encounter: Payer: Self-pay | Admitting: Internal Medicine

## 2018-06-04 ENCOUNTER — Telehealth: Payer: Self-pay | Admitting: Internal Medicine

## 2018-06-04 DIAGNOSIS — I10 Essential (primary) hypertension: Secondary | ICD-10-CM

## 2018-06-04 DIAGNOSIS — E876 Hypokalemia: Secondary | ICD-10-CM

## 2018-06-04 NOTE — Telephone Encounter (Signed)
Copied from CRM [id number]. Topic: Quick Communication - Rx Refill/Question >> Jun 04, 2018  2:18 PM Lyn Hollingshead, [facility] wrote: Medication: omega-3 acid ethyl esters (LOVAZA) 1 g capsule  Pt states that his insurance will not cover this medication (over $600) - he wants to know if there is an alternative medication that he can try.  Has the patient contacted their pharmacy? No - he needs to know what alternatives are (Agent: If no, request that the patient contact the pharmacy for the refill.) (Agent: If yes, when and what did the pharmacy advise?)  Preferred Pharmacy (with phone number or street name): [facility] [address] [place] - [address] [phone] (Phone) [phone] (Fax)  Agent: Please be advised that RX refills may take up to 3 business days. We ask that you follow-up with your pharmacy.

## 2018-06-04 NOTE — Telephone Encounter (Signed)
Copied from CRM #[id number]. Topic: Quick Communication - Rx Refill/Question °>> Jun 04, 2018  2:18 PM Alexander, Amber L wrote: °Medication: omega-3 acid ethyl esters (LOVAZA) 1 g capsule ° °Pt states that his insurance will not cover this medication (over $600) - he wants to know if there is an alternative medication that he can try. ° °Has the patient contacted their pharmacy? No - he needs to know what alternatives are °(Agent: If no, request that the patient contact the pharmacy for the refill.) °(Agent: If yes, when and what did the pharmacy advise?) ° °Preferred Pharmacy (with phone number or street name): [id number] - [place], [place] - [address] [phone] (Phone) °[phone] (Fax) ° °Agent: Please be advised that RX refills may take up to 3 business days. We ask that you follow-up with your pharmacy. °

## 2018-06-07 NOTE — Telephone Encounter (Signed)
Lovaza denied,   Alternative or Appeal?   Please advise

## 2018-06-07 NOTE — Telephone Encounter (Signed)
Denied LOVAZA Vascepa must be trialed and failed before coverage considered.

## 2018-06-07 NOTE — Telephone Encounter (Signed)
Key: [id number]

## 2018-06-08 NOTE — Telephone Encounter (Signed)
Appeal  TJ

## 2018-06-12 ENCOUNTER — Other Ambulatory Visit: Payer: Self-pay | Admitting: Internal Medicine

## 2018-06-12 DIAGNOSIS — I10 Essential (primary) hypertension: Secondary | ICD-10-CM

## 2018-06-12 DIAGNOSIS — E876 Hypokalemia: Secondary | ICD-10-CM

## 2018-06-19 ENCOUNTER — Other Ambulatory Visit: Payer: Self-pay | Admitting: Internal Medicine

## 2018-06-19 DIAGNOSIS — I10 Essential (primary) hypertension: Secondary | ICD-10-CM

## 2018-06-19 DIAGNOSIS — E876 Hypokalemia: Secondary | ICD-10-CM

## 2018-06-19 NOTE — Telephone Encounter (Signed)
   Pt stated that his [employer]: [id number] The ID that had was incorrect.

## 2018-06-19 NOTE — Telephone Encounter (Signed)
Called [employer] to appeal the denied PA. [employer] stated that they do not have a subscriber by that name.  Need to call pt to confirm prescription coverage.

## 2018-06-20 ENCOUNTER — Other Ambulatory Visit: Payer: Self-pay | Admitting: Internal Medicine

## 2018-06-20 DIAGNOSIS — E781 Pure hyperglyceridemia: Secondary | ICD-10-CM

## 2018-06-20 MED ORDER — ICOSAPENT ETHYL 1 G PO CAPS: 2.0000 | ORAL_CAPSULE | Freq: Two times a day (BID) | ORAL | 1 refills | Status: DC

## 2018-06-20 NOTE — Telephone Encounter (Signed)
Insurance prefers the [place]. Can that be sent in instead?

## 2018-06-20 NOTE — Telephone Encounter (Signed)
Re-entered the PA and the PA was denied.   Will enter it into an appeal.

## 2018-06-20 NOTE — Telephone Encounter (Signed)
Can you give me some reasons the Lovaza is preferred over the Vascepa?   I am putting a letter together for the appeal.

## 2018-06-20 NOTE — Telephone Encounter (Signed)
No, I prefer vascepa over lovaza because vascepa does not alter the effects of the statin and reduces the risks of heart attack and stroke

## 2018-06-20 NOTE — Telephone Encounter (Signed)
done

## 2018-06-21 ENCOUNTER — Other Ambulatory Visit: Payer: Self-pay

## 2018-06-21 MED ORDER — SPIRONOLACTONE 25 MG PO TABS: 25.0000 mg | ORAL_TABLET | Freq: Every day | ORAL | 1 refills | Status: DC

## 2018-06-21 NOTE — Telephone Encounter (Signed)
Need to do a PA for a tier exception.

## 2018-06-21 NOTE — Telephone Encounter (Signed)
Spoke to pt and informed of change from Lovaza to Vascepa. Pt stated that the Vascepa costs more than the Lovaza at 900 for a 90 day supply.   Pt also requested a refill of the spironolactone. Erx has been sent in. Pt asked what the diclofenac was for. Informed it was for arthritis pain that he complained about in the May 2019 visit. Pt stated that he no longer needs that medication and that medication has been removed from rx list.   Will do PA for the Vascepa to see if I can get a tier exception for that medication.  Will call to pharmacy for rx insurance information.

## 2018-06-21 NOTE — Addendum Note (Signed)
Addended by: Verlan Friends on: 06/21/2018 08:26 AM   Modules accepted: Orders

## 2018-06-24 NOTE — Telephone Encounter (Signed)
PA for Vascepa - Key: [id number]

## 2018-06-25 NOTE — Telephone Encounter (Signed)
PA to lower cost was denied.   Call to pt. I have a number that pt can call to try and get patient assistance.   Sending number to pt mychart account.

## 2018-07-04 ENCOUNTER — Other Ambulatory Visit: Payer: Self-pay

## 2018-07-04 ENCOUNTER — Encounter: Payer: Self-pay | Admitting: Internal Medicine

## 2018-07-04 ENCOUNTER — Ambulatory Visit (INDEPENDENT_AMBULATORY_CARE_PROVIDER_SITE_OTHER): Payer: BLUE CROSS/BLUE SHIELD | Admitting: Internal Medicine

## 2018-07-04 ENCOUNTER — Other Ambulatory Visit (INDEPENDENT_AMBULATORY_CARE_PROVIDER_SITE_OTHER): Payer: BLUE CROSS/BLUE SHIELD

## 2018-07-04 VITALS — BP 142/82 | HR 89 | Temp 98.1°F | Ht 69.0 in | Wt 246.2 lb

## 2018-07-04 DIAGNOSIS — R809 Proteinuria, unspecified: Secondary | ICD-10-CM

## 2018-07-04 DIAGNOSIS — Z0000 Encounter for general adult medical examination without abnormal findings: Secondary | ICD-10-CM

## 2018-07-04 DIAGNOSIS — I10 Essential (primary) hypertension: Secondary | ICD-10-CM

## 2018-07-04 DIAGNOSIS — E785 Hyperlipidemia, unspecified: Secondary | ICD-10-CM

## 2018-07-04 DIAGNOSIS — E1121 Type 2 diabetes mellitus with diabetic nephropathy: Secondary | ICD-10-CM | POA: Diagnosis not present

## 2018-07-04 DIAGNOSIS — E559 Vitamin D deficiency, unspecified: Secondary | ICD-10-CM

## 2018-07-04 DIAGNOSIS — Z125 Encounter for screening for malignant neoplasm of prostate: Secondary | ICD-10-CM | POA: Diagnosis not present

## 2018-07-04 DIAGNOSIS — E781 Pure hyperglyceridemia: Secondary | ICD-10-CM

## 2018-07-04 DIAGNOSIS — R0683 Snoring: Secondary | ICD-10-CM | POA: Insufficient documentation

## 2018-07-04 DIAGNOSIS — Z1211 Encounter for screening for malignant neoplasm of colon: Secondary | ICD-10-CM | POA: Insufficient documentation

## 2018-07-04 LAB — CBC WITH DIFFERENTIAL/PLATELET
Basophils Absolute: 0 10*3/uL (ref 0.0–0.1)
Basophils Relative: 0.7 % (ref 0.0–3.0)
Eosinophils Absolute: 0.3 10*3/uL (ref 0.0–0.7)
Eosinophils Relative: 3.8 % (ref 0.0–5.0)
HCT: 43.4 % (ref 39.0–52.0)
Hemoglobin: 15 g/dL (ref 13.0–17.0)
Lymphocytes Relative: 23.3 % (ref 12.0–46.0)
Lymphs Abs: 1.6 10*3/uL (ref 0.7–4.0)
MCHC: 34.6 g/dL (ref 30.0–36.0)
MCV: 90.7 fl (ref 78.0–100.0)
Monocytes Absolute: 0.7 10*3/uL (ref 0.1–1.0)
Monocytes Relative: 10.5 % (ref 3.0–12.0)
Neutro Abs: 4.3 10*3/uL (ref 1.4–7.7)
Neutrophils Relative %: 61.7 % (ref 43.0–77.0)
Platelets: 273 10*3/uL (ref 150.0–400.0)
RBC: 4.79 Mil/uL (ref 4.22–5.81)
RDW: 14.7 % (ref 11.5–15.5)
WBC: 7 10*3/uL (ref 4.0–10.5)

## 2018-07-04 LAB — COMPREHENSIVE METABOLIC PANEL
ALT: 36 U/L (ref 0–53)
AST: 22 U/L (ref 0–37)
Albumin: 4.3 g/dL (ref 3.5–5.2)
Alkaline Phosphatase: 50 U/L (ref 39–117)
BUN: 15 mg/dL (ref 6–23)
CO2: 26 mEq/L (ref 19–32)
Calcium: 9.1 mg/dL (ref 8.4–10.5)
Chloride: 104 mEq/L (ref 96–112)
Creatinine, Ser: 1.26 mg/dL (ref 0.40–1.50)
GFR: 57.75 mL/min — ABNORMAL LOW (ref >60.00)
Glucose, Bld: 116 mg/dL — ABNORMAL HIGH (ref 70–99)
Potassium: 4.2 mEq/L (ref 3.5–5.1)
Sodium: 138 mEq/L (ref 135–145)
Total Bilirubin: 0.6 mg/dL (ref 0.2–1.2)
Total Protein: 6.8 g/dL (ref 6.0–8.3)

## 2018-07-04 LAB — MICROALBUMIN / CREATININE URINE RATIO
Creatinine,U: 150.1 mg/dL
Microalb Creat Ratio: 60 mg/g — ABNORMAL HIGH (ref 0.0–30.0)
Microalb, Ur: 90.1 mg/dL — ABNORMAL HIGH (ref 0.0–1.9)

## 2018-07-04 LAB — URINALYSIS, ROUTINE W REFLEX MICROSCOPIC
Bilirubin Urine: NEGATIVE
Ketones, ur: NEGATIVE
Leukocytes,Ua: NEGATIVE
Nitrite: NEGATIVE
Specific Gravity, Urine: >=1.03 — AB (ref 1.000–1.030)
Total Protein, Urine: 100 — AB
Urine Glucose: NEGATIVE
Urobilinogen, UA: 0.2 (ref 0.0–1.0)
pH: 5.5 (ref 5.0–8.0)

## 2018-07-04 LAB — LIPID PANEL
Cholesterol: 137 mg/dL (ref 0–200)
HDL: 25.4 mg/dL — ABNORMAL LOW (ref >39.00)
Total CHOL/HDL Ratio: 5
Triglycerides: 452 mg/dL — ABNORMAL HIGH (ref 0.0–149.0)

## 2018-07-04 LAB — VITAMIN D 25 HYDROXY (VIT D DEFICIENCY, FRACTURES): VITD: 18.86 ng/mL — ABNORMAL LOW (ref 30.00–100.00)

## 2018-07-04 LAB — TSH: TSH: 2.2 u[IU]/mL (ref 0.35–4.50)

## 2018-07-04 LAB — LDL CHOLESTEROL, DIRECT: Direct LDL: 49 mg/dL

## 2018-07-04 LAB — HEMOGLOBIN A1C: Hgb A1c MFr Bld: 7.3 % — ABNORMAL HIGH (ref 4.6–6.5)

## 2018-07-04 LAB — PSA: PSA: 0.97 ng/mL (ref 0.10–4.00)

## 2018-07-04 MED ORDER — ICOSAPENT ETHYL 1 G PO CAPS: 2.0000 | ORAL_CAPSULE | Freq: Two times a day (BID) | ORAL | 1 refills | Status: DC

## 2018-07-04 MED ORDER — CHOLECALCIFEROL 1.25 MG (50000 UT) PO CAPS: 50000.0000 [IU] | ORAL_CAPSULE | ORAL | 0 refills | Status: DC

## 2018-07-04 MED ORDER — CANAGLIFLOZIN-METFORMIN HCL ER 150-1000 MG PO TB24: 1.0000 | ORAL_TABLET | Freq: Every day | ORAL | 1 refills | Status: DC

## 2018-07-04 MED ORDER — ROSUVASTATIN CALCIUM 10 MG PO TABS: 10.0000 mg | ORAL_TABLET | Freq: Every day | ORAL | 1 refills | Status: DC

## 2018-07-04 NOTE — Progress Notes (Signed)
Subjective:  Patient ID: Tim Decker, male    DOB: Jul 02, 1955  Age: 63 y.o. MRN: [id number]  CC: Annual Exam; Hypertension; Diabetes; and Hyperlipidemia   HPI [name] presents for a CPX.  He does not monitor his blood pressure or his blood sugar.  He has not been working on his lifestyle modifications.  He has gained weight over the last year.  His wife has noticed that he has heavy snoring.  He is active and denies any recent episodes of CP, DOE, palpitations, edema, or fatigue.  He has rare heartburn during the night which is well controlled with over-the-counter doses of Pepcid.  Outpatient Medications Prior to Visit  Medication Sig Dispense Refill  . amLODipine (NORVASC) 10 MG tablet TAKE 1 TABLET BY MOUTH ONCE DAILY 90 tablet 1  . aspirin 81 MG tablet Take 81 mg by mouth daily.      Marland Kitchen doxazosin (CARDURA) 2 MG tablet TAKE 1 TABLET BY MOUTH ONCE DAILY 90 tablet 1  . irbesartan (AVAPRO) 300 MG tablet TAKE 1 TABLET BY MOUTH ONCE DAILY 90 tablet 1  . metoprolol succinate (TOPROL-XL) 25 MG 24 hr tablet TAKE 1 TABLET BY MOUTH ONCE DAILY 90 tablet 1  . potassium chloride (K-DUR) 10 MEQ tablet TAKE 1 TABLET BY MOUTH ONCE DAILY 90 tablet 1  . spironolactone (ALDACTONE) 25 MG tablet Take 1 tablet (25 mg total) by mouth daily. 90 tablet 1  . rosuvastatin (CRESTOR) 10 MG tablet TAKE 1 TABLET BY MOUTH ONCE DAILY 90 tablet 1  . tadalafil (CIALIS) 20 MG tablet TAKE ONE TABLET BY MOUTH EVERY 3 DAYS AS NEEDED (Patient not taking: Reported on 07/04/2018) 6 tablet 2  . Icosapent Ethyl (VASCEPA) 1 g CAPS Take 2 capsules (2 g total) by mouth 2 (two) times daily. (Patient not taking: Reported on 07/04/2018) 360 capsule 1   No facility-administered medications prior to visit.     ROS Review of Systems  Constitutional: Positive for unexpected weight change. Negative for appetite change, diaphoresis and fatigue.  HENT: Negative.  Negative for trouble swallowing and voice change.   Eyes:  Negative for visual disturbance.  Respiratory: Negative for apnea, cough, chest tightness, shortness of breath and wheezing.   Cardiovascular: Negative for chest pain, palpitations and leg swelling.  Gastrointestinal: Negative for abdominal pain, constipation, diarrhea, nausea and vomiting.  Endocrine: Positive for polyphagia. Negative for polydipsia and polyuria.  Genitourinary: Negative.  Negative for difficulty urinating, dysuria, penile swelling, scrotal swelling, testicular pain and urgency.  Musculoskeletal: Negative.  Negative for arthralgias and myalgias.  Skin: Negative.  Negative for color change, pallor and rash.  Neurological: Negative.  Negative for dizziness, weakness, light-headedness and headaches.  Hematological: Negative for adenopathy. Does not bruise/bleed easily.  Psychiatric/Behavioral: Negative.     Objective:  BP (!) 142/82 (BP Location: Left Arm, Patient Position: Sitting, Cuff Size: Normal)   Pulse 89   Temp 98.1 F (36.7 C) (Oral)   Ht 5\' 9"  (1.753 m)   Wt 246 lb 4 oz (111.7 kg)   SpO2 97%   BMI 36.36 kg/m   BP Readings from Last 3 Encounters:  07/04/18 (!) 142/82  08/16/17 130/80  05/10/17 (!) 158/86    Wt Readings from Last 3 Encounters:  07/04/18 246 lb 4 oz (111.7 kg)  08/16/17 227 lb (103 kg)  05/10/17 236 lb 1.3 oz (107.1 kg)    Physical Exam Vitals signs reviewed.  Constitutional:      Appearance: He is obese. He is  not ill-appearing or diaphoretic.  HENT:     Nose: Nose normal. No congestion or rhinorrhea.     Mouth/Throat:     Mouth: Mucous membranes are moist.     Pharynx: Oropharynx is clear. No oropharyngeal exudate or posterior oropharyngeal erythema.  Eyes:     General: No scleral icterus.    Conjunctiva/sclera: Conjunctivae normal.     Pupils: Pupils are equal, round, and reactive to light.  Neck:     Musculoskeletal: Normal range of motion and neck supple. No neck rigidity or muscular tenderness.  Cardiovascular:     Rate  and Rhythm: Normal rate and regular rhythm.     Heart sounds: No murmur. No gallop.      Comments: EKG ----  Sinus  Rhythm  -Left axis -anterior fascicular block.   Low voltage with rightward P-axis and rotation -possible pulmonary disease.   ABNORMAL   Pulmonary:     Effort: Pulmonary effort is normal.     Breath sounds: Normal breath sounds. No stridor. No wheezing, rhonchi or rales.  Abdominal:     General: Abdomen is protuberant. Bowel sounds are normal.     Palpations: Abdomen is soft. There is no hepatomegaly, splenomegaly or mass.     Tenderness: There is no abdominal tenderness. There is no guarding.     Hernia: There is no hernia in the right inguinal area or left inguinal area.  Genitourinary:    Pubic Area: No rash.      Penis: Normal. No discharge, swelling or lesions.      Scrotum/Testes: Normal.        Right: Mass not present.        Left: Mass not present.     Epididymis:     Right: Normal.     Left: Normal.     Prostate: Enlarged (1+ smooth symm BPH). Not tender and no nodules present.     Rectum: Normal. Guaiac result negative. No mass, tenderness, anal fissure, external hemorrhoid or internal hemorrhoid. Normal anal tone.  Musculoskeletal: Normal range of motion.        General: No swelling.     Right lower leg: No edema.     Left lower leg: No edema.  Lymphadenopathy:     Lower Body: No right inguinal adenopathy. No left inguinal adenopathy.  Skin:    General: Skin is warm and dry.     Coloration: Skin is not pale.     Findings: No erythema or rash.  Neurological:     General: No focal deficit present.     Mental Status: He is oriented to person, place, and time. Mental status is at baseline.     Lab Results  Component Value Date   WBC 7.0 07/04/2018   HGB 15.0 07/04/2018   HCT 43.4 07/04/2018   PLT 273.0 07/04/2018   GLUCOSE 116 (H) 07/04/2018   CHOL 137 07/04/2018   TRIG (H) 07/04/2018    452.0 Triglyceride is over 400; calculations on Lipids  are invalid.   HDL 25.40 (L) 07/04/2018   LDLDIRECT 49.0 07/04/2018   LDLCALC 93 01/15/2014   ALT 36 07/04/2018   AST 22 07/04/2018   NA 138 07/04/2018   K 4.2 07/04/2018   CL 104 07/04/2018   CREATININE 1.26 07/04/2018   BUN 15 07/04/2018   CO2 26 07/04/2018   TSH 2.20 07/04/2018   PSA 0.97 07/04/2018   HGBA1C 7.3 (H) 07/04/2018   MICROALBUR 90.1 (H) 07/04/2018    [place] Carotid  Result Date: 05/16/2017 Carotid Arterial Duplex Study Indications:  Bruit. Risk Factors: Hypertension, hyperlipidemia, current smoker. Examination Guidelines: A complete evaluation includes B-mode imaging, spectral doppler, color doppler, and power doppler as needed of all accessible portions of each vessel. Bilateral testing is considered an integral part of a complete examination. Limited examinations for reoccurring indications may be performed as noted.  Right Carotid Findings: +----------+--------+--------+--------+------------+--------+           PSV cm/sEDV cm/sStenosisDescribe    Comments +----------+--------+--------+--------+------------+--------+ CCA Prox  112     26                                   +----------+--------+--------+--------+------------+--------+ CCA Mid   60      20                                   +----------+--------+--------+--------+------------+--------+ CCA Distal60      18              heterogenous         +----------+--------+--------+--------+------------+--------+ ICA Prox  58      22      1-39%   heterogenous         +----------+--------+--------+--------+------------+--------+ ICA Mid   61      24                                   +----------+--------+--------+--------+------------+--------+ ICA Distal48      19                                   +----------+--------+--------+--------+------------+--------+ ECA       138     30              heterogenous         +----------+--------+--------+--------+------------+--------+  +----------+--------+-------+----------------+-------------------+           PSV cm/sEDV cmsDescribe        Arm Pressure (mmHG) +----------+--------+-------+----------------+-------------------+ [id number]             Multiphasic, WNL                    +----------+--------+-------+----------------+-------------------+ +---------+--------+--+--------+-+---------+ VertebralPSV cm/s18EDV cm/s8Antegrade +---------+--------+--+--------+-+---------+  Left Carotid Findings: +----------+--------+--------+--------+------------+--------+           PSV cm/sEDV cm/sStenosisDescribe    Comments +----------+--------+--------+--------+------------+--------+ CCA Prox  105     26                                   +----------+--------+--------+--------+------------+--------+ CCA Mid   85      27                                   +----------+--------+--------+--------+------------+--------+ CCA Distal59      24              heterogenous         +----------+--------+--------+--------+------------+--------+ ICA Prox  73      21      1-39%   heterogenoustortuous +----------+--------+--------+--------+------------+--------+ ICA Mid   36      16                                   +----------+--------+--------+--------+------------+--------+  ICA Distal32      15                          tortuous +----------+--------+--------+--------+------------+--------+ ECA       86      20                                   +----------+--------+--------+--------+------------+--------+ +----------+--------+--------+----------------+-------------------+ SubclavianPSV cm/sEDV cm/sDescribe        Arm Pressure (mmHG) +----------+--------+--------+----------------+-------------------+           72              Multiphasic, WNL                    +----------+--------+--------+----------------+-------------------+ +---------+--------+--+--------+--+---------+ VertebralPSV  cm/s43EDV cm/s18Antegrade +---------+--------+--+--------+--+---------+  Final Interpretation: Right Carotid: Velocities in the right ICA are consistent with a 1-39% stenosis. Left Carotid: Velocities in the left ICA are consistent with a 1-39% stenosis. Vertebrals:  Both vertebral arteries were patent with antegrade flow. Subclavians: Normal flow hemodynamics were seen in bilateral subclavian              arteries. *See table(s) above for measurements and observations.  Electronically signed by Waverly Ferrari on 05/16/2017 at 11:29:26 AM.    Assessment & Plan:   Tavin was seen today for annual exam, hypertension, diabetes and hyperlipidemia.  Diagnoses and all orders for this visit:  Essential hypertension- His blood pressure is not quite adequately well controlled.  He agrees to be compliant with the current meds.  I think the best approach to lowering his blood pressure is to work on his lifestyle modifications.  I will also screen him for sleep apnea and will treat the vitamin D deficiency. -     CBC with Differential/Platelet; Future -     Comprehensive metabolic panel; Future -     TSH; Future -     Urinalysis, Routine w reflex microscopic; Future -     VITAMIN D 25 Hydroxy (Vit-D Deficiency, Fractures); Future -     EKG 12-Lead  Diabetes mellitus with proteinuric diabetic nephropathy (HCC)- His A1c is up to 7.3%.  I have asked him to start taking metformin and an SGLT2 inhibitor to control his blood sugar and for cardiovascular risk reduction. -     Comprehensive metabolic panel; Future -     Hemoglobin A1c; Future -     Ambulatory referral to Ophthalmology -     HM Diabetes Foot Exam -     Canagliflozin-metFORMIN HCl ER (INVOKAMET XR) [phone] MG TB24; Take 1 tablet by mouth daily.  Hyperlipidemia LDL goal <130- He has achieved his LDL goal and is doing well on the statin. -     Comprehensive metabolic panel; Future -     TSH; Future -     rosuvastatin (CRESTOR) 10 MG  tablet; Take 1 tablet (10 mg total) by mouth daily.  Microalbuminuria- His microalbuminuria is stable.  I recommended that he start taking Invokana to mitigate the progression and complications of diabetic kidney disease. -     Microalbumin / creatinine urine ratio; Future -     Canagliflozin-metFORMIN HCl ER (INVOKAMET XR) [phone] MG TB24; Take 1 tablet by mouth daily.  Routine general medical examination at a health care facility- Exam completed, labs reviewed, vaccines reviewed and updated, Cologuard is ordered to screen for colon cancer/polyps.  Patient education material was  given. -     Lipid panel; Future -     PSA; Future -     HIV Antibody (routine testing w rflx); Future  Colon cancer screening -     Cologuard  Snoring -     Ambulatory referral to Sleep Studies  Vitamin D deficiency disease -     Cholecalciferol 1.25 MG (50000 UT) capsule; Take 1 capsule (50,000 Units total) by mouth once a week.  Pure hyperglyceridemia -     Icosapent Ethyl (VASCEPA) 1 g CAPS; Take 2 capsules (2 g total) by mouth 2 (two) times daily.   I have changed Amber T. Papa's rosuvastatin. I am also having him start on Cholecalciferol and Canagliflozin-metFORMIN HCl ER. Additionally, I am having him maintain his aspirin, tadalafil, metoprolol succinate, irbesartan, doxazosin, amLODipine, potassium chloride, spironolactone, and Icosapent Ethyl.  Meds ordered this encounter  Medications  . Cholecalciferol 1.25 MG (50000 UT) capsule    Sig: Take 1 capsule (50,000 Units total) by mouth once a week.    Dispense:  12 capsule    Refill:  0  . Canagliflozin-metFORMIN HCl ER (INVOKAMET XR) [phone] MG TB24    Sig: Take 1 tablet by mouth daily.    Dispense:  90 tablet    Refill:  1  . Icosapent Ethyl (VASCEPA) 1 g CAPS    Sig: Take 2 capsules (2 g total) by mouth 2 (two) times daily.    Dispense:  360 capsule    Refill:  1  . rosuvastatin (CRESTOR) 10 MG tablet    Sig: Take 1 tablet (10 mg total) by  mouth daily.    Dispense:  90 tablet    Refill:  1     Follow-up: Return in about 3 months (around 10/04/2018).  Sanda Linger, MD

## 2018-07-04 NOTE — Patient Instructions (Signed)

## 2018-07-05 ENCOUNTER — Encounter: Payer: Self-pay | Admitting: Internal Medicine

## 2018-07-05 LAB — HIV ANTIBODY (ROUTINE TESTING W REFLEX): HIV 1&2 Ab, 4th Generation: NONREACTIVE

## 2018-07-09 ENCOUNTER — Other Ambulatory Visit: Payer: Self-pay | Admitting: Internal Medicine

## 2018-07-09 DIAGNOSIS — E781 Pure hyperglyceridemia: Secondary | ICD-10-CM

## 2018-07-09 MED ORDER — OMEGA-3-ACID ETHYL ESTERS 1 G PO CAPS: 2.0000 g | ORAL_CAPSULE | Freq: Two times a day (BID) | ORAL | 1 refills | Status: DC

## 2018-07-10 ENCOUNTER — Other Ambulatory Visit: Payer: Self-pay | Admitting: Internal Medicine

## 2018-07-10 DIAGNOSIS — I10 Essential (primary) hypertension: Secondary | ICD-10-CM

## 2018-07-15 ENCOUNTER — Other Ambulatory Visit: Payer: Self-pay | Admitting: Internal Medicine

## 2018-07-15 DIAGNOSIS — E1121 Type 2 diabetes mellitus with diabetic nephropathy: Secondary | ICD-10-CM

## 2018-07-15 MED ORDER — METFORMIN HCL 1000 MG PO TABS: 1000.0000 mg | ORAL_TABLET | Freq: Two times a day (BID) | ORAL | 1 refills | Status: DC

## 2018-07-18 ENCOUNTER — Ambulatory Visit (INDEPENDENT_AMBULATORY_CARE_PROVIDER_SITE_OTHER): Payer: BLUE CROSS/BLUE SHIELD

## 2018-07-18 ENCOUNTER — Encounter: Payer: Self-pay | Admitting: Internal Medicine

## 2018-07-18 ENCOUNTER — Telehealth: Payer: Self-pay

## 2018-07-18 ENCOUNTER — Ambulatory Visit: Payer: BLUE CROSS/BLUE SHIELD

## 2018-07-18 ENCOUNTER — Other Ambulatory Visit: Payer: Self-pay

## 2018-07-18 DIAGNOSIS — Z23 Encounter for immunization: Secondary | ICD-10-CM

## 2018-07-18 DIAGNOSIS — Z299 Encounter for prophylactic measures, unspecified: Secondary | ICD-10-CM

## 2018-07-18 NOTE — Telephone Encounter (Signed)
Patient should get pneumo 23 vaccine first per dr Yetta Barre

## 2018-08-12 LAB — COLOGUARD: Cologuard: POSITIVE — AB

## 2018-08-22 ENCOUNTER — Telehealth: Payer: Self-pay

## 2018-08-22 DIAGNOSIS — R195 Other fecal abnormalities: Secondary | ICD-10-CM

## 2018-08-22 NOTE — Progress Notes (Signed)
Pt has been informed of the positive cologuard result. Referral for GI has been entered.

## 2018-08-22 NOTE — Telephone Encounter (Signed)
Referral to GI due to positive cologuard test. Pt has been informed. PCP has been informed and agreed.

## 2018-09-04 ENCOUNTER — Other Ambulatory Visit: Payer: Self-pay | Admitting: Internal Medicine

## 2018-12-05 ENCOUNTER — Other Ambulatory Visit: Payer: Self-pay | Admitting: Internal Medicine

## 2019-01-05 ENCOUNTER — Other Ambulatory Visit: Payer: Self-pay | Admitting: Internal Medicine

## 2019-01-05 DIAGNOSIS — E781 Pure hyperglyceridemia: Secondary | ICD-10-CM

## 2019-01-05 DIAGNOSIS — I10 Essential (primary) hypertension: Secondary | ICD-10-CM

## 2019-01-05 DIAGNOSIS — E876 Hypokalemia: Secondary | ICD-10-CM

## 2019-01-09 ENCOUNTER — Other Ambulatory Visit: Payer: Self-pay | Admitting: Internal Medicine

## 2019-01-20 ENCOUNTER — Other Ambulatory Visit: Payer: Self-pay | Admitting: Internal Medicine

## 2019-01-20 DIAGNOSIS — E785 Hyperlipidemia, unspecified: Secondary | ICD-10-CM

## 2019-01-20 DIAGNOSIS — I10 Essential (primary) hypertension: Secondary | ICD-10-CM

## 2019-01-24 ENCOUNTER — Encounter: Payer: Self-pay | Admitting: Internal Medicine

## 2019-02-10 ENCOUNTER — Ambulatory Visit: Payer: Self-pay | Admitting: *Deleted

## 2019-02-10 ENCOUNTER — Encounter (HOSPITAL_BASED_OUTPATIENT_CLINIC_OR_DEPARTMENT_OTHER): Payer: Self-pay | Admitting: Emergency Medicine

## 2019-02-10 ENCOUNTER — Other Ambulatory Visit: Payer: Self-pay

## 2019-02-10 ENCOUNTER — Emergency Department (HOSPITAL_BASED_OUTPATIENT_CLINIC_OR_DEPARTMENT_OTHER)
Admission: EM | Admit: 2019-02-10 | Discharge: 2019-02-10 | Disposition: A | Discharge status: Home / Self Care | Payer: BC Managed Care – PPO | Attending: Emergency Medicine | Admitting: Emergency Medicine

## 2019-02-10 ENCOUNTER — Emergency Department (HOSPITAL_BASED_OUTPATIENT_CLINIC_OR_DEPARTMENT_OTHER): Payer: BC Managed Care – PPO

## 2019-02-10 DIAGNOSIS — F17210 Nicotine dependence, cigarettes, uncomplicated: Secondary | ICD-10-CM | POA: Insufficient documentation

## 2019-02-10 DIAGNOSIS — Z7984 Long term (current) use of oral hypoglycemic drugs: Secondary | ICD-10-CM | POA: Diagnosis not present

## 2019-02-10 DIAGNOSIS — Z7982 Long term (current) use of aspirin: Secondary | ICD-10-CM | POA: Diagnosis not present

## 2019-02-10 DIAGNOSIS — E1121 Type 2 diabetes mellitus with diabetic nephropathy: Secondary | ICD-10-CM

## 2019-02-10 DIAGNOSIS — E785 Hyperlipidemia, unspecified: Secondary | ICD-10-CM

## 2019-02-10 DIAGNOSIS — E119 Type 2 diabetes mellitus without complications: Secondary | ICD-10-CM | POA: Insufficient documentation

## 2019-02-10 DIAGNOSIS — Z79899 Other long term (current) drug therapy: Secondary | ICD-10-CM | POA: Diagnosis not present

## 2019-02-10 DIAGNOSIS — I10 Essential (primary) hypertension: Secondary | ICD-10-CM | POA: Insufficient documentation

## 2019-02-10 DIAGNOSIS — Z760 Encounter for issue of repeat prescription: Secondary | ICD-10-CM | POA: Diagnosis not present

## 2019-02-10 DIAGNOSIS — E876 Hypokalemia: Secondary | ICD-10-CM

## 2019-02-10 LAB — COMPREHENSIVE METABOLIC PANEL
ALT: 29 U/L (ref 0–44)
AST: 24 U/L (ref 15–41)
Albumin: 3.7 g/dL (ref 3.5–5.0)
Alkaline Phosphatase: 51 U/L (ref 38–126)
Anion gap: 9 (ref 5–15)
BUN: 15 mg/dL (ref 8–23)
CO2: 27 mmol/L (ref 22–32)
Calcium: 8.8 mg/dL — ABNORMAL LOW (ref 8.9–10.3)
Chloride: 102 mmol/L (ref 98–111)
Creatinine, Ser: 1.28 mg/dL — ABNORMAL HIGH (ref 0.61–1.24)
GFR calc Af Amer: >60 mL/min (ref >60)
GFR calc non Af Amer: 59 mL/min — ABNORMAL LOW (ref >60)
Glucose, Bld: 122 mg/dL — ABNORMAL HIGH (ref 70–99)
Potassium: 3.5 mmol/L (ref 3.5–5.1)
Sodium: 138 mmol/L (ref 135–145)
Total Bilirubin: 0.5 mg/dL (ref 0.3–1.2)
Total Protein: 6.6 g/dL (ref 6.5–8.1)

## 2019-02-10 LAB — CBC
HCT: 48.3 % (ref 39.0–52.0)
Hemoglobin: 16 g/dL (ref 13.0–17.0)
MCH: 30.7 pg (ref 26.0–34.0)
MCHC: 33.1 g/dL (ref 30.0–36.0)
MCV: 92.7 fL (ref 80.0–100.0)
Platelets: 341 10*3/uL (ref 150–400)
RBC: 5.21 MIL/uL (ref 4.22–5.81)
RDW: 14.9 % (ref 11.5–15.5)
WBC: 7.4 10*3/uL (ref 4.0–10.5)
nRBC: 0 % (ref 0.0–0.2)

## 2019-02-10 LAB — CBG MONITORING, ED: Glucose-Capillary: 118 mg/dL — ABNORMAL HIGH (ref 70–99)

## 2019-02-10 LAB — TROPONIN I (HIGH SENSITIVITY)
Troponin I (High Sensitivity): 26 ng/L — ABNORMAL HIGH (ref <18)
Troponin I (High Sensitivity): 26 ng/L — ABNORMAL HIGH (ref <18)

## 2019-02-10 LAB — MAGNESIUM: Magnesium: 1.8 mg/dL (ref 1.7–2.4)

## 2019-02-10 MED ORDER — SPIRONOLACTONE 25 MG PO TABS: 25.0000 mg | ORAL_TABLET | Freq: Every day | ORAL | 0 refills | Status: DC

## 2019-02-10 MED ORDER — METOPROLOL SUCCINATE ER 25 MG PO TB24
25.0000 mg | ORAL_TABLET | Freq: Every day | ORAL | Status: DC
  Administered 2019-02-10: 25 mg via ORAL
  Filled 2019-02-10: qty 1

## 2019-02-10 MED ORDER — AMLODIPINE BESYLATE 10 MG PO TABS: 10.0000 mg | ORAL_TABLET | Freq: Every day | ORAL | 0 refills | Status: DC

## 2019-02-10 MED ORDER — ROSUVASTATIN CALCIUM 10 MG PO TABS: 10.0000 mg | ORAL_TABLET | Freq: Every day | ORAL | 0 refills | Status: DC

## 2019-02-10 MED ORDER — IRBESARTAN 300 MG PO TABS: 300.0000 mg | ORAL_TABLET | Freq: Every day | ORAL | 0 refills | Status: DC

## 2019-02-10 MED ORDER — AMLODIPINE BESYLATE 5 MG PO TABS
10.0000 mg | ORAL_TABLET | Freq: Once | ORAL | Status: AC
  Administered 2019-02-10: 10 mg via ORAL
  Filled 2019-02-10: qty 2

## 2019-02-10 MED ORDER — METOPROLOL SUCCINATE ER 25 MG PO TB24: 25.0000 mg | ORAL_TABLET | Freq: Every day | ORAL | 0 refills | Status: DC

## 2019-02-10 MED FILL — AMLODIPINE BESYLATE 10 MG T: 10 | 30 days supply | Qty: 30 | Fill #0

## 2019-02-10 MED FILL — SPIRONOLACTONE 25 MG TABLET: 25 | 30 days supply | Qty: 30 | Fill #0

## 2019-02-10 MED FILL — ROSUVASTATIN CALCIUM 10 MG: 10 | 30 days supply | Qty: 30 | Fill #0

## 2019-02-10 MED FILL — METOPROLOL SUCCINATE ER 25: 25 | 30 days supply | Qty: 30 | Fill #0

## 2019-02-10 MED FILL — IRBESARTAN 300 MG TABLET: 300 | 30 days supply | Qty: 30 | Fill #0

## 2019-02-10 NOTE — ED Notes (Signed)
Patient transported to X-ray 

## 2019-02-10 NOTE — Telephone Encounter (Signed)
Pt called in concerned that his BP is really high.   It's 199/124 10 minutes ago when he called in.   He has a headache and a "slight nose bleed" which I never have nose bleeds.  Also stated he felt "short winded".   Denies any other symptoms.    I instructed him to go to the ED.   He was agreeable to this plan.   He is going to the ED at [facility] now. I went over the care advice, s/s to call 911 for.   He verbalized understanding that he will follow the care advice.  I sent a note to Dr. Ronnald Ramp' office so he would be aware of the ED referral.    Reason for Disposition . [0] Systolic BP  >= 086 OR Diastolic >= 761 AND [9] cardiac or neurologic symptoms (e.g., chest pain, difficulty breathing, unsteady gait, blurred vision)  Answer Assessment - Initial Assessment Questions 1. BLOOD PRESSURE: "What is the blood pressure?" "Did you take at least two measurements 5 minutes apart?"     199/124.   I've been out of medication for a while.   The last 2 days it's been high. 2. ONSET: "When did you take your blood pressure?"     199/124 10 minutes ago.      3. HOW: "How did you obtain the blood pressure?" (e.g., visiting nurse, automatic home BP monitor)     I have 2 automatic BP kits. 4. HISTORY: "Do you have a history of high blood pressure?"     Yes 5. MEDICATIONS: "Are you taking any medications for blood pressure?" "Have you missed any doses recently?"     I've been out of medication for a month. 6. OTHER SYMPTOMS: "Do you have any symptoms?" (e.g., headache, chest pain, blurred vision, difficulty breathing, weakness)     I have a headache, short winded, nose bleeds a little, no visual changes. 7. PREGNANCY: "Is there any chance you are pregnant?" "When was your last menstrual period?"     N/A  Protocols used: HIGH BLOOD PRESSURE-A-AH

## 2019-02-10 NOTE — ED Provider Notes (Signed)
[facility] EMERGENCY DEPARTMENT Provider Note   CSN: [id number] Arrival date & time: 02/10/19  7681     History   Chief Complaint Chief Complaint  Patient presents with  . Hypertension    HPI Tim Decker is a 63 y.o. male.     Pt presents to the ED today with high blood pressure.  Pt said he's out of all of his meds.  He has been worried about going to the doctor's office due to Covid.  He lives with his 71 year old mother and is worried that he will bring something home to her and she will die.  He has been feeling not his normal self for the past few days.  He has a headache and some heart palpitations.  No cp or sob.  No f/c.     Past Medical History:  Diagnosis Date  . Hyperlipidemia   . Hypertension   . Pre-diabetes    A1c 6%    Patient Active Problem List   Diagnosis Date Noted  . Snoring 07/04/2018  . Colon cancer screening 07/04/2018  . Primary osteoarthritis involving multiple joints 08/16/2017  . Pure hyperglyceridemia 05/10/2017  . Microalbuminuria 05/10/2017  . Bilateral carotid bruits 05/10/2017  . Diabetes mellitus with proteinuric diabetic nephropathy (HCC) 05/10/2017  . Obesity 08/11/2016  . Routine general medical examination at a health care facility 06/24/2015  . COLONIC POLYPS, HX OF 04/21/2010  . Hyperlipidemia LDL goal <130 05/29/2007  . ERECTILE DYSFUNCTION 01/16/2007  . Essential hypertension 01/16/2007  . HYPERPLASIA, PRST NOS W/O URINARY OBST/LUTS 01/16/2007    Past Surgical History:  Procedure Laterality Date  . colonoscopy with polypectomy  2009   Dr Leafy [facility]  . TONSILLECTOMY AND ADENOIDECTOMY    . WISDOM TOOTH EXTRACTION     two extraction        Home Medications    Prior to Admission medications   Medication Sig Start Date End Date Taking? Authorizing Provider  amLODipine (NORVASC) 10 MG tablet Take 1 tablet (10 mg total) by mouth daily. 02/10/19   Jacalyn Lefevre, MD  aspirin 81 MG tablet  Take 81 mg by mouth daily.      [provider]  Cholecalciferol 1.25 MG (50000 UT) capsule Take 1 capsule (50,000 Units total) by mouth once a week. 07/04/18   Etta Grandchild, MD  doxazosin (CARDURA) 2 MG tablet Take 1 tablet by mouth once daily 12/06/18   Etta Grandchild, MD  irbesartan (AVAPRO) 300 MG tablet Take 1 tablet (300 mg total) by mouth daily. 02/10/19   Jacalyn Lefevre, MD  metFORMIN (GLUCOPHAGE) 1000 MG tablet Take 1 tablet (1,000 mg total) by mouth 2 (two) times daily with a meal. 07/15/18   Etta Grandchild, MD  metoprolol succinate (TOPROL-XL) 25 MG 24 hr tablet Take 1 tablet (25 mg total) by mouth daily. 02/10/19   Jacalyn Lefevre, MD  omega-3 acid ethyl esters (LOVAZA) 1 g capsule Take 2 capsules (2 g total) by mouth 2 (two) times daily. 07/09/18   Etta Grandchild, MD  potassium chloride (K-DUR) 10 MEQ tablet Take 1 tablet by mouth once daily 12/06/18   Etta Grandchild, MD  rosuvastatin (CRESTOR) 10 MG tablet Take 1 tablet (10 mg total) by mouth daily. 02/10/19   Jacalyn Lefevre, MD  spironolactone (ALDACTONE) 25 MG tablet Take 1 tablet (25 mg total) by mouth daily. 02/10/19   Jacalyn Lefevre, MD  tadalafil (CIALIS) 20 MG tablet TAKE ONE TABLET BY MOUTH EVERY  3 DAYS AS NEEDED Patient not taking: Reported on 07/04/2018 09/03/15   Golden Circle, FNP    Family History Family History  Problem Relation Age of Onset  . Heart attack Father 63  . Diabetes Mother   . Heart attack Paternal Grandfather        ? 28  . Cancer Neg Hx   . Stroke Neg Hx     Social History Social History   Tobacco Use  . Smoking status: Current Every Day Smoker    Packs/day: 0.25    Years: 41.00    Pack years: 10.25    Types: Cigarettes    Last attempt to quit: 04/18/1991    Years since quitting: 27.8  . Smokeless tobacco: Never Used  . Tobacco comment: smoked 1972-1993, up to 1/2 ppd. As of 02/24/15 5 cig/day  Substance Use Topics  . Alcohol use: Not Currently    Comment:  1 mixed  drink/week  . Drug use: No     Allergies   Patient has no known allergies.   Review of Systems Review of Systems  Neurological: Positive for headaches.  All other systems reviewed and are negative.    Physical Exam Updated Vital Signs BP (!) 187/113   Pulse 86   Temp 98.1 F (36.7 C)   Resp (!) 26   Ht 5\' 9"  (1.753 m)   Wt 104.3 kg   SpO2 96%   BMI 33.97 kg/m   Physical Exam Vitals signs and nursing note reviewed.  Constitutional:      Appearance: Normal appearance. He is obese.  HENT:     Head: Normocephalic and atraumatic.     Right Ear: External ear normal.     Left Ear: External ear normal.     Nose: Nose normal.     Mouth/Throat:     Mouth: Mucous membranes are moist.     Pharynx: Oropharynx is clear.  Eyes:     Extraocular Movements: Extraocular movements intact.     Conjunctiva/sclera: Conjunctivae normal.     Pupils: Pupils are equal, round, and reactive to light.  Neck:     Musculoskeletal: Normal range of motion and neck supple.  Cardiovascular:     Rate and Rhythm: Normal rate and regular rhythm.     Pulses: Normal pulses.     Heart sounds: Normal heart sounds.  Pulmonary:     Effort: Pulmonary effort is normal.     Breath sounds: Normal breath sounds.  Abdominal:     General: Abdomen is flat. Bowel sounds are normal.     Palpations: Abdomen is soft.  Musculoskeletal: Normal range of motion.  Skin:    General: Skin is warm.     Capillary Refill: Capillary refill takes less than 2 seconds.  Neurological:     General: No focal deficit present.     Mental Status: He is alert and oriented to person, place, and time.  Psychiatric:        Mood and Affect: Mood normal.        Behavior: Behavior normal.        Thought Content: Thought content normal.        Judgment: Judgment normal.      ED Treatments / Results  Labs (all labs ordered are listed, but only abnormal results are displayed) Labs Reviewed  COMPREHENSIVE METABOLIC PANEL -  Abnormal; Notable for the following components:      Result Value   Glucose, Bld 122 (*)    Creatinine,  Ser 1.28 (*)    Calcium 8.8 (*)    GFR calc non Af Amer 59 (*)    All other components within normal limits  CBG MONITORING, ED - Abnormal; Notable for the following components:   Glucose-Capillary 118 (*)    All other components within normal limits  TROPONIN I (HIGH SENSITIVITY) - Abnormal; Notable for the following components:   Troponin I (High Sensitivity) 26 (*)    All other components within normal limits  TROPONIN I (HIGH SENSITIVITY) - Abnormal; Notable for the following components:   Troponin I (High Sensitivity) 26 (*)    All other components within normal limits  CBC  MAGNESIUM  TROPONIN I (HIGH SENSITIVITY)    EKG EKG Interpretation  Date/Time:  Monday February 10 2019 09:23:01 EDT Ventricular Rate:  90 PR Interval:    QRS Duration: 113 QT Interval:  400 QTC Calculation: 490 R Axis:   -84 Text Interpretation:  Sinus rhythm Incomplete RBBB and LAFB Abnormal R-wave progression, late transition Borderline prolonged QT interval No significant change since last tracing Confirmed by Jacalyn Lefevre [phone]) on 02/10/2019 9:26:25 AM   Radiology Dg Chest 2 View  Result Date: 02/10/2019 CLINICAL DATA:  Hypertension. EXAM: CHEST - 2 VIEW COMPARISON:  None. FINDINGS: The heart size and mediastinal contours are within normal limits. Both lungs are clear. The visualized skeletal structures are unremarkable. IMPRESSION: No active cardiopulmonary disease. Electronically Signed   By: Lupita Raider M.D.   On: 02/10/2019 09:31    Procedures Procedures (including critical care time)  Medications Ordered in ED Medications  metoprolol succinate (TOPROL-XL) 24 hr tablet 25 mg (25 mg Oral Given 02/10/19 0933)  amLODipine (NORVASC) tablet 10 mg (10 mg Oral Given 02/10/19 0931)     Initial Impression / Assessment and Plan / ED Course  I have reviewed the triage vital signs  and the nursing notes.  Pertinent labs & imaging results that were available during my care of the patient were reviewed by me and considered in my medical decision making (see chart for details).    BP still remains high, but it's been high for over a month.  I worry if I bring it down too quickly, it will make him feel worse.  Pt's bp meds are refilled for 1 month.  Troponin is 26 and is unchanged at 2 hrs.  Pt denies any cp.  Cr also slightly elevated.  This is likely from untreated HTN.  Pt given the DASH eating plan.  Pt is encouraged to f/u with his pcp.    Final Clinical Impressions(s) / ED Diagnoses   Final diagnoses:  Essential hypertension  Medication refill    ED Discharge Orders         Ordered    amLODipine (NORVASC) 10 MG tablet  Daily     02/10/19 1243    irbesartan (AVAPRO) 300 MG tablet  Daily     02/10/19 1243    metoprolol succinate (TOPROL-XL) 25 MG 24 hr tablet  Daily     02/10/19 1243    rosuvastatin (CRESTOR) 10 MG tablet  Daily     02/10/19 1243    spironolactone (ALDACTONE) 25 MG tablet  Daily     02/10/19 1243           Jacalyn Lefevre, MD 02/10/19 1246

## 2019-02-10 NOTE — ED Triage Notes (Addendum)
Pt states he is out of his BP medication for one month.  Pt states he needs medication for BP.  He says his eyes hurt some.  Denies dizziness or blurred vision.  Slight headache.

## 2019-02-12 ENCOUNTER — Other Ambulatory Visit: Payer: Self-pay

## 2019-02-12 ENCOUNTER — Other Ambulatory Visit: Payer: Self-pay | Admitting: Internal Medicine

## 2019-02-12 ENCOUNTER — Other Ambulatory Visit (INDEPENDENT_AMBULATORY_CARE_PROVIDER_SITE_OTHER): Payer: BC Managed Care – PPO

## 2019-02-12 ENCOUNTER — Ambulatory Visit (INDEPENDENT_AMBULATORY_CARE_PROVIDER_SITE_OTHER): Payer: BC Managed Care – PPO | Admitting: Internal Medicine

## 2019-02-12 ENCOUNTER — Encounter: Payer: Self-pay | Admitting: Internal Medicine

## 2019-02-12 ENCOUNTER — Telehealth: Payer: Self-pay

## 2019-02-12 VITALS — BP 176/104 | HR 93 | Temp 98.5°F | Ht 69.0 in | Wt 232.8 lb

## 2019-02-12 DIAGNOSIS — E781 Pure hyperglyceridemia: Secondary | ICD-10-CM

## 2019-02-12 DIAGNOSIS — Z23 Encounter for immunization: Secondary | ICD-10-CM

## 2019-02-12 DIAGNOSIS — I10 Essential (primary) hypertension: Secondary | ICD-10-CM

## 2019-02-12 DIAGNOSIS — R9431 Abnormal electrocardiogram [ECG] [EKG]: Secondary | ICD-10-CM

## 2019-02-12 DIAGNOSIS — R072 Precordial pain: Secondary | ICD-10-CM | POA: Insufficient documentation

## 2019-02-12 DIAGNOSIS — I119 Hypertensive heart disease without heart failure: Secondary | ICD-10-CM

## 2019-02-12 DIAGNOSIS — E1121 Type 2 diabetes mellitus with diabetic nephropathy: Secondary | ICD-10-CM

## 2019-02-12 LAB — TRIGLYCERIDES: Triglycerides: 583 mg/dL — ABNORMAL HIGH (ref 0.0–149.0)

## 2019-02-12 MED ORDER — POTASSIUM CHLORIDE ER 10 MEQ PO TBCR: 10.0000 meq | EXTENDED_RELEASE_TABLET | Freq: Every day | ORAL | 1 refills | Status: DC

## 2019-02-12 MED ORDER — AMLODIPINE BESYLATE 10 MG PO TABS: 10.0000 mg | ORAL_TABLET | Freq: Every day | ORAL | 1 refills | Status: DC

## 2019-02-12 MED ORDER — DOXAZOSIN MESYLATE 2 MG PO TABS: 2.0000 mg | ORAL_TABLET | Freq: Every day | ORAL | 1 refills | Status: DC

## 2019-02-12 MED ORDER — NEBIVOLOL HCL 10 MG PO TABS: 10.0000 mg | ORAL_TABLET | Freq: Every day | ORAL | 0 refills | Status: DC

## 2019-02-12 MED ORDER — OMEGA-3-ACID ETHYL ESTERS 1 G PO CAPS: 2.0000 g | ORAL_CAPSULE | Freq: Two times a day (BID) | ORAL | 1 refills | Status: DC

## 2019-02-12 MED ORDER — FARXIGA 5 MG PO TABS: 5.0000 mg | ORAL_TABLET | Freq: Every day | ORAL | 0 refills | Status: DC

## 2019-02-12 NOTE — Telephone Encounter (Signed)
Key: [id number]   PA approved.

## 2019-02-12 NOTE — Progress Notes (Signed)
Subjective:  Patient ID: Tim Decker, male    DOB: January 25, 1956  Age: 63 y.o. MRN: [id number]  CC: Hypertension, Diabetes, and Hyperlipidemia   HPI Bulmaro Feagans presents for f/up - He was recently seen in the ED concerned about an elevated blood pressure.  He recently ran out of his antihypertensives.  He admits to having an uncomfortable sensation in his chest for the last few weeks.  He initially described it as a fluttering sensation but now he says it is a discomfort.  He cannot describe it in any better detail.  He says he notices the pain at rest.  He denies diaphoresis, DOE, dizziness, or lightheadedness.  Outpatient Medications Prior to Visit  Medication Sig Dispense Refill   aspirin 81 MG tablet Take 81 mg by mouth daily.       Cholecalciferol 1.25 MG (50000 UT) capsule Take 1 capsule (50,000 Units total) by mouth once a week. 12 capsule 0   irbesartan (AVAPRO) 300 MG tablet Take 1 tablet (300 mg total) by mouth daily. 30 tablet 0   rosuvastatin (CRESTOR) 10 MG tablet Take 1 tablet (10 mg total) by mouth daily. 30 tablet 0   spironolactone (ALDACTONE) 25 MG tablet Take 1 tablet (25 mg total) by mouth daily. 30 tablet 0   tadalafil (CIALIS) 20 MG tablet TAKE ONE TABLET BY MOUTH EVERY 3 DAYS AS NEEDED 6 tablet 2   amLODipine (NORVASC) 10 MG tablet Take 1 tablet (10 mg total) by mouth daily. 30 tablet 0   doxazosin (CARDURA) 2 MG tablet Take 1 tablet by mouth once daily 30 tablet 0   metFORMIN (GLUCOPHAGE) 1000 MG tablet Take 1 tablet (1,000 mg total) by mouth 2 (two) times daily with a meal. 180 tablet 1   metoprolol succinate (TOPROL-XL) 25 MG 24 hr tablet Take 1 tablet (25 mg total) by mouth daily. 30 tablet 0   omega-3 acid ethyl esters (LOVAZA) 1 g capsule Take 2 capsules (2 g total) by mouth 2 (two) times daily. 360 capsule 1   potassium chloride (K-DUR) 10 MEQ tablet Take 1 tablet by mouth once daily 30 tablet 0   No facility-administered medications  prior to visit.     ROS Review of Systems  Constitutional: Negative for diaphoresis, fatigue and unexpected weight change.  HENT: Negative.   Eyes: Negative.   Respiratory: Negative.  Negative for cough, chest tightness, shortness of breath and wheezing.   Cardiovascular: Positive for chest pain. Negative for palpitations and leg swelling.  Gastrointestinal: Negative for abdominal pain, blood in stool, constipation, diarrhea, nausea and vomiting.  Endocrine: Negative.  Negative for polydipsia, polyphagia and polyuria.  Genitourinary: Negative.  Negative for difficulty urinating.  Musculoskeletal: Negative.  Negative for arthralgias and myalgias.  Skin: Negative.  Negative for color change and pallor.  Neurological: Negative.  Negative for dizziness, weakness and light-headedness.  Hematological: Negative for adenopathy. Does not bruise/bleed easily.  Psychiatric/Behavioral: Negative.     Objective:  BP (!) 176/104 (BP Location: Left Arm, Patient Position: Sitting, Cuff Size: Large)    Pulse 93    Temp 98.5 F (36.9 C) (Oral)    Ht 5\' 9"  (1.753 m)    Wt 232 lb 12 oz (105.6 kg)    SpO2 94%    BMI 34.37 kg/m   BP Readings from Last 3 Encounters:  02/12/19 (!) 176/104  02/10/19 (!) 187/113  07/04/18 (!) 142/82    Wt Readings from Last 3 Encounters:  02/12/19 232 lb 12 oz (  105.6 kg)  02/10/19 230 lb (104.3 kg)  07/04/18 246 lb 4 oz (111.7 kg)    Physical Exam Constitutional:      Appearance: Normal appearance. He is obese.  HENT:     Nose: Nose normal.     Mouth/Throat:     Mouth: Mucous membranes are moist.  Eyes:     General: No scleral icterus.    Conjunctiva/sclera: Conjunctivae normal.  Neck:     Musculoskeletal: Neck supple.  Cardiovascular:     Rate and Rhythm: Normal rate and regular rhythm.     Heart sounds: No murmur.  Pulmonary:     Effort: Pulmonary effort is normal.     Breath sounds: Normal breath sounds.  Chest:     Comments: EKG ----  Sinus  Rhythm   Low voltage in limb leads.   -Left axis -anterior fascicular block.   ABNORMAL  Abdominal:     General: Abdomen is protuberant. Bowel sounds are normal. There is no distension.     Palpations: There is no hepatomegaly or splenomegaly.  Musculoskeletal: Normal range of motion.     Right lower leg: No edema.     Left lower leg: No edema.  Lymphadenopathy:     Cervical: No cervical adenopathy.  Skin:    General: Skin is warm and dry.  Neurological:     General: No focal deficit present.     Mental Status: He is alert.  Psychiatric:        Mood and Affect: Mood normal.        Behavior: Behavior normal.     Lab Results  Component Value Date   WBC 7.4 02/10/2019   HGB 16.0 02/10/2019   HCT 48.3 02/10/2019   PLT 341 02/10/2019   GLUCOSE 122 (H) 02/10/2019   CHOL 137 07/04/2018   TRIG (H) 02/12/2019    583.0 Triglyceride is over 400; calculations on Lipids are invalid.   HDL 25.40 (L) 07/04/2018   LDLDIRECT 49.0 07/04/2018   LDLCALC 93 01/15/2014   ALT 29 02/10/2019   AST 24 02/10/2019   NA 138 02/10/2019   K 3.5 02/10/2019   CL 102 02/10/2019   CREATININE 1.28 (H) 02/10/2019   BUN 15 02/10/2019   CO2 27 02/10/2019   TSH 2.20 07/04/2018   PSA 0.97 07/04/2018   HGBA1C 5.9 02/12/2019   MICROALBUR 90.1 (H) 07/04/2018    Dg Chest 2 View  Result Date: 02/10/2019 CLINICAL DATA:  Hypertension. EXAM: CHEST - 2 VIEW COMPARISON:  None. FINDINGS: The heart size and mediastinal contours are within normal limits. Both lungs are clear. The visualized skeletal structures are unremarkable. IMPRESSION: No active cardiopulmonary disease. Electronically Signed   By: Lupita RaiderJames  Green Jr M.D.   On: 02/10/2019 09:31    Assessment & Plan:   Jonny RuizJohn was seen today for hypertension, diabetes and hyperlipidemia.  Diagnoses and all orders for this visit:  Need for influenza vaccination -     Flu Vaccine QUAD 36+ mos IM  Diabetes mellitus with proteinuric diabetic nephropathy (HCC)- His blood  sugar is adequately well controlled.  I have asked him to add an SGLT2 inhibitor for cardiovascular risk reduction and I also hope that this will help him achieve his blood pressure goal of less than 130/80. -     Hemoglobin A1c; Future -     dapagliflozin propanediol (FARXIGA) 5 MG TABS tablet; Take 5 mg by mouth daily before breakfast. -     metFORMIN (GLUCOPHAGE) 1000 MG tablet; Take  1 tablet (1,000 mg total) by mouth 2 (two) times daily with a meal.  Essential hypertension- His blood pressure is not adequately well controlled due to noncompliance.  In the ED he was placed on metoprolol.  I have asked him to upgrade to carvedilol since this is a better antihypertensive.  Will also restart doxazosin, amlodipine, and spironolactone. -     EKG 12-Lead -     Discontinue: nebivolol (BYSTOLIC) 10 MG tablet; Take 1 tablet (10 mg total) by mouth daily. -     potassium chloride (KLOR-CON) 10 MEQ tablet; Take 1 tablet (10 mEq total) by mouth daily. -     doxazosin (CARDURA) 2 MG tablet; Take 1 tablet (2 mg total) by mouth daily. -     amLODipine (NORVASC) 10 MG tablet; Take 1 tablet (10 mg total) by mouth daily. -     carvedilol (COREG) 6.25 MG tablet; Take 1 tablet (6.25 mg total) by mouth 2 (two) times daily with a meal.  Pure hyperglyceridemia- His triglycerides remain high.  I have asked him to be compliant with the omega-3 fish oil supplement and to improve his lifestyle modifications. -     Triglycerides; Future -     omega-3 acid ethyl esters (LOVAZA) 1 g capsule; Take 2 capsules (2 g total) by mouth 2 (two) times daily.  Precordial pain- He has a vague precordial chest discomfort.  His EKG is negative for ischemia but is otherwise uninterpretable.  His troponin is negative.  He is high risk for CAD so I have asked him to undergo a myocardial perfusion imaging. -     Troponin I (High Sensitivity); Future -     Cancel: MYOCARDIAL PERFUSION IMAGING; Future -     Cancel: MYOCARDIAL PERFUSION  IMAGING; Future -     MYOCARDIAL PERFUSION IMAGING; Future  Abnormal electrocardiogram (ECG) (EKG)- See above. -     Cancel: MYOCARDIAL PERFUSION IMAGING; Future -     Cancel: MYOCARDIAL PERFUSION IMAGING; Future -     MYOCARDIAL PERFUSION IMAGING; Future   I have discontinued Trentin T. Brougher's metoprolol succinate and nebivolol. I have also changed his potassium chloride and doxazosin. Additionally, I am having him start on Farxiga and carvedilol. Lastly, I am having him maintain his aspirin, tadalafil, Cholecalciferol, irbesartan, rosuvastatin, spironolactone, amLODipine, omega-3 acid ethyl esters, and metFORMIN.  Meds ordered this encounter  Medications   dapagliflozin propanediol (FARXIGA) 5 MG TABS tablet    Sig: Take 5 mg by mouth daily before breakfast.    Dispense:  84 tablet    Refill:  0   DISCONTD: nebivolol (BYSTOLIC) 10 MG tablet    Sig: Take 1 tablet (10 mg total) by mouth daily.    Dispense:  90 tablet    Refill:  0   potassium chloride (KLOR-CON) 10 MEQ tablet    Sig: Take 1 tablet (10 mEq total) by mouth daily.    Dispense:  90 tablet    Refill:  1   doxazosin (CARDURA) 2 MG tablet    Sig: Take 1 tablet (2 mg total) by mouth daily.    Dispense:  90 tablet    Refill:  1   amLODipine (NORVASC) 10 MG tablet    Sig: Take 1 tablet (10 mg total) by mouth daily.    Dispense:  90 tablet    Refill:  1   omega-3 acid ethyl esters (LOVAZA) 1 g capsule    Sig: Take 2 capsules (2 g total) by mouth 2 (two) times  daily.    Dispense:  360 capsule    Refill:  1   carvedilol (COREG) 6.25 MG tablet    Sig: Take 1 tablet (6.25 mg total) by mouth 2 (two) times daily with a meal.    Dispense:  180 tablet    Refill:  0   metFORMIN (GLUCOPHAGE) 1000 MG tablet    Sig: Take 1 tablet (1,000 mg total) by mouth 2 (two) times daily with a meal.    Dispense:  180 tablet    Refill:  0     Follow-up: Return in about 6 weeks (around 03/26/2019).  Sanda Linger, MD

## 2019-02-12 NOTE — Patient Instructions (Signed)

## 2019-02-13 ENCOUNTER — Encounter: Payer: Self-pay | Admitting: Internal Medicine

## 2019-02-13 LAB — HEMOGLOBIN A1C: Hgb A1c MFr Bld: 5.9 % (ref 4.6–6.5)

## 2019-02-13 LAB — TROPONIN I: Troponin I: 0.03 ng/mL (ref <=0.0)

## 2019-02-13 MED ORDER — CARVEDILOL 6.25 MG PO TABS: 6.2500 mg | ORAL_TABLET | Freq: Two times a day (BID) | ORAL | 0 refills | Status: DC

## 2019-02-13 MED ORDER — METFORMIN HCL 1000 MG PO TABS: 1000.0000 mg | ORAL_TABLET | Freq: Two times a day (BID) | ORAL | 0 refills | Status: DC

## 2019-02-13 MED FILL — CARVEDILOL 6.25 MG TABLET: 6.25 | 90 days supply | Qty: 180 | Fill #0

## 2019-02-17 ENCOUNTER — Telehealth (HOSPITAL_COMMUNITY): Payer: Self-pay | Admitting: *Deleted

## 2019-02-17 NOTE — Telephone Encounter (Signed)
Patient given detailed instructions per Myocardial Perfusion Study Information Sheet for the test on 04/30/18. Patient notified to arrive 15 minutes early and that it is imperative to arrive on time for appointment to keep from having the test rescheduled.  If you need to cancel or reschedule your appointment, please call the office within 24 hours of your appointment. . Patient verbalized understanding. Tim Decker   

## 2019-02-19 ENCOUNTER — Ambulatory Visit (HOSPITAL_COMMUNITY): Payer: BC Managed Care – PPO | Attending: Cardiovascular Disease

## 2019-02-19 ENCOUNTER — Other Ambulatory Visit: Payer: Self-pay

## 2019-02-19 DIAGNOSIS — R072 Precordial pain: Secondary | ICD-10-CM | POA: Diagnosis not present

## 2019-02-19 DIAGNOSIS — R9431 Abnormal electrocardiogram [ECG] [EKG]: Secondary | ICD-10-CM | POA: Diagnosis not present

## 2019-02-19 LAB — MYOCARDIAL PERFUSION IMAGING
LV dias vol: 117 mL (ref 62–150)
LV sys vol: 66 mL
Peak HR: 86 {beats}/min
Rest HR: 71 {beats}/min
SDS: 1
SRS: 0
SSS: 1
TID: 1.13

## 2019-02-19 MED ORDER — TECHNETIUM TC 99M TETROFOSMIN IV KIT
30.9000 | PACK | Freq: Once | INTRAVENOUS | Status: AC | PRN
  Administered 2019-02-19: 30.9 via INTRAVENOUS
  Filled 2019-02-19: qty 31

## 2019-02-19 MED ORDER — TECHNETIUM TC 99M TETROFOSMIN IV KIT
10.7000 | PACK | Freq: Once | INTRAVENOUS | Status: AC | PRN
  Administered 2019-02-19: 10.7 via INTRAVENOUS
  Filled 2019-02-19: qty 11

## 2019-02-19 MED ORDER — REGADENOSON 0.4 MG/5ML IV SOLN
0.4000 mg | Freq: Once | INTRAVENOUS | Status: AC
  Administered 2019-02-19: 0.4 mg via INTRAVENOUS

## 2019-02-20 ENCOUNTER — Other Ambulatory Visit: Payer: Self-pay | Admitting: Internal Medicine

## 2019-02-20 ENCOUNTER — Encounter: Payer: Self-pay | Admitting: Internal Medicine

## 2019-02-20 DIAGNOSIS — R9430 Abnormal result of cardiovascular function study, unspecified: Secondary | ICD-10-CM

## 2019-02-26 ENCOUNTER — Other Ambulatory Visit: Payer: Self-pay

## 2019-02-26 ENCOUNTER — Ambulatory Visit (HOSPITAL_COMMUNITY): Payer: BC Managed Care – PPO | Attending: Cardiovascular Disease

## 2019-02-26 DIAGNOSIS — R9430 Abnormal result of cardiovascular function study, unspecified: Secondary | ICD-10-CM | POA: Insufficient documentation

## 2019-02-27 ENCOUNTER — Encounter: Payer: Self-pay | Admitting: Internal Medicine

## 2019-02-27 DIAGNOSIS — I119 Hypertensive heart disease without heart failure: Secondary | ICD-10-CM | POA: Insufficient documentation

## 2019-03-02 ENCOUNTER — Other Ambulatory Visit: Payer: Self-pay | Admitting: Internal Medicine

## 2019-03-02 DIAGNOSIS — E559 Vitamin D deficiency, unspecified: Secondary | ICD-10-CM

## 2019-03-02 DIAGNOSIS — I10 Essential (primary) hypertension: Secondary | ICD-10-CM

## 2019-03-03 ENCOUNTER — Other Ambulatory Visit: Payer: Self-pay | Admitting: Internal Medicine

## 2019-03-03 DIAGNOSIS — E559 Vitamin D deficiency, unspecified: Secondary | ICD-10-CM

## 2019-03-03 MED ORDER — CHOLECALCIFEROL 1.25 MG (50000 UT) PO CAPS: 50000.0000 [IU] | ORAL_CAPSULE | ORAL | 0 refills | Status: DC

## 2019-03-03 MED FILL — VIT D3-50 50,000 UNITS CAPS: 1.25 MG | 84 days supply | Qty: 12 | Fill #0

## 2019-03-05 DIAGNOSIS — I10 Essential (primary) hypertension: Secondary | ICD-10-CM

## 2019-03-05 DIAGNOSIS — E876 Hypokalemia: Secondary | ICD-10-CM

## 2019-03-05 DIAGNOSIS — E785 Hyperlipidemia, unspecified: Secondary | ICD-10-CM

## 2019-03-06 MED ORDER — IRBESARTAN 300 MG PO TABS: 300.0000 mg | ORAL_TABLET | Freq: Every day | ORAL | 1 refills | Status: DC

## 2019-03-06 MED ORDER — ROSUVASTATIN CALCIUM 10 MG PO TABS: 10.0000 mg | ORAL_TABLET | Freq: Every day | ORAL | 1 refills | Status: DC

## 2019-03-07 ENCOUNTER — Other Ambulatory Visit: Payer: Self-pay | Admitting: Internal Medicine

## 2019-03-07 DIAGNOSIS — I10 Essential (primary) hypertension: Secondary | ICD-10-CM

## 2019-03-07 DIAGNOSIS — E559 Vitamin D deficiency, unspecified: Secondary | ICD-10-CM

## 2019-03-10 MED ORDER — SPIRONOLACTONE 25 MG PO TABS: 25.0000 mg | ORAL_TABLET | Freq: Every day | ORAL | 0 refills | Status: DC

## 2019-03-10 MED ORDER — AMLODIPINE BESYLATE 10 MG PO TABS: 10.0000 mg | ORAL_TABLET | Freq: Every day | ORAL | 1 refills | Status: DC

## 2019-03-26 ENCOUNTER — Other Ambulatory Visit (INDEPENDENT_AMBULATORY_CARE_PROVIDER_SITE_OTHER): Payer: BC Managed Care – PPO

## 2019-03-26 ENCOUNTER — Encounter: Payer: Self-pay | Admitting: Internal Medicine

## 2019-03-26 ENCOUNTER — Other Ambulatory Visit: Payer: Self-pay

## 2019-03-26 ENCOUNTER — Ambulatory Visit (INDEPENDENT_AMBULATORY_CARE_PROVIDER_SITE_OTHER): Payer: BC Managed Care – PPO | Admitting: Internal Medicine

## 2019-03-26 VITALS — BP 122/76 | HR 72 | Temp 98.2°F | Resp 16 | Ht 69.0 in | Wt 236.0 lb

## 2019-03-26 DIAGNOSIS — E781 Pure hyperglyceridemia: Secondary | ICD-10-CM

## 2019-03-26 DIAGNOSIS — I10 Essential (primary) hypertension: Secondary | ICD-10-CM

## 2019-03-26 DIAGNOSIS — K219 Gastro-esophageal reflux disease without esophagitis: Secondary | ICD-10-CM | POA: Diagnosis not present

## 2019-03-26 DIAGNOSIS — I119 Hypertensive heart disease without heart failure: Secondary | ICD-10-CM

## 2019-03-26 LAB — BASIC METABOLIC PANEL
BUN: 15 mg/dL (ref 6–23)
CO2: 27 mEq/L (ref 19–32)
Calcium: 9.3 mg/dL (ref 8.4–10.5)
Chloride: 106 mEq/L (ref 96–112)
Creatinine, Ser: 1.51 mg/dL — ABNORMAL HIGH (ref 0.40–1.50)
GFR: 46.75 mL/min — ABNORMAL LOW (ref >60.00)
Glucose, Bld: 134 mg/dL — ABNORMAL HIGH (ref 70–99)
Potassium: 3.9 mEq/L (ref 3.5–5.1)
Sodium: 139 mEq/L (ref 135–145)

## 2019-03-26 LAB — TRIGLYCERIDES: Triglycerides: 343 mg/dL — ABNORMAL HIGH (ref 0.0–149.0)

## 2019-03-26 MED ORDER — DEXILANT 60 MG PO CPDR: 60.0000 mg | DELAYED_RELEASE_CAPSULE | Freq: Every day | ORAL | 1 refills | Status: DC

## 2019-03-26 NOTE — Patient Instructions (Signed)

## 2019-03-26 NOTE — Progress Notes (Signed)
Subjective:  Patient ID: Tim Decker, male    DOB: September 20, 1955  Age: 63 y.o. MRN: [id number]  CC: Hypertension and Hyperlipidemia  This visit occurred during the SARS-CoV-2 public health emergency.  Safety protocols were in place, including screening questions prior to the visit, additional usage of staff PPE, and extensive cleaning of exam room while observing appropriate contact time as indicated for disinfecting solutions.   HPI Tim Decker presents for f/up - He complains of heartburn.  He is not getting much symptom relief with over-the-counter options.  He denies odynophagia or dysphagia.  He complains of weight gain.  He tells me his blood pressure has been well controlled.  He denies any recent episodes of dizziness, lightheadedness, chest pain, or shortness of breath.  Outpatient Medications Prior to Visit  Medication Sig Dispense Refill  . amLODipine (NORVASC) 10 MG tablet Take 1 tablet (10 mg total) by mouth daily. 90 tablet 1  . aspirin 81 MG tablet Take 81 mg by mouth daily.      . carvedilol (COREG) 6.25 MG tablet Take 1 tablet (6.25 mg total) by mouth 2 (two) times daily with a meal. 180 tablet 0  . Cholecalciferol 1.25 MG (50000 UT) capsule Take 1 capsule (50,000 Units total) by mouth once a week. 12 capsule 0  . dapagliflozin propanediol (FARXIGA) 5 MG TABS tablet Take 5 mg by mouth daily before breakfast. 84 tablet 0  . doxazosin (CARDURA) 2 MG tablet Take 1 tablet (2 mg total) by mouth daily. 90 tablet 1  . irbesartan (AVAPRO) 300 MG tablet Take 1 tablet (300 mg total) by mouth daily. 90 tablet 1  . metFORMIN (GLUCOPHAGE) 1000 MG tablet Take 1 tablet (1,000 mg total) by mouth 2 (two) times daily with a meal. 180 tablet 0  . omega-3 acid ethyl esters (LOVAZA) 1 g capsule Take 2 capsules (2 g total) by mouth 2 (two) times daily. 360 capsule 1  . potassium chloride (KLOR-CON) 10 MEQ tablet Take 1 tablet (10 mEq total) by mouth daily. 90 tablet 1  . rosuvastatin  (CRESTOR) 10 MG tablet Take 1 tablet (10 mg total) by mouth daily. 90 tablet 1  . tadalafil (CIALIS) 20 MG tablet TAKE ONE TABLET BY MOUTH EVERY 3 DAYS AS NEEDED 6 tablet 2  . spironolactone (ALDACTONE) 25 MG tablet Take 1 tablet (25 mg total) by mouth daily. 90 tablet 0   No facility-administered medications prior to visit.     ROS Review of Systems  Constitutional: Positive for unexpected weight change (wt gain). Negative for diaphoresis and fatigue.  HENT: Negative.   Eyes: Negative.   Respiratory: Negative for cough, chest tightness, shortness of breath and wheezing.   Cardiovascular: Positive for leg swelling. Negative for chest pain and palpitations.  Gastrointestinal: Negative for abdominal pain, diarrhea, nausea and vomiting.  Endocrine: Negative.   Genitourinary: Negative.  Negative for difficulty urinating.  Musculoskeletal: Negative.  Negative for arthralgias and myalgias.  Skin: Negative.   Neurological: Negative.  Negative for dizziness, weakness and light-headedness.  Hematological: Negative.   Psychiatric/Behavioral: Negative.     Objective:  BP 122/76 (BP Location: Left Arm, Patient Position: Sitting, Cuff Size: Normal)   Pulse 72   Temp 98.2 F (36.8 C) (Oral)   Resp 16   Ht 5\' 9"  (1.753 m)   Wt 236 lb (107 kg)   SpO2 96%   BMI 34.85 kg/m   BP Readings from Last 3 Encounters:  03/26/19 122/76  02/12/19 (!) 176/104  02/10/19 (!) 187/113    Wt Readings from Last 3 Encounters:  03/26/19 236 lb (107 kg)  02/19/19 232 lb (105.2 kg)  02/12/19 232 lb 12 oz (105.6 kg)    Physical Exam Vitals reviewed.  Constitutional:      Appearance: He is obese.  HENT:     Nose: Nose normal.  Eyes:     General: No scleral icterus.    Conjunctiva/sclera: Conjunctivae normal.  Cardiovascular:     Rate and Rhythm: Normal rate and regular rhythm.     Heart sounds: No murmur.  Pulmonary:     Effort: Pulmonary effort is normal.     Breath sounds: No stridor. No  wheezing, rhonchi or rales.  Abdominal:     General: Abdomen is protuberant. Bowel sounds are normal.     Palpations: There is no hepatomegaly, splenomegaly or mass.     Tenderness: There is no abdominal tenderness.  Musculoskeletal:     Cervical back: Neck supple.     Right lower leg: 1+ Pitting Edema present.     Left lower leg: 1+ Pitting Edema present.  Lymphadenopathy:     Cervical: No cervical adenopathy.  Skin:    General: Skin is warm.  Neurological:     General: No focal deficit present.     Mental Status: He is alert.  Psychiatric:        Mood and Affect: Mood normal.        Behavior: Behavior normal.     Lab Results  Component Value Date   WBC 7.4 02/10/2019   HGB 16.0 02/10/2019   HCT 48.3 02/10/2019   PLT 341 02/10/2019   GLUCOSE 134 (H) 03/26/2019   CHOL 137 07/04/2018   TRIG 343.0 (H) 03/26/2019   HDL 25.40 (L) 07/04/2018   LDLDIRECT 49.0 07/04/2018   LDLCALC 93 01/15/2014   ALT 29 02/10/2019   AST 24 02/10/2019   NA 139 03/26/2019   K 3.9 03/26/2019   CL 106 03/26/2019   CREATININE 1.51 (H) 03/26/2019   BUN 15 03/26/2019   CO2 27 03/26/2019   TSH 2.20 07/04/2018   PSA 0.97 07/04/2018   HGBA1C 5.9 02/12/2019   MICROALBUR 90.1 (H) 07/04/2018    Dg Chest 2 View  Result Date: 02/10/2019 CLINICAL DATA:  Hypertension. EXAM: CHEST - 2 VIEW COMPARISON:  None. FINDINGS: The heart size and mediastinal contours are within normal limits. Both lungs are clear. The visualized skeletal structures are unremarkable. IMPRESSION: No active cardiopulmonary disease. Electronically Signed   By: Marijo Conception M.D.   On: 02/10/2019 09:31    Assessment & Plan:   Tim Decker was seen today for hypertension and hyperlipidemia.  Diagnoses and all orders for this visit:  Pure hyperglyceridemia- Improvement noted.  Will continue the omega-3 fish oil and I have encouraged him to improve his lifestyle modifications. -     Triglycerides; Future  Hypertensive left ventricular  hypertrophy, without heart failure- His blood pressure is adequately well controlled.  Essential hypertension- His blood pressure is slightly over controlled and he has had a decline in his creatinine clearance.  I have asked him to stop taking spironolactone but to stay on the current doses of amlodipine and carvedilol. -     Basic metabolic panel; Future  Gastroesophageal reflux disease without esophagitis -     dexlansoprazole (DEXILANT) 60 MG capsule; Take 1 capsule (60 mg total) by mouth daily.   I have discontinued Keyshaun T. Gosselin's spironolactone. I am also having him start  on Dexilant. Additionally, I am having him maintain his aspirin, tadalafil, Farxiga, potassium chloride, doxazosin, omega-3 acid ethyl esters, carvedilol, metFORMIN, Cholecalciferol, irbesartan, rosuvastatin, and amLODipine.  Meds ordered this encounter  Medications  . dexlansoprazole (DEXILANT) 60 MG capsule    Sig: Take 1 capsule (60 mg total) by mouth daily.    Dispense:  90 capsule    Refill:  1     Follow-up: Return in about 6 months (around 09/24/2019).  Sanda Linger, MD

## 2019-04-22 ENCOUNTER — Other Ambulatory Visit: Payer: Self-pay | Admitting: Internal Medicine

## 2019-04-22 DIAGNOSIS — E1121 Type 2 diabetes mellitus with diabetic nephropathy: Secondary | ICD-10-CM

## 2019-05-06 ENCOUNTER — Other Ambulatory Visit: Payer: Self-pay | Admitting: Internal Medicine

## 2019-05-13 ENCOUNTER — Other Ambulatory Visit: Payer: Self-pay | Admitting: Internal Medicine

## 2019-05-13 DIAGNOSIS — E559 Vitamin D deficiency, unspecified: Secondary | ICD-10-CM

## 2019-05-22 ENCOUNTER — Encounter: Payer: Self-pay | Admitting: Internal Medicine

## 2019-05-22 DIAGNOSIS — E1121 Type 2 diabetes mellitus with diabetic nephropathy: Secondary | ICD-10-CM

## 2019-05-22 DIAGNOSIS — I10 Essential (primary) hypertension: Secondary | ICD-10-CM

## 2019-05-23 ENCOUNTER — Other Ambulatory Visit: Payer: Self-pay | Admitting: Internal Medicine

## 2019-05-23 DIAGNOSIS — I10 Essential (primary) hypertension: Secondary | ICD-10-CM

## 2019-05-23 MED ORDER — FARXIGA 5 MG PO TABS: 5.0000 mg | ORAL_TABLET | Freq: Every day | ORAL | 1 refills | Status: DC

## 2019-05-23 MED ORDER — CARVEDILOL 6.25 MG PO TABS: 6.2500 mg | ORAL_TABLET | Freq: Two times a day (BID) | ORAL | 1 refills | Status: DC

## 2019-06-08 ENCOUNTER — Other Ambulatory Visit: Payer: Self-pay | Admitting: Internal Medicine

## 2019-06-08 DIAGNOSIS — E876 Hypokalemia: Secondary | ICD-10-CM

## 2019-06-08 DIAGNOSIS — E559 Vitamin D deficiency, unspecified: Secondary | ICD-10-CM

## 2019-06-08 DIAGNOSIS — I10 Essential (primary) hypertension: Secondary | ICD-10-CM

## 2019-06-10 ENCOUNTER — Other Ambulatory Visit: Payer: Self-pay | Admitting: Internal Medicine

## 2019-07-11 ENCOUNTER — Ambulatory Visit: Payer: BC Managed Care – PPO | Attending: Internal Medicine

## 2019-07-11 DIAGNOSIS — Z23 Encounter for immunization: Secondary | ICD-10-CM

## 2019-07-11 NOTE — Progress Notes (Signed)
   Covid-19 Vaccination Clinic  Name:  Abelardo Seidner    MRN: [id number] DOB: 09-03-1955  07/11/2019  Mr. Concannon was observed post Covid-19 immunization for 15 minutes without incident. He was provided with Vaccine Information Sheet and instruction to access the V-Safe system.   Mr. Kronk was instructed to call 911 with any severe reactions post vaccine: Marland Kitchen Difficulty breathing  . Swelling of face and throat  . A fast heartbeat  . A bad rash all over body  . Dizziness and weakness   Immunizations Administered    Name Date Dose VIS Date Route   Moderna COVID-19 Vaccine 07/11/2019 11:43 AM 0.5 mL 03/18/2019 Intramuscular   Manufacturer: Moderna   Lot: [id number]   NDC: 70964-383-81

## 2019-08-04 ENCOUNTER — Other Ambulatory Visit: Payer: Self-pay | Admitting: Internal Medicine

## 2019-08-04 DIAGNOSIS — E559 Vitamin D deficiency, unspecified: Secondary | ICD-10-CM

## 2019-08-12 ENCOUNTER — Ambulatory Visit: Payer: BC Managed Care – PPO | Attending: Internal Medicine

## 2019-08-12 DIAGNOSIS — Z23 Encounter for immunization: Secondary | ICD-10-CM

## 2019-08-12 NOTE — Progress Notes (Signed)
   Covid-19 Vaccination Clinic  Name:  Ayub Kirsh    MRN: [id number] DOB: 01-Apr-1956  08/12/2019  Mr. Reiswig was observed post Covid-19 immunization for 15 minutes without incident. He was provided with Vaccine Information Sheet and instruction to access the V-Safe system.   Mr. Heemstra was instructed to call 911 with any severe reactions post vaccine: Marland Kitchen Difficulty breathing  . Swelling of face and throat  . A fast heartbeat  . A bad rash all over body  . Dizziness and weakness   Immunizations Administered    Name Date Dose VIS Date Route   Moderna COVID-19 Vaccine 08/12/2019 12:13 PM 0.5 mL 03/2019 Intramuscular   Manufacturer: Moderna   Lot: [id number]   NDC: 21031-281-18

## 2019-08-13 ENCOUNTER — Other Ambulatory Visit: Payer: Self-pay | Admitting: Internal Medicine

## 2019-08-13 DIAGNOSIS — I10 Essential (primary) hypertension: Secondary | ICD-10-CM

## 2019-09-16 ENCOUNTER — Other Ambulatory Visit: Payer: Self-pay | Admitting: Internal Medicine

## 2019-09-16 DIAGNOSIS — I10 Essential (primary) hypertension: Secondary | ICD-10-CM

## 2019-09-16 DIAGNOSIS — E785 Hyperlipidemia, unspecified: Secondary | ICD-10-CM

## 2019-09-16 DIAGNOSIS — E781 Pure hyperglyceridemia: Secondary | ICD-10-CM

## 2019-09-16 DIAGNOSIS — E876 Hypokalemia: Secondary | ICD-10-CM

## 2019-09-19 ENCOUNTER — Other Ambulatory Visit: Payer: Self-pay | Admitting: Internal Medicine

## 2019-09-19 DIAGNOSIS — E876 Hypokalemia: Secondary | ICD-10-CM

## 2019-09-19 DIAGNOSIS — I10 Essential (primary) hypertension: Secondary | ICD-10-CM

## 2019-09-23 ENCOUNTER — Other Ambulatory Visit: Payer: Self-pay | Admitting: Internal Medicine

## 2019-09-23 DIAGNOSIS — I10 Essential (primary) hypertension: Secondary | ICD-10-CM

## 2019-09-23 DIAGNOSIS — E876 Hypokalemia: Secondary | ICD-10-CM

## 2019-09-23 MED ORDER — SPIRONOLACTONE 25 MG PO TABS: 25.0000 mg | ORAL_TABLET | Freq: Every day | ORAL | 0 refills | Status: DC

## 2019-09-23 NOTE — Telephone Encounter (Signed)
PCP sent in rx for pt.

## 2019-09-23 NOTE — Telephone Encounter (Signed)
Called patient and offered appointment for today, patient unable to come today. Scheduled for Thursday 09/25/2019.

## 2019-09-25 ENCOUNTER — Ambulatory Visit (INDEPENDENT_AMBULATORY_CARE_PROVIDER_SITE_OTHER): Payer: BC Managed Care – PPO | Admitting: Internal Medicine

## 2019-09-25 ENCOUNTER — Encounter: Payer: Self-pay | Admitting: Internal Medicine

## 2019-09-25 ENCOUNTER — Other Ambulatory Visit: Payer: Self-pay

## 2019-09-25 VITALS — BP 130/80 | HR 71 | Temp 98.4°F | Ht 69.0 in | Wt 228.1 lb

## 2019-09-25 DIAGNOSIS — E1121 Type 2 diabetes mellitus with diabetic nephropathy: Secondary | ICD-10-CM | POA: Diagnosis not present

## 2019-09-25 DIAGNOSIS — R809 Proteinuria, unspecified: Secondary | ICD-10-CM | POA: Diagnosis not present

## 2019-09-25 DIAGNOSIS — Z86010 Personal history of colonic polyps: Secondary | ICD-10-CM

## 2019-09-25 DIAGNOSIS — E785 Hyperlipidemia, unspecified: Secondary | ICD-10-CM | POA: Diagnosis not present

## 2019-09-25 DIAGNOSIS — I10 Essential (primary) hypertension: Secondary | ICD-10-CM | POA: Diagnosis not present

## 2019-09-25 DIAGNOSIS — E781 Pure hyperglyceridemia: Secondary | ICD-10-CM

## 2019-09-25 DIAGNOSIS — Z0000 Encounter for general adult medical examination without abnormal findings: Secondary | ICD-10-CM | POA: Diagnosis not present

## 2019-09-25 DIAGNOSIS — I119 Hypertensive heart disease without heart failure: Secondary | ICD-10-CM

## 2019-09-25 LAB — URINALYSIS, ROUTINE W REFLEX MICROSCOPIC
Bilirubin Urine: NEGATIVE
Ketones, ur: NEGATIVE
Leukocytes,Ua: NEGATIVE
Nitrite: NEGATIVE
Specific Gravity, Urine: 1.025 (ref 1.000–1.030)
Total Protein, Urine: 100 — AB
Urine Glucose: >=1000 — AB
Urobilinogen, UA: 0.2 (ref 0.0–1.0)
pH: 5.5 (ref 5.0–8.0)

## 2019-09-25 LAB — MICROALBUMIN / CREATININE URINE RATIO
Creatinine,U: 82.9 mg/dL
Microalb Creat Ratio: 75.7 mg/g — ABNORMAL HIGH (ref 0.0–30.0)
Microalb, Ur: 62.8 mg/dL — ABNORMAL HIGH (ref 0.0–1.9)

## 2019-09-25 MED ORDER — ROSUVASTATIN CALCIUM 10 MG PO TABS: 10.0000 mg | ORAL_TABLET | Freq: Every day | ORAL | 1 refills | Status: DC

## 2019-09-25 NOTE — Progress Notes (Signed)
Subjective:  Patient ID: Tim Decker, male    DOB: Feb 24, 1956  Age: 64 y.o. MRN: [id number]  CC: Diabetes, Hypertension, Hyperlipidemia, and Annual Exam  This visit occurred during the SARS-CoV-2 public health emergency.  Safety protocols were in place, including screening questions prior to the visit, additional usage of staff PPE, and extensive cleaning of exam room while observing appropriate contact time as indicated for disinfecting solutions.    HPI Tim Decker presents for a CPX.  He has been working on his lifestyle modifications over the last few months and has been able to lose about 6 pounds.  He denies any recent episodes of chest pain, shortness of breath, headache, blurred vision, palpitations, edema, or fatigue.  He complains of intermittent diarrhea and is concerned that it may be related to the Metformin.  He denies abdominal pain, bloody stools, or melena.  Outpatient Medications Prior to Visit  Medication Sig Dispense Refill  . amLODipine (NORVASC) 10 MG tablet Take 1 tablet (10 mg total) by mouth daily. 90 tablet 1  . aspirin 81 MG tablet Take 81 mg by mouth daily.      Marland Kitchen BYSTOLIC 10 MG tablet Take 1 tablet by mouth once daily 90 tablet 0  . Cholecalciferol (VITAMIN D3) 1.25 MG (50000 UT) CAPS Take 1 capsule by mouth once a week 12 capsule 0  . dexlansoprazole (DEXILANT) 60 MG capsule Take 1 capsule (60 mg total) by mouth daily. 90 capsule 1  . doxazosin (CARDURA) 2 MG tablet Take 1 tablet by mouth once daily 180 tablet 0  . potassium chloride (KLOR-CON) 10 MEQ tablet Take 1 tablet by mouth once daily 180 tablet 0  . spironolactone (ALDACTONE) 25 MG tablet Take 1 tablet (25 mg total) by mouth daily. 30 tablet 0  . tadalafil (CIALIS) 20 MG tablet TAKE ONE TABLET BY MOUTH EVERY 3 DAYS AS NEEDED 6 tablet 2  . carvedilol (COREG) 6.25 MG tablet Take 1 tablet (6.25 mg total) by mouth 2 (two) times daily with a meal. 180 tablet 1  . dapagliflozin propanediol  (FARXIGA) 5 MG TABS tablet Take 5 mg by mouth daily before breakfast. 90 tablet 1  . irbesartan (AVAPRO) 300 MG tablet Take 1 tablet (300 mg total) by mouth daily. 90 tablet 1  . metFORMIN (GLUCOPHAGE) 1000 MG tablet TAKE 1 TABLET BY MOUTH TWICE DAILY WITH A MEAL 180 tablet 1  . omega-3 acid ethyl esters (LOVAZA) 1 g capsule Take 2 capsules (2 g total) by mouth 2 (two) times daily. 360 capsule 1  . rosuvastatin (CRESTOR) 10 MG tablet Take 1 tablet (10 mg total) by mouth daily. 90 tablet 1   No facility-administered medications prior to visit.    ROS Review of Systems  Constitutional: Negative for appetite change, diaphoresis, fatigue and unexpected weight change.  HENT: Negative.   Eyes: Negative.  Negative for visual disturbance.  Respiratory: Negative for cough, chest tightness, shortness of breath and wheezing.   Cardiovascular: Negative for chest pain, palpitations and leg swelling.  Gastrointestinal: Positive for diarrhea. Negative for abdominal pain, constipation, nausea and vomiting.  Endocrine: Negative.  Negative for cold intolerance, heat intolerance, polydipsia, polyphagia and polyuria.  Genitourinary: Negative.  Negative for difficulty urinating and hematuria.  Musculoskeletal: Negative for arthralgias, back pain, joint swelling, myalgias and neck pain.  Skin: Negative for color change and pallor.  Neurological: Negative.  Negative for dizziness, weakness, light-headedness, numbness and headaches.  Hematological: Negative for adenopathy. Does not bruise/bleed easily.  Psychiatric/Behavioral: Negative.  Objective:  BP 130/80 (BP Location: Left Arm, Patient Position: Sitting, Cuff Size: Large)   Pulse 71   Temp 98.4 F (36.9 C) (Oral)   Ht 5\' 9"  (1.753 m)   Wt 228 lb 2 oz (103.5 kg)   SpO2 97%   BMI 33.69 kg/m   BP Readings from Last 3 Encounters:  09/25/19 130/80  03/26/19 122/76  02/12/19 (!) 176/104    Wt Readings from Last 3 Encounters:  09/25/19 228 lb 2  oz (103.5 kg)  03/26/19 236 lb (107 kg)  02/19/19 232 lb (105.2 kg)    Physical Exam Vitals reviewed. Exam conducted with a chaperone present Everette Rank).  Constitutional:      General: He is not in acute distress.    Appearance: He is obese. He is not ill-appearing or diaphoretic.  HENT:     Nose: Nose normal.     Mouth/Throat:     Mouth: Mucous membranes are moist.     Pharynx: No oropharyngeal exudate.  Eyes:     General: No scleral icterus.    Conjunctiva/sclera: Conjunctivae normal.  Cardiovascular:     Rate and Rhythm: Normal rate and regular rhythm.     Heart sounds: No murmur heard.      Comments: EKG - NSR, 64 bpm LAD Otherwise normal EKG Pulmonary:     Effort: Pulmonary effort is normal.     Breath sounds: No stridor. No wheezing, rhonchi or rales.  Abdominal:     General: Abdomen is protuberant. Bowel sounds are normal. There is no distension.     Palpations: Abdomen is soft. There is no hepatomegaly, splenomegaly or mass.     Tenderness: There is no abdominal tenderness.     Hernia: There is no hernia in the left inguinal area or right inguinal area.  Genitourinary:    Pubic Area: No rash.      Penis: Normal. No discharge, swelling or lesions.      Testes: Normal.        Right: Mass, tenderness or swelling not present.        Left: Mass, tenderness or swelling not present.     Epididymis:     Right: Normal. Not inflamed or enlarged. No mass.     Left: Normal. Not inflamed or enlarged. No mass.     Prostate: Normal. Not enlarged, not tender and no nodules present.     Rectum: Normal. Guaiac result negative. No mass, tenderness, anal fissure, external hemorrhoid or internal hemorrhoid. Normal anal tone.  Musculoskeletal:        General: Normal range of motion.     Cervical back: Normal range of motion. No rigidity or tenderness.     Right lower leg: No edema.     Left lower leg: No edema.  Lymphadenopathy:     Cervical: No cervical adenopathy.      Lower Body: No right inguinal adenopathy. No left inguinal adenopathy.  Skin:    General: Skin is warm and dry.     Coloration: Skin is not pale.  Neurological:     General: No focal deficit present.     Mental Status: He is alert and oriented to person, place, and time. Mental status is at baseline.  Psychiatric:        Mood and Affect: Mood normal.        Behavior: Behavior normal.     Lab Results  Component Value Date   WBC 7.2 09/25/2019   HGB 15.4 09/25/2019  HCT 44.3 09/25/2019   PLT 266.0 09/25/2019   GLUCOSE 130 (H) 09/25/2019   CHOL 168 09/25/2019   TRIG (H) 09/25/2019    838.0 Triglyceride is over 400; calculations on Lipids are invalid.   HDL 24.50 (L) 09/25/2019   LDLDIRECT 58.0 09/25/2019   LDLCALC 93 01/15/2014   ALT 31 09/25/2019   AST 21 09/25/2019   NA 139 09/25/2019   K 3.7 09/25/2019   CL 104 09/25/2019   CREATININE 1.33 09/25/2019   BUN 14 09/25/2019   CO2 28 09/25/2019   TSH 1.60 09/25/2019   PSA 1.30 09/25/2019   HGBA1C 6.7 (H) 09/25/2019   MICROALBUR 62.8 (H) 09/25/2019    DG Chest 2 View  Result Date: 02/10/2019 CLINICAL DATA:  Hypertension. EXAM: CHEST - 2 VIEW COMPARISON:  None. FINDINGS: The heart size and mediastinal contours are within normal limits. Both lungs are clear. The visualized skeletal structures are unremarkable. IMPRESSION: No active cardiopulmonary disease. Electronically Signed   By: Lupita Raider M.D.   On: 02/10/2019 09:31    Assessment & Plan:   Jedd was seen today for diabetes, hypertension, hyperlipidemia and annual exam.  Diagnoses and all orders for this visit:  Diabetes mellitus with proteinuric diabetic nephropathy (HCC)- His A1c is at 6.7%.  He complains of diarrhea so will discontinue the Metformin.  I recommended that he increase the dose of the SGLT2 inhibitor. -     Basic metabolic panel; Future -     Hemoglobin A1c; Future -     Microalbumin / creatinine urine ratio; Future -     Ambulatory referral  to Ophthalmology -     HM Diabetes Foot Exam -     Microalbumin / creatinine urine ratio -     Hemoglobin A1c -     Basic metabolic panel -     icosapent Ethyl (VASCEPA) 1 g capsule; Take 2 capsules (2 g total) by mouth 2 (two) times daily. -     dapagliflozin propanediol (FARXIGA) 10 MG TABS tablet; Take 1 tablet (10 mg total) by mouth daily. -     irbesartan (AVAPRO) 300 MG tablet; Take 1 tablet (300 mg total) by mouth daily. -     Amb Referral to Nutrition and Diabetic E  Microalbuminuria- Will continue the ARB. -     irbesartan (AVAPRO) 300 MG tablet; Take 1 tablet (300 mg total) by mouth daily.  Hyperlipidemia LDL goal <130- I have asked him to take a statin for cardiovascular risk reduction. -     Lipid panel; Future -     rosuvastatin (CRESTOR) 10 MG tablet; Take 1 tablet (10 mg total) by mouth daily. -     TSH; Future -     Hepatic function panel; Future -     Hepatic function panel -     TSH -     Lipid panel  Hypertensive left ventricular hypertrophy, without heart failure- He underwent a myocardial perfusion imaging about 6 or 8 months ago which was reassuring.  He is asymptomatic with this.  I have asked him to improve his lifestyle modifications.  Will continue the ARB and SGLT2 inhibitor. -     CBC with Differential/Platelet; Future -     Basic metabolic panel; Future -     Basic metabolic panel -     CBC with Differential/Platelet -     irbesartan (AVAPRO) 300 MG tablet; Take 1 tablet (300 mg total) by mouth daily.  Essential hypertension- His blood pressure  is adequately well controlled. -     CBC with Differential/Platelet; Future -     Basic metabolic panel; Future -     Urinalysis, Routine w reflex microscopic; Future -     EKG 12-Lead -     Urinalysis, Routine w reflex microscopic -     Basic metabolic panel -     CBC with Differential/Platelet -     irbesartan (AVAPRO) 300 MG tablet; Take 1 tablet (300 mg total) by mouth daily.  COLONIC POLYPS, HX OF -      Ambulatory referral to Gastroenterology  Routine general medical examination at a health care facility- Exam completed, labs reviewed, vaccines reviewed and updated, he is referred for colon cancer screening, patient education material was given. -     PSA; Future -     PSA  Pure hyperglyceridemia- His triglycerides are elevated at 838.  I recommended that he restart a fish oil supplement.  I have asked him to improve his lifestyle modifications and to be seen by a nutritionist. -     icosapent Ethyl (VASCEPA) 1 g capsule; Take 2 capsules (2 g total) by mouth 2 (two) times daily. -     Amb Referral to Nutrition and Diabetic E  Other orders -     LDL cholesterol, direct   I have discontinued Nicholus T. Trieu's metFORMIN, carvedilol, and [place]. I have also changed his omega-3 acid ethyl esters to icosapent Ethyl. Additionally, I am having him start on dapagliflozin propanediol. Lastly, I am having him maintain his aspirin, tadalafil, amLODipine, Dexilant, doxazosin, Vitamin D3, potassium chloride, Bystolic, spironolactone, rosuvastatin, and irbesartan.  Meds ordered this encounter  Medications  . rosuvastatin (CRESTOR) 10 MG tablet    Sig: Take 1 tablet (10 mg total) by mouth daily.    Dispense:  90 tablet    Refill:  1  . icosapent Ethyl (VASCEPA) 1 g capsule    Sig: Take 2 capsules (2 g total) by mouth 2 (two) times daily.    Dispense:  360 capsule    Refill:  1  . dapagliflozin propanediol (FARXIGA) 10 MG TABS tablet    Sig: Take 1 tablet (10 mg total) by mouth daily.    Dispense:  90 tablet    Refill:  1  . irbesartan (AVAPRO) 300 MG tablet    Sig: Take 1 tablet (300 mg total) by mouth daily.    Dispense:  90 tablet    Refill:  1   In addition to time spent on CPE, I spent 60 minutes in preparing to see the patient by review of recent labs, imaging and procedures, obtaining and reviewing separately obtained history, communicating with the patient and family or caregiver, ordering  medications, tests or procedures, and documenting clinical information in the EHR including the differential Dx, treatment, and any further evaluation and other management of 1. Diabetes mellitus with proteinuric diabetic nephropathy (HCC) 2. Microalbuminuria 3. Hyperlipidemia LDL goal <130 4. Hypertensive left ventricular hypertrophy, without heart failure 5. Essential hypertension 6. Pure hyperglyceridemia     Follow-up: Return in about 6 months (around 03/26/2020).  Sanda Linger, MD

## 2019-09-25 NOTE — Patient Instructions (Signed)

## 2019-09-26 LAB — CBC WITH DIFFERENTIAL/PLATELET
Basophils Absolute: 0 10*3/uL (ref 0.0–0.1)
Basophils Relative: 0.4 % (ref 0.0–3.0)
Eosinophils Absolute: 0.3 10*3/uL (ref 0.0–0.7)
Eosinophils Relative: 3.5 % (ref 0.0–5.0)
HCT: 44.3 % (ref 39.0–52.0)
Hemoglobin: 15.4 g/dL (ref 13.0–17.0)
Lymphocytes Relative: 21.8 % (ref 12.0–46.0)
Lymphs Abs: 1.6 10*3/uL (ref 0.7–4.0)
MCHC: 34.7 g/dL (ref 30.0–36.0)
MCV: 92.8 fl (ref 78.0–100.0)
Monocytes Absolute: 0.6 10*3/uL (ref 0.1–1.0)
Monocytes Relative: 8 % (ref 3.0–12.0)
Neutro Abs: 4.8 10*3/uL (ref 1.4–7.7)
Neutrophils Relative %: 66.3 % (ref 43.0–77.0)
Platelets: 266 10*3/uL (ref 150.0–400.0)
RBC: 4.77 Mil/uL (ref 4.22–5.81)
RDW: 15.7 % — ABNORMAL HIGH (ref 11.5–15.5)
WBC: 7.2 10*3/uL (ref 4.0–10.5)

## 2019-09-26 LAB — BASIC METABOLIC PANEL
BUN: 14 mg/dL (ref 6–23)
CO2: 28 mEq/L (ref 19–32)
Calcium: 9.4 mg/dL (ref 8.4–10.5)
Chloride: 104 mEq/L (ref 96–112)
Creatinine, Ser: 1.33 mg/dL (ref 0.40–1.50)
GFR: 54.04 mL/min — ABNORMAL LOW (ref >60.00)
Glucose, Bld: 130 mg/dL — ABNORMAL HIGH (ref 70–99)
Potassium: 3.7 mEq/L (ref 3.5–5.1)
Sodium: 139 mEq/L (ref 135–145)

## 2019-09-26 LAB — HEPATIC FUNCTION PANEL
ALT: 31 U/L (ref 0–53)
AST: 21 U/L (ref 0–37)
Albumin: 4.3 g/dL (ref 3.5–5.2)
Alkaline Phosphatase: 47 U/L (ref 39–117)
Bilirubin, Direct: 0.1 mg/dL (ref 0.0–0.3)
Total Bilirubin: 0.5 mg/dL (ref 0.2–1.2)
Total Protein: 6.3 g/dL (ref 6.0–8.3)

## 2019-09-26 LAB — LIPID PANEL
Cholesterol: 168 mg/dL (ref 0–200)
HDL: 24.5 mg/dL — ABNORMAL LOW (ref >39.00)
Total CHOL/HDL Ratio: 7
Triglycerides: 838 mg/dL — ABNORMAL HIGH (ref 0.0–149.0)

## 2019-09-26 LAB — PSA: PSA: 1.3 ng/mL (ref 0.10–4.00)

## 2019-09-26 LAB — LDL CHOLESTEROL, DIRECT: Direct LDL: 58 mg/dL

## 2019-09-26 LAB — TSH: TSH: 1.6 u[IU]/mL (ref 0.35–4.50)

## 2019-09-29 LAB — HEMOGLOBIN A1C: Hgb A1c MFr Bld: 6.7 % — ABNORMAL HIGH (ref 4.6–6.5)

## 2019-09-29 MED ORDER — DAPAGLIFLOZIN PROPANEDIOL 10 MG PO TABS: 10.0000 mg | ORAL_TABLET | Freq: Every day | ORAL | 1 refills | Status: DC

## 2019-09-29 MED ORDER — IRBESARTAN 300 MG PO TABS: 300.0000 mg | ORAL_TABLET | Freq: Every day | ORAL | 1 refills | Status: DC

## 2019-09-29 MED ORDER — ICOSAPENT ETHYL 1 G PO CAPS: 2.0000 g | ORAL_CAPSULE | Freq: Two times a day (BID) | ORAL | 1 refills | Status: DC

## 2019-10-01 ENCOUNTER — Encounter: Payer: Self-pay | Admitting: Internal Medicine

## 2019-10-14 ENCOUNTER — Telehealth: Payer: Self-pay

## 2019-10-14 NOTE — Telephone Encounter (Signed)
Key: [id number]

## 2019-10-27 ENCOUNTER — Other Ambulatory Visit: Payer: Self-pay | Admitting: Internal Medicine

## 2019-10-27 DIAGNOSIS — I10 Essential (primary) hypertension: Secondary | ICD-10-CM

## 2019-10-27 DIAGNOSIS — E559 Vitamin D deficiency, unspecified: Secondary | ICD-10-CM

## 2019-10-27 DIAGNOSIS — E876 Hypokalemia: Secondary | ICD-10-CM

## 2019-11-06 ENCOUNTER — Other Ambulatory Visit: Payer: Self-pay | Admitting: Internal Medicine

## 2019-12-08 ENCOUNTER — Other Ambulatory Visit: Payer: Self-pay | Admitting: Internal Medicine

## 2019-12-08 DIAGNOSIS — E559 Vitamin D deficiency, unspecified: Secondary | ICD-10-CM

## 2019-12-30 ENCOUNTER — Other Ambulatory Visit: Payer: Self-pay | Admitting: Internal Medicine

## 2019-12-30 DIAGNOSIS — I10 Essential (primary) hypertension: Secondary | ICD-10-CM

## 2020-01-01 ENCOUNTER — Other Ambulatory Visit: Payer: Self-pay | Admitting: Internal Medicine

## 2020-01-01 DIAGNOSIS — I10 Essential (primary) hypertension: Secondary | ICD-10-CM

## 2020-02-20 ENCOUNTER — Other Ambulatory Visit: Payer: Self-pay | Admitting: Internal Medicine

## 2020-02-20 DIAGNOSIS — I10 Essential (primary) hypertension: Secondary | ICD-10-CM

## 2020-03-12 ENCOUNTER — Other Ambulatory Visit: Payer: Self-pay | Admitting: Internal Medicine

## 2020-03-12 DIAGNOSIS — E559 Vitamin D deficiency, unspecified: Secondary | ICD-10-CM

## 2020-04-23 ENCOUNTER — Encounter: Payer: Self-pay | Admitting: Internal Medicine

## 2020-05-03 ENCOUNTER — Ambulatory Visit: Payer: BC Managed Care – PPO | Admitting: Internal Medicine

## 2020-05-12 ENCOUNTER — Other Ambulatory Visit: Payer: Self-pay

## 2020-05-12 ENCOUNTER — Ambulatory Visit (INDEPENDENT_AMBULATORY_CARE_PROVIDER_SITE_OTHER): Payer: Medicare PPO | Admitting: Internal Medicine

## 2020-05-12 ENCOUNTER — Encounter: Payer: Self-pay | Admitting: Internal Medicine

## 2020-05-12 VITALS — BP 160/102 | HR 86 | Temp 98.1°F | Resp 16 | Ht 69.0 in | Wt 246.0 lb

## 2020-05-12 DIAGNOSIS — Z860100 Personal history of colon polyps, unspecified: Secondary | ICD-10-CM

## 2020-05-12 DIAGNOSIS — I10 Essential (primary) hypertension: Secondary | ICD-10-CM

## 2020-05-12 DIAGNOSIS — K219 Gastro-esophageal reflux disease without esophagitis: Secondary | ICD-10-CM | POA: Diagnosis not present

## 2020-05-12 DIAGNOSIS — E876 Hypokalemia: Secondary | ICD-10-CM | POA: Insufficient documentation

## 2020-05-12 DIAGNOSIS — E785 Hyperlipidemia, unspecified: Secondary | ICD-10-CM | POA: Diagnosis not present

## 2020-05-12 DIAGNOSIS — E781 Pure hyperglyceridemia: Secondary | ICD-10-CM

## 2020-05-12 DIAGNOSIS — E1121 Type 2 diabetes mellitus with diabetic nephropathy: Secondary | ICD-10-CM

## 2020-05-12 DIAGNOSIS — R809 Proteinuria, unspecified: Secondary | ICD-10-CM

## 2020-05-12 DIAGNOSIS — Z23 Encounter for immunization: Secondary | ICD-10-CM

## 2020-05-12 DIAGNOSIS — N1831 Chronic kidney disease, stage 3a: Secondary | ICD-10-CM

## 2020-05-12 DIAGNOSIS — R0683 Snoring: Secondary | ICD-10-CM

## 2020-05-12 DIAGNOSIS — I119 Hypertensive heart disease without heart failure: Secondary | ICD-10-CM | POA: Diagnosis not present

## 2020-05-12 LAB — CBC WITH DIFFERENTIAL/PLATELET
Basophils Absolute: 0 10*3/uL (ref 0.0–0.1)
Basophils Relative: 0.7 % (ref 0.0–3.0)
Eosinophils Absolute: 0.3 10*3/uL (ref 0.0–0.7)
Eosinophils Relative: 3.6 % (ref 0.0–5.0)
HCT: 46.3 % (ref 39.0–52.0)
Hemoglobin: 15.9 g/dL (ref 13.0–17.0)
Lymphocytes Relative: 22.7 % (ref 12.0–46.0)
Lymphs Abs: 1.7 10*3/uL (ref 0.7–4.0)
MCHC: 34.3 g/dL (ref 30.0–36.0)
MCV: 91.4 fl (ref 78.0–100.0)
Monocytes Absolute: 0.8 10*3/uL (ref 0.1–1.0)
Monocytes Relative: 9.9 % (ref 3.0–12.0)
Neutro Abs: 4.8 10*3/uL (ref 1.4–7.7)
Neutrophils Relative %: 63.1 % (ref 43.0–77.0)
Platelets: 236 10*3/uL (ref 150.0–400.0)
RBC: 5.07 Mil/uL (ref 4.22–5.81)
RDW: 15 % (ref 11.5–15.5)
WBC: 7.6 10*3/uL (ref 4.0–10.5)

## 2020-05-12 LAB — BASIC METABOLIC PANEL
BUN: 25 mg/dL — ABNORMAL HIGH (ref 6–23)
CO2: 32 mEq/L (ref 19–32)
Calcium: 10 mg/dL (ref 8.4–10.5)
Chloride: 100 mEq/L (ref 96–112)
Creatinine, Ser: 1.65 mg/dL — ABNORMAL HIGH (ref 0.40–1.50)
GFR: 43.42 mL/min — ABNORMAL LOW (ref >60.00)
Glucose, Bld: 105 mg/dL — ABNORMAL HIGH (ref 70–99)
Potassium: 4.4 mEq/L (ref 3.5–5.1)
Sodium: 137 mEq/L (ref 135–145)

## 2020-05-12 LAB — HEPATIC FUNCTION PANEL
ALT: 30 U/L (ref 0–53)
AST: 18 U/L (ref 0–37)
Albumin: 4 g/dL (ref 3.5–5.2)
Alkaline Phosphatase: 42 U/L (ref 39–117)
Bilirubin, Direct: 0.1 mg/dL (ref 0.0–0.3)
Total Bilirubin: 0.6 mg/dL (ref 0.2–1.2)
Total Protein: 6.7 g/dL (ref 6.0–8.3)

## 2020-05-12 LAB — LIPID PANEL
Cholesterol: 212 mg/dL — ABNORMAL HIGH (ref 0–200)
HDL: 27.3 mg/dL — ABNORMAL LOW (ref >39.00)
Total CHOL/HDL Ratio: 8
Triglycerides: 785 mg/dL — ABNORMAL HIGH (ref 0.0–149.0)

## 2020-05-12 LAB — LDL CHOLESTEROL, DIRECT: Direct LDL: 65 mg/dL

## 2020-05-12 LAB — HEMOGLOBIN A1C: Hgb A1c MFr Bld: 7.3 % — ABNORMAL HIGH (ref 4.6–6.5)

## 2020-05-12 MED ORDER — AMLODIPINE BESYLATE 10 MG PO TABS: 10.0000 mg | ORAL_TABLET | Freq: Every day | ORAL | 1 refills | Status: DC

## 2020-05-12 MED ORDER — DEXLANSOPRAZOLE 60 MG PO CPDR: 60.0000 mg | DELAYED_RELEASE_CAPSULE | Freq: Every day | ORAL | 1 refills | Status: DC

## 2020-05-12 MED ORDER — RYBELSUS 3 MG PO TABS
1.0000 | ORAL_TABLET | Freq: Every day | ORAL | 0 refills | Status: DC
Start: 2020-05-12 — End: 2020-06-15

## 2020-05-12 MED ORDER — NEBIVOLOL HCL 10 MG PO TABS
10.0000 mg | ORAL_TABLET | Freq: Every day | ORAL | 1 refills | Status: DC
Start: 2020-05-12 — End: 2020-11-24

## 2020-05-12 MED ORDER — ICOSAPENT ETHYL 1 G PO CAPS: 2.0000 g | ORAL_CAPSULE | Freq: Two times a day (BID) | ORAL | 1 refills | Status: DC

## 2020-05-12 MED ORDER — DAPAGLIFLOZIN PROPANEDIOL 10 MG PO TABS: 10.0000 mg | ORAL_TABLET | Freq: Every day | ORAL | 1 refills | Status: DC

## 2020-05-12 MED ORDER — ROSUVASTATIN CALCIUM 10 MG PO TABS: 10.0000 mg | ORAL_TABLET | Freq: Every day | ORAL | 1 refills | Status: DC

## 2020-05-12 MED ORDER — DOXAZOSIN MESYLATE 2 MG PO TABS: 2.0000 mg | ORAL_TABLET | Freq: Every day | ORAL | 0 refills | Status: DC

## 2020-05-12 MED ORDER — IRBESARTAN 300 MG PO TABS: 300.0000 mg | ORAL_TABLET | Freq: Every day | ORAL | 1 refills | Status: DC

## 2020-05-12 MED ORDER — SPIRONOLACTONE 25 MG PO TABS: 25.0000 mg | ORAL_TABLET | Freq: Every day | ORAL | 1 refills | Status: DC

## 2020-05-12 MED ORDER — POTASSIUM CHLORIDE ER 10 MEQ PO TBCR: 10.0000 meq | EXTENDED_RELEASE_TABLET | Freq: Every day | ORAL | 0 refills | Status: DC

## 2020-05-12 NOTE — Progress Notes (Signed)
Subjective:  Patient ID: Tim Decker, male    DOB: [birth year]  Age: 65 y.o. MRN: [id number]  CC: Hypertension, Hyperlipidemia, and Diabetes  This visit occurred during the SARS-CoV-2 public health emergency.  Safety protocols were in place, including screening questions prior to the visit, additional usage of staff PPE, and extensive cleaning of exam room while observing appropriate contact time as indicated for disinfecting solutions.    HPI Tim Decker presents for f/up - He ran out of some of his meds recently. He has not been working on his lifestyle modifications. He complains of weight gain. He is active and denies any recent episodes of chest pain, shortness of breath, palpitations, edema, or fatigue.  Outpatient Medications Prior to Visit  Medication Sig Dispense Refill  . aspirin 81 MG tablet Take 81 mg by mouth daily.    . Cholecalciferol (VITAMIN D3) 1.25 MG (50000 UT) CAPS Take 1 capsule by mouth once a week 12 capsule 0  . tadalafil (CIALIS) 20 MG tablet TAKE ONE TABLET BY MOUTH EVERY 3 DAYS AS NEEDED 6 tablet 2  . amLODipine (NORVASC) 10 MG tablet Take 1 tablet (10 mg total) by mouth daily. 90 tablet 1  . dapagliflozin propanediol (FARXIGA) 10 MG TABS tablet Take 1 tablet (10 mg total) by mouth daily. 90 tablet 1  . dexlansoprazole (DEXILANT) 60 MG capsule Take 1 capsule (60 mg total) by mouth daily. 90 capsule 1  . doxazosin (CARDURA) 2 MG tablet Take 1 tablet by mouth once daily 180 tablet 0  . icosapent Ethyl (VASCEPA) 1 g capsule Take 2 capsules (2 g total) by mouth 2 (two) times daily. 360 capsule 1  . irbesartan (AVAPRO) 300 MG tablet Take 1 tablet (300 mg total) by mouth daily. 90 tablet 1  . nebivolol (BYSTOLIC) 10 MG tablet Take 1 tablet by mouth once daily 90 tablet 0  . omega-3 acid ethyl esters (LOVAZA) 1 g capsule Take 2 capsules by mouth twice daily 720 capsule 1  . potassium chloride (KLOR-CON) 10 MEQ tablet Take 1 tablet by mouth once daily 180  tablet 0  . rosuvastatin (CRESTOR) 10 MG tablet Take 1 tablet (10 mg total) by mouth daily. 90 tablet 1  . spironolactone (ALDACTONE) 25 MG tablet Take 1 tablet by mouth once daily 90 tablet 1   No facility-administered medications prior to visit.    ROS Review of Systems  Constitutional: Positive for unexpected weight change (wt gain). Negative for appetite change, chills, diaphoresis, fatigue and fever.  HENT: Negative.   Eyes: Negative.   Respiratory: Positive for apnea. Negative for cough, chest tightness, shortness of breath and wheezing.   Cardiovascular: Negative for chest pain, palpitations and leg swelling.  Gastrointestinal: Negative for abdominal pain, constipation, diarrhea, nausea and vomiting.  Endocrine: Negative.   Genitourinary: Negative.  Negative for difficulty urinating.  Musculoskeletal: Negative for arthralgias, back pain and myalgias.  Skin: Negative.  Negative for color change and pallor.  Neurological: Negative.  Negative for dizziness, weakness, light-headedness and headaches.  Hematological: Negative for adenopathy. Does not bruise/bleed easily.  Psychiatric/Behavioral: Negative.     Objective:  BP (!) 160/102   Pulse 86   Temp 98.1 F (36.7 C) (Oral)   Resp 16   Ht 5\' 9"  (1.753 m)   Wt 246 lb (111.6 kg)   SpO2 94%   BMI 36.33 kg/m   BP Readings from Last 3 Encounters:  05/12/20 (!) 160/102  09/25/19 130/80  03/26/19 122/76    Wt  Readings from Last 3 Encounters:  05/12/20 246 lb (111.6 kg)  09/25/19 228 lb 2 oz (103.5 kg)  03/26/19 236 lb (107 kg)    Physical Exam Vitals reviewed.  Constitutional:      General: He is not in acute distress.    Appearance: Normal appearance. He is obese. He is not toxic-appearing or diaphoretic.  HENT:     Nose: Nose normal.     Mouth/Throat:     Mouth: Mucous membranes are moist.  Eyes:     General: No scleral icterus.    Conjunctiva/sclera: Conjunctivae normal.  Cardiovascular:     Rate and  Rhythm: Normal rate and regular rhythm.     Heart sounds: No murmur heard.   Pulmonary:     Effort: Pulmonary effort is normal.     Breath sounds: No stridor. No wheezing, rhonchi or rales.  Abdominal:     General: Abdomen is protuberant. Bowel sounds are normal. There is no distension.     Palpations: Abdomen is soft. There is no hepatomegaly, splenomegaly or mass.     Tenderness: There is no abdominal tenderness.  Musculoskeletal:        General: Normal range of motion.     Cervical back: Neck supple.     Right lower leg: No edema.     Left lower leg: No edema.  Lymphadenopathy:     Cervical: No cervical adenopathy.  Skin:    General: Skin is warm and dry.  Neurological:     General: No focal deficit present.     Mental Status: He is alert.  Psychiatric:        Mood and Affect: Mood normal.        Behavior: Behavior normal.     Lab Results  Component Value Date   WBC 7.6 05/12/2020   HGB 15.9 05/12/2020   HCT 46.3 05/12/2020   PLT 236.0 05/12/2020   GLUCOSE 105 (H) 05/12/2020   CHOL 212 (H) 05/12/2020   TRIG (H) 05/12/2020    785.0 Triglyceride is over 400; calculations on Lipids are invalid.   HDL 27.30 (L) 05/12/2020   LDLDIRECT 65.0 05/12/2020   LDLCALC 93 01/15/2014   ALT 30 05/12/2020   AST 18 05/12/2020   NA 137 05/12/2020   K 4.4 05/12/2020   CL 100 05/12/2020   CREATININE 1.65 (H) 05/12/2020   BUN 25 (H) 05/12/2020   CO2 32 05/12/2020   TSH 1.60 09/25/2019   PSA 1.30 09/25/2019   HGBA1C 7.3 (H) 05/12/2020   MICROALBUR 62.8 (H) 09/25/2019    DG Chest 2 View  Result Date: 02/10/2019 CLINICAL DATA:  Hypertension. EXAM: CHEST - 2 VIEW COMPARISON:  None. FINDINGS: The heart size and mediastinal contours are within normal limits. Both lungs are clear. The visualized skeletal structures are unremarkable. IMPRESSION: No active cardiopulmonary disease. Electronically Signed   By: Lupita Raider M.D.   On: 02/10/2019 09:31    Assessment & Plan:   Zarius  was seen today for hypertension, hyperlipidemia and diabetes.  Diagnoses and all orders for this visit:  Hypertensive left ventricular hypertrophy, without heart failure- His blood pressure is not well controlled. He agrees to improve his lifestyle modifications and to restart his antihypertensives. -     irbesartan (AVAPRO) 300 MG tablet; Take 1 tablet (300 mg total) by mouth daily. -     Basic metabolic panel; Future -     Basic metabolic panel  Diabetes mellitus with proteinuric diabetic nephropathy (HCC)- His blood  sugars are not adequately well controlled. Will restart the SGLT2 inhibitor and have asked him to start taking a GLP-1 agonist. -     dapagliflozin propanediol (FARXIGA) 10 MG TABS tablet; Take 1 tablet (10 mg total) by mouth daily. -     irbesartan (AVAPRO) 300 MG tablet; Take 1 tablet (300 mg total) by mouth daily. -     icosapent Ethyl (VASCEPA) 1 g capsule; Take 2 capsules (2 g total) by mouth 2 (two) times daily. -     Ambulatory referral to Ophthalmology -     Hemoglobin A1c; Future -     Hemoglobin A1c -     Semaglutide (RYBELSUS) 3 MG TABS; Take 1 tablet by mouth daily. -     Consult to [facility] Care Management -     Amb Referral to Nutrition and Diabetic Education  Pure hyperglyceridemia- His triglycerides are very high. I have encouraged him to improve his lifestyle modifications and to take icosapent ethyl as directed. -     icosapent Ethyl (VASCEPA) 1 g capsule; Take 2 capsules (2 g total) by mouth 2 (two) times daily. -     Lipid panel; Future -     Lipid panel -     Amb Referral to Nutrition and Diabetic Education  Flu vaccine need -     Flu Vaccine QUAD High Dose(Fluad)  Essential hypertension- See above. -     amLODipine (NORVASC) 10 MG tablet; Take 1 tablet (10 mg total) by mouth daily. -     doxazosin (CARDURA) 2 MG tablet; Take 1 tablet (2 mg total) by mouth daily. -     irbesartan (AVAPRO) 300 MG tablet; Take 1 tablet (300 mg total) by mouth daily. -      potassium chloride (KLOR-CON) 10 MEQ tablet; Take 1 tablet (10 mEq total) by mouth daily. -     spironolactone (ALDACTONE) 25 MG tablet; Take 1 tablet (25 mg total) by mouth daily. -     CBC with Differential/Platelet; Future -     Basic metabolic panel; Future -     Basic metabolic panel -     CBC with Differential/Platelet  Gastroesophageal reflux disease without esophagitis -     dexlansoprazole (DEXILANT) 60 MG capsule; Take 1 capsule (60 mg total) by mouth daily. -     CBC with Differential/Platelet; Future -     CBC with Differential/Platelet  Microalbuminuria -     irbesartan (AVAPRO) 300 MG tablet; Take 1 tablet (300 mg total) by mouth daily.  Hyperlipidemia LDL goal <130 -     rosuvastatin (CRESTOR) 10 MG tablet; Take 1 tablet (10 mg total) by mouth daily. -     Hepatic function panel; Future -     Hepatic function panel  Hypokalemia -     spironolactone (ALDACTONE) 25 MG tablet; Take 1 tablet (25 mg total) by mouth daily. -     Basic metabolic panel; Future -     Basic metabolic panel  COLONIC POLYPS, HX OF -     Ambulatory referral to Gastroenterology  Snoring -     Ambulatory referral to Sleep Studies  Stage 3a chronic kidney disease (HCC) -     Consult to [facility] Care Management  Other orders -     nebivolol (BYSTOLIC) 10 MG tablet; Take 1 tablet (10 mg total) by mouth daily. -     Pneumococcal conjugate vaccine 13-valent -     LDL cholesterol, direct   I have discontinued Jonny Ruiz  T. Junod's omega-3 acid ethyl esters. I have also changed his doxazosin, nebivolol, potassium chloride, and spironolactone. Additionally, I am having him start on Rybelsus. Lastly, I am having him maintain his aspirin, tadalafil, Vitamin D3, amLODipine, dapagliflozin propanediol, dexlansoprazole, irbesartan, rosuvastatin, and icosapent Ethyl.  Meds ordered this encounter  Medications  . amLODipine (NORVASC) 10 MG tablet    Sig: Take 1 tablet (10 mg total) by mouth daily.    Dispense:  90  tablet    Refill:  1  . dapagliflozin propanediol (FARXIGA) 10 MG TABS tablet    Sig: Take 1 tablet (10 mg total) by mouth daily.    Dispense:  90 tablet    Refill:  1  . dexlansoprazole (DEXILANT) 60 MG capsule    Sig: Take 1 capsule (60 mg total) by mouth daily.    Dispense:  90 capsule    Refill:  1  . doxazosin (CARDURA) 2 MG tablet    Sig: Take 1 tablet (2 mg total) by mouth daily.    Dispense:  180 tablet    Refill:  0  . irbesartan (AVAPRO) 300 MG tablet    Sig: Take 1 tablet (300 mg total) by mouth daily.    Dispense:  90 tablet    Refill:  1  . nebivolol (BYSTOLIC) 10 MG tablet    Sig: Take 1 tablet (10 mg total) by mouth daily.    Dispense:  90 tablet    Refill:  1  . potassium chloride (KLOR-CON) 10 MEQ tablet    Sig: Take 1 tablet (10 mEq total) by mouth daily.    Dispense:  180 tablet    Refill:  0  . rosuvastatin (CRESTOR) 10 MG tablet    Sig: Take 1 tablet (10 mg total) by mouth daily.    Dispense:  90 tablet    Refill:  1  . spironolactone (ALDACTONE) 25 MG tablet    Sig: Take 1 tablet (25 mg total) by mouth daily.    Dispense:  90 tablet    Refill:  1  . icosapent Ethyl (VASCEPA) 1 g capsule    Sig: Take 2 capsules (2 g total) by mouth 2 (two) times daily.    Dispense:  360 capsule    Refill:  1  . Semaglutide (RYBELSUS) 3 MG TABS    Sig: Take 1 tablet by mouth daily.    Dispense:  30 tablet    Refill:  0     Follow-up: Return in about 6 weeks (around 06/23/2020).  Sanda Linger, MD

## 2020-05-12 NOTE — Patient Instructions (Signed)

## 2020-05-13 ENCOUNTER — Other Ambulatory Visit: Payer: Self-pay

## 2020-05-13 NOTE — Progress Notes (Signed)
This encounter was created in error - please disregard.

## 2020-05-13 NOTE — Patient Outreach (Signed)
[facility]) Care Management  05/13/2020  Tim Decker Jun 27, 1955 [id number]   Referral Date: 05/13/20 Referral Source: MD referral Referral Reason:  Possible disease management  Outreach Attempt: Telephone call to patient for follow up after MD referral.  Discussed reason for referral and how patient could benefit from [facility] Care Management program for education and support.  Patient declined stating that he wants to follow up with Dr. Yetta Barre before he makes a decision.  He states follow up in about 3 weeks.  Advised patient that CM would send letter and brochure for him to review and call back if he changes his mind.    Plan:  RN CM will send letter and close case.     Bary Leriche, RN, MSN [facility] Care Management Care Management Coordinator Direct Line [phone] Toll Free: [phone]  Fax: [phone]

## 2020-05-17 ENCOUNTER — Telehealth: Payer: Self-pay | Admitting: Internal Medicine

## 2020-05-17 NOTE — Telephone Encounter (Signed)
   Patient report medication too costly dapagliflozin propanediol (FARXIGA) 10 MG TABS tablet $542 dexlansoprazole (DEXILANT) 60 MG capsule $300 nebivolol (BYSTOLIC) 10 MG tablet $131 icosapent Ethyl (VASCEPA) 1 g capsule $900

## 2020-05-18 DIAGNOSIS — Z20822 Contact with and (suspected) exposure to covid-19: Secondary | ICD-10-CM | POA: Diagnosis not present

## 2020-05-18 DIAGNOSIS — Z03818 Encounter for observation for suspected exposure to other biological agents ruled out: Secondary | ICD-10-CM | POA: Diagnosis not present

## 2020-05-19 NOTE — Telephone Encounter (Signed)
Can you help with this?

## 2020-05-20 ENCOUNTER — Encounter: Payer: Self-pay | Admitting: Internal Medicine

## 2020-06-04 NOTE — Telephone Encounter (Signed)
Contacted patient to discuss high cost medications.  Tim Decker - medication is Tier 4 with [employer], per patient-reported annual income < $40,000 he should qualify for [place] patient assistance. Will pursue, and patient will come to office to sign paperwork.  Vascepa - patient qualifies for [employer]. [employer] mail order will not accept copay cards so medication was transferred to [facility] and copay info was provided:  BIN: [id number]  GRP: [id number]  PCN: [id number]  ID: [id number]  [name] approved 05/05/20 - 05/04/21  Dexilant - med is Tier 4, patient is willing to use other PPIs. Per [facility] formulary omeprazole and pantoprazole are both Tier 1 ($0 copay through mail). Will collaborate with PCP to switch to omeprazole 40 mg.  Bystolic (nebivolol) is Tier 3 with [employer] ($131 per 90 days). Pt reports as long as this is the only high-cost medication he can afford it.  Patient voiced understanding of the above and is appreciative of help. Denies further issues.

## 2020-06-07 ENCOUNTER — Other Ambulatory Visit: Payer: Self-pay | Admitting: Internal Medicine

## 2020-06-07 DIAGNOSIS — K219 Gastro-esophageal reflux disease without esophagitis: Secondary | ICD-10-CM

## 2020-06-07 MED ORDER — OMEPRAZOLE 40 MG PO CPDR: 40.0000 mg | DELAYED_RELEASE_CAPSULE | Freq: Every day | ORAL | 1 refills | Status: DC

## 2020-06-15 ENCOUNTER — Other Ambulatory Visit: Payer: Self-pay | Admitting: Internal Medicine

## 2020-06-15 DIAGNOSIS — E1121 Type 2 diabetes mellitus with diabetic nephropathy: Secondary | ICD-10-CM

## 2020-06-15 MED ORDER — RYBELSUS 7 MG PO TABS: 1.0000 | ORAL_TABLET | Freq: Every day | ORAL | 0 refills | Status: DC

## 2020-06-23 ENCOUNTER — Other Ambulatory Visit: Payer: Self-pay | Admitting: Internal Medicine

## 2020-06-23 DIAGNOSIS — E1121 Type 2 diabetes mellitus with diabetic nephropathy: Secondary | ICD-10-CM

## 2020-11-17 ENCOUNTER — Other Ambulatory Visit: Payer: Self-pay | Admitting: Internal Medicine

## 2020-11-17 DIAGNOSIS — E876 Hypokalemia: Secondary | ICD-10-CM

## 2020-11-17 DIAGNOSIS — K219 Gastro-esophageal reflux disease without esophagitis: Secondary | ICD-10-CM

## 2020-11-17 DIAGNOSIS — E1121 Type 2 diabetes mellitus with diabetic nephropathy: Secondary | ICD-10-CM

## 2020-11-17 DIAGNOSIS — R809 Proteinuria, unspecified: Secondary | ICD-10-CM

## 2020-11-17 DIAGNOSIS — I119 Hypertensive heart disease without heart failure: Secondary | ICD-10-CM

## 2020-11-17 DIAGNOSIS — E785 Hyperlipidemia, unspecified: Secondary | ICD-10-CM

## 2020-11-17 DIAGNOSIS — I10 Essential (primary) hypertension: Secondary | ICD-10-CM

## 2020-11-24 ENCOUNTER — Ambulatory Visit: Payer: Medicare PPO | Admitting: Internal Medicine

## 2020-11-24 ENCOUNTER — Encounter: Payer: Self-pay | Admitting: Internal Medicine

## 2020-11-24 ENCOUNTER — Other Ambulatory Visit: Payer: Self-pay

## 2020-11-24 VITALS — BP 142/84 | HR 78 | Temp 98.0°F | Resp 16 | Ht 69.0 in | Wt 242.0 lb

## 2020-11-24 DIAGNOSIS — R195 Other fecal abnormalities: Secondary | ICD-10-CM

## 2020-11-24 DIAGNOSIS — R809 Proteinuria, unspecified: Secondary | ICD-10-CM

## 2020-11-24 DIAGNOSIS — R9431 Abnormal electrocardiogram [ECG] [EKG]: Secondary | ICD-10-CM

## 2020-11-24 DIAGNOSIS — E785 Hyperlipidemia, unspecified: Secondary | ICD-10-CM | POA: Diagnosis not present

## 2020-11-24 DIAGNOSIS — I10 Essential (primary) hypertension: Secondary | ICD-10-CM

## 2020-11-24 DIAGNOSIS — N1831 Chronic kidney disease, stage 3a: Secondary | ICD-10-CM | POA: Diagnosis not present

## 2020-11-24 DIAGNOSIS — E781 Pure hyperglyceridemia: Secondary | ICD-10-CM

## 2020-11-24 DIAGNOSIS — E1121 Type 2 diabetes mellitus with diabetic nephropathy: Secondary | ICD-10-CM

## 2020-11-24 DIAGNOSIS — E876 Hypokalemia: Secondary | ICD-10-CM

## 2020-11-24 DIAGNOSIS — Z0001 Encounter for general adult medical examination with abnormal findings: Secondary | ICD-10-CM | POA: Diagnosis not present

## 2020-11-24 DIAGNOSIS — I119 Hypertensive heart disease without heart failure: Secondary | ICD-10-CM

## 2020-11-24 DIAGNOSIS — K219 Gastro-esophageal reflux disease without esophagitis: Secondary | ICD-10-CM

## 2020-11-24 DIAGNOSIS — N400 Enlarged prostate without lower urinary tract symptoms: Secondary | ICD-10-CM | POA: Diagnosis not present

## 2020-11-24 DIAGNOSIS — Z720 Tobacco use: Secondary | ICD-10-CM | POA: Insufficient documentation

## 2020-11-24 DIAGNOSIS — Z23 Encounter for immunization: Secondary | ICD-10-CM

## 2020-11-24 LAB — BASIC METABOLIC PANEL
BUN: 23 mg/dL (ref 6–23)
CO2: 25 mEq/L (ref 19–32)
Calcium: 9.6 mg/dL (ref 8.4–10.5)
Chloride: 103 mEq/L (ref 96–112)
Creatinine, Ser: 1.83 mg/dL — ABNORMAL HIGH (ref 0.40–1.50)
GFR: 38.2 mL/min — ABNORMAL LOW (ref >60.00)
Glucose, Bld: 115 mg/dL — ABNORMAL HIGH (ref 70–99)
Potassium: 4.8 mEq/L (ref 3.5–5.1)
Sodium: 139 mEq/L (ref 135–145)

## 2020-11-24 LAB — URINALYSIS, ROUTINE W REFLEX MICROSCOPIC
Bilirubin Urine: NEGATIVE
Hgb urine dipstick: NEGATIVE
Ketones, ur: NEGATIVE
Leukocytes,Ua: NEGATIVE
Nitrite: NEGATIVE
RBC / HPF: NONE SEEN (ref 0–?)
Specific Gravity, Urine: 1.015 (ref 1.000–1.030)
Total Protein, Urine: 100 — AB
Urine Glucose: >=1000 — AB
Urobilinogen, UA: 0.2 (ref 0.0–1.0)
WBC, UA: NONE SEEN (ref 0–?)
pH: 6 (ref 5.0–8.0)

## 2020-11-24 LAB — CBC WITH DIFFERENTIAL/PLATELET
Basophils Absolute: 0 10*3/uL (ref 0.0–0.1)
Basophils Relative: 0.5 % (ref 0.0–3.0)
Eosinophils Absolute: 0.2 10*3/uL (ref 0.0–0.7)
Eosinophils Relative: 2.8 % (ref 0.0–5.0)
HCT: 47.4 % (ref 39.0–52.0)
Hemoglobin: 16 g/dL (ref 13.0–17.0)
Lymphocytes Relative: 16 % (ref 12.0–46.0)
Lymphs Abs: 1.2 10*3/uL (ref 0.7–4.0)
MCHC: 33.7 g/dL (ref 30.0–36.0)
MCV: 91.6 fl (ref 78.0–100.0)
Monocytes Absolute: 0.7 10*3/uL (ref 0.1–1.0)
Monocytes Relative: 9.2 % (ref 3.0–12.0)
Neutro Abs: 5.5 10*3/uL (ref 1.4–7.7)
Neutrophils Relative %: 71.5 % (ref 43.0–77.0)
Platelets: 227 10*3/uL (ref 150.0–400.0)
RBC: 5.18 Mil/uL (ref 4.22–5.81)
RDW: 15.2 % (ref 11.5–15.5)
WBC: 7.7 10*3/uL (ref 4.0–10.5)

## 2020-11-24 LAB — LIPID PANEL
Cholesterol: 153 mg/dL (ref 0–200)
HDL: 26.9 mg/dL — ABNORMAL LOW (ref >39.00)
Total CHOL/HDL Ratio: 6
Triglycerides: 723 mg/dL — ABNORMAL HIGH (ref 0.0–149.0)

## 2020-11-24 LAB — BRAIN NATRIURETIC PEPTIDE: Pro B Natriuretic peptide (BNP): 24 pg/mL (ref 0.0–100.0)

## 2020-11-24 LAB — HEPATIC FUNCTION PANEL
ALT: 36 U/L (ref 0–53)
AST: 25 U/L (ref 0–37)
Albumin: 4.3 g/dL (ref 3.5–5.2)
Alkaline Phosphatase: 53 U/L (ref 39–117)
Bilirubin, Direct: 0.1 mg/dL (ref 0.0–0.3)
Total Bilirubin: 0.5 mg/dL (ref 0.2–1.2)
Total Protein: 6.6 g/dL (ref 6.0–8.3)

## 2020-11-24 LAB — PSA: PSA: 1.14 ng/mL (ref 0.10–4.00)

## 2020-11-24 LAB — LDL CHOLESTEROL, DIRECT: Direct LDL: 51 mg/dL

## 2020-11-24 LAB — TSH: TSH: 2.21 u[IU]/mL (ref 0.35–5.50)

## 2020-11-24 LAB — HEMOGLOBIN A1C: Hgb A1c MFr Bld: 6.7 % — ABNORMAL HIGH (ref 4.6–6.5)

## 2020-11-24 LAB — MICROALBUMIN / CREATININE URINE RATIO
Creatinine,U: 108.2 mg/dL
Microalb Creat Ratio: 34.2 mg/g — ABNORMAL HIGH (ref 0.0–30.0)
Microalb, Ur: 37 mg/dL — ABNORMAL HIGH (ref 0.0–1.9)

## 2020-11-24 MED ORDER — DOXAZOSIN MESYLATE 2 MG PO TABS: 2.0000 mg | ORAL_TABLET | Freq: Every day | ORAL | 0 refills | Status: DC

## 2020-11-24 MED ORDER — DAPAGLIFLOZIN PROPANEDIOL 10 MG PO TABS: 10.0000 mg | ORAL_TABLET | Freq: Every day | ORAL | 1 refills | Status: DC

## 2020-11-24 MED ORDER — SHINGRIX 50 MCG/0.5ML IM SUSR: 0.5000 mL | Freq: Once | INTRAMUSCULAR | 1 refills | Status: AC

## 2020-11-24 MED ORDER — AMLODIPINE BESYLATE 10 MG PO TABS: 10.0000 mg | ORAL_TABLET | Freq: Every day | ORAL | 1 refills | Status: DC

## 2020-11-24 MED ORDER — SPIRONOLACTONE 25 MG PO TABS: 25.0000 mg | ORAL_TABLET | Freq: Every day | ORAL | 1 refills | Status: DC

## 2020-11-24 MED ORDER — ICOSAPENT ETHYL 1 G PO CAPS: 2.0000 g | ORAL_CAPSULE | Freq: Two times a day (BID) | ORAL | 1 refills | Status: DC

## 2020-11-24 MED ORDER — OMEPRAZOLE 40 MG PO CPDR: 40.0000 mg | DELAYED_RELEASE_CAPSULE | Freq: Every day | ORAL | 1 refills | Status: DC

## 2020-11-24 MED ORDER — IRBESARTAN 300 MG PO TABS: 300.0000 mg | ORAL_TABLET | Freq: Every day | ORAL | 1 refills | Status: DC

## 2020-11-24 MED ORDER — BOOSTRIX 5-2.5-18.5 LF-MCG/0.5 IM SUSP: 0.5000 mL | Freq: Once | INTRAMUSCULAR | 0 refills | Status: AC

## 2020-11-24 MED ORDER — ROSUVASTATIN CALCIUM 10 MG PO TABS: 10.0000 mg | ORAL_TABLET | Freq: Every day | ORAL | 1 refills | Status: DC

## 2020-11-24 MED ORDER — NEBIVOLOL HCL 10 MG PO TABS: 10.0000 mg | ORAL_TABLET | Freq: Every day | ORAL | 1 refills | Status: DC

## 2020-11-24 NOTE — Patient Instructions (Signed)
Health Maintenance, Male Adopting a healthy lifestyle and getting preventive care are important in promoting health and wellness. Ask your health care provider about: The right schedule for you to have regular tests and exams. Things you can do on your own to prevent diseases and keep yourself healthy. What should I know about diet, weight, and exercise? Eat a healthy diet  Eat a diet that includes plenty of vegetables, fruits, low-fat dairy products, and lean protein. Do not eat a lot of foods that are high in solid fats, added sugars, or sodium.  Maintain a healthy weight Body mass index (BMI) is a measurement that can be used to identify possible weight problems. It estimates body fat based on height and weight. Your health care provider can help determine your BMI and help you achieve or maintain ahealthy weight. Get regular exercise Get regular exercise. This is one of the most important things you can do for your health. Most adults should: Exercise for at least 150 minutes each week. The exercise should increase your heart rate and make you sweat (moderate-intensity exercise). Do strengthening exercises at least twice a week. This is in addition to the moderate-intensity exercise. Spend less time sitting. Even light physical activity can be beneficial. Watch cholesterol and blood lipids Have your blood tested for lipids and cholesterol at 66 years of age, then havethis test every 5 years. You may need to have your cholesterol levels checked more often if: Your lipid or cholesterol levels are high. You are older than 66 years of age. You are at high risk for heart disease. What should I know about cancer screening? Many types of cancers can be detected early and may often be prevented. Depending on your health history and family history, you may need to have cancer screening at various ages. This may include screening for: Colorectal cancer. Prostate cancer. Skin cancer. Lung  cancer. What should I know about heart disease, diabetes, and high blood pressure? Blood pressure and heart disease High blood pressure causes heart disease and increases the risk of stroke. This is more likely to develop in people who have high blood pressure readings, are of African descent, or are overweight. Talk with your health care provider about your target blood pressure readings. Have your blood pressure checked: Every 3-5 years if you are 18-39 years of age. Every year if you are 40 years old or older. If you are between the ages of 65 and 75 and are a current or former smoker, ask your health care provider if you should have a one-time screening for abdominal aortic aneurysm (AAA). Diabetes Have regular diabetes screenings. This checks your fasting blood sugar level. Have the screening done: Once every three years after age 45 if you are at a normal weight and have a low risk for diabetes. More often and at a younger age if you are overweight or have a high risk for diabetes. What should I know about preventing infection? Hepatitis B If you have a higher risk for hepatitis B, you should be screened for this virus. Talk with your health care provider to find out if you are at risk forhepatitis B infection. Hepatitis C Blood testing is recommended for: Everyone born from 1945 through 1965. Anyone with known risk factors for hepatitis C. Sexually transmitted infections (STIs) You should be screened each year for STIs, including gonorrhea and chlamydia, if: You are sexually active and are younger than 66 years of age. You are older than 66 years of age   and your health care provider tells you that you are at risk for this type of infection. Your sexual activity has changed since you were last screened, and you are at increased risk for chlamydia or gonorrhea. Ask your health care provider if you are at risk. Ask your health care provider about whether you are at high risk for HIV.  Your health care provider may recommend a prescription medicine to help prevent HIV infection. If you choose to take medicine to prevent HIV, you should first get tested for HIV. You should then be tested every 3 months for as long as you are taking the medicine. Follow these instructions at home: Lifestyle Do not use any products that contain nicotine or tobacco, such as cigarettes, e-cigarettes, and chewing tobacco. If you need help quitting, ask your health care provider. Do not use street drugs. Do not share needles. Ask your health care provider for help if you need support or information about quitting drugs. Alcohol use Do not drink alcohol if your health care provider tells you not to drink. If you drink alcohol: Limit how much you have to 0-2 drinks a day. Be aware of how much alcohol is in your drink. In the [place] one drink equals one 12 oz bottle of beer (355 mL), one 5 oz glass of wine (148 mL), or one 1 oz glass of hard liquor (44 mL). General instructions Schedule regular health, dental, and eye exams. Stay current with your vaccines. Tell your health care provider if: You often feel depressed. You have ever been abused or do not feel safe at home. Summary Adopting a healthy lifestyle and getting preventive care are important in promoting health and wellness. Follow your health care provider's instructions about healthy diet, exercising, and getting tested or screened for diseases. Follow your health care provider's instructions on monitoring your cholesterol and blood pressure. This information is not intended to replace advice given to you by your health care provider. Make sure you discuss any questions you have with your healthcare provider. Document Revised: 03/27/2018 Document Reviewed: 03/27/2018 Elsevier Patient Education  2022 [employer]  

## 2020-11-24 NOTE — Progress Notes (Signed)
Subjective:  Patient ID: Tim Decker, male    DOB: 1955-07-04  Age: 65 y.o. MRN: [id number]  CC: Annual Exam, Diabetes, Hyperlipidemia, and Hypertension  This visit occurred during the SARS-CoV-2 public health emergency.  Safety protocols were in place, including screening questions prior to the visit, additional usage of staff PPE, and extensive cleaning of exam room while observing appropriate contact time as indicated for disinfecting solutions.    HPI Douglass Dunshee presents for a CPX and f/up -   He is very active and regularly experiences shortness of breath.  He denies chest pain, diaphoresis, dizziness, lightheadedness, or worsening edema.  Outpatient Medications Prior to Visit  Medication Sig Dispense Refill   aspirin 81 MG tablet Take 81 mg by mouth daily.     Cholecalciferol (VITAMIN D3) 1.25 MG (50000 UT) CAPS Take 1 capsule by mouth once a week 12 capsule 0   amLODipine (NORVASC) 10 MG tablet Take 1 tablet (10 mg total) by mouth daily. 90 tablet 1   dapagliflozin propanediol (FARXIGA) 10 MG TABS tablet Take 1 tablet (10 mg total) by mouth daily. 90 tablet 1   doxazosin (CARDURA) 2 MG tablet Take 1 tablet (2 mg total) by mouth daily. 180 tablet 0   icosapent Ethyl (VASCEPA) 1 g capsule Take 2 capsules (2 g total) by mouth 2 (two) times daily. 360 capsule 1   irbesartan (AVAPRO) 300 MG tablet Take 1 tablet (300 mg total) by mouth daily. 90 tablet 1   nebivolol (BYSTOLIC) 10 MG tablet Take 1 tablet (10 mg total) by mouth daily. 90 tablet 1   omeprazole (PRILOSEC) 40 MG capsule Take 1 capsule (40 mg total) by mouth daily. 90 capsule 1   potassium chloride (KLOR-CON) 10 MEQ tablet Take 1 tablet (10 mEq total) by mouth daily. 180 tablet 0   rosuvastatin (CRESTOR) 10 MG tablet Take 1 tablet (10 mg total) by mouth daily. 90 tablet 1   Semaglutide (RYBELSUS) 7 MG TABS Take 1 tablet by mouth daily. 90 tablet 0   spironolactone (ALDACTONE) 25 MG tablet Take 1 tablet (25 mg  total) by mouth daily. 90 tablet 1   tadalafil (CIALIS) 20 MG tablet TAKE ONE TABLET BY MOUTH EVERY 3 DAYS AS NEEDED 6 tablet 2   No facility-administered medications prior to visit.    ROS Review of Systems  Constitutional:  Negative for appetite change, chills, diaphoresis, fatigue and fever.  HENT: Negative.    Eyes: Negative.   Respiratory:  Positive for shortness of breath. Negative for cough, chest tightness and wheezing.   Cardiovascular:  Negative for chest pain and leg swelling.  Gastrointestinal:  Negative for abdominal pain, constipation, diarrhea, nausea and vomiting.  Endocrine: Negative.  Negative for polydipsia, polyphagia and polyuria.  Genitourinary: Negative.  Negative for difficulty urinating, scrotal swelling and testicular pain.  Musculoskeletal: Negative.   Skin: Negative.   Neurological:  Negative for dizziness, weakness, light-headedness and headaches.  Hematological:  Negative for adenopathy. Does not bruise/bleed easily.  Psychiatric/Behavioral: Negative.     Objective:  BP (!) 142/84 (BP Location: Left Arm, Patient Position: Sitting, Cuff Size: Large)   Pulse 78   Temp 98 F (36.7 C) (Oral)   Resp 16   Ht 5\' 9"  (1.753 m)   Wt 242 lb (109.8 kg)   SpO2 92%   BMI 35.74 kg/m   BP Readings from Last 3 Encounters:  11/24/20 (!) 142/84  05/12/20 (!) 160/102  09/25/19 130/80    Wt Readings from  Last 3 Encounters:  11/24/20 242 lb (109.8 kg)  05/12/20 246 lb (111.6 kg)  09/25/19 228 lb 2 oz (103.5 kg)    Physical Exam Vitals reviewed.  Constitutional:      Appearance: Normal appearance.  HENT:     Nose: Nose normal.     Mouth/Throat:     Mouth: Mucous membranes are moist.  Eyes:     Conjunctiva/sclera: Conjunctivae normal.  Cardiovascular:     Rate and Rhythm: Normal rate and regular rhythm.     Heart sounds: No murmur heard.    Comments: EKG- NSR, 72 bpm LAD is not new Voltage loss and ? Q in V6 is new LVH in V3 and V4 Pulmonary:      Effort: Pulmonary effort is normal.     Breath sounds: No stridor. No wheezing, rhonchi or rales.  Abdominal:     General: Abdomen is protuberant. Bowel sounds are normal. There is no distension.     Palpations: Abdomen is soft. There is no hepatomegaly, splenomegaly or mass.     Tenderness: There is no abdominal tenderness.     Hernia: There is no hernia in the left inguinal area or right inguinal area.  Genitourinary:    Pubic Area: No rash.      Penis: Normal and circumcised.      Testes: Normal.     Epididymis:     Right: Normal.     Left: Normal.     Prostate: Normal. Not enlarged, not tender and no nodules present.     Rectum: Normal. Guaiac result negative. No mass, tenderness, anal fissure, external hemorrhoid or internal hemorrhoid. Normal anal tone.  Musculoskeletal:     Cervical back: Neck supple.     Right lower leg: Edema (trace pitting) present.     Left lower leg: Edema (trace pitting) present.  Lymphadenopathy:     Cervical: No cervical adenopathy.     Lower Body: No right inguinal adenopathy. No left inguinal adenopathy.  Skin:    General: Skin is warm and dry.     Findings: No rash.  Neurological:     General: No focal deficit present.     Mental Status: He is alert.  Psychiatric:        Mood and Affect: Mood normal.        Behavior: Behavior normal.    Lab Results  Component Value Date   WBC 7.7 11/24/2020   HGB 16.0 11/24/2020   HCT 47.4 11/24/2020   PLT 227.0 11/24/2020   GLUCOSE 115 (H) 11/24/2020   CHOL 153 11/24/2020   TRIG (H) 11/24/2020    723.0 Triglyceride is over 400; calculations on Lipids are invalid.   HDL 26.90 (L) 11/24/2020   LDLDIRECT 51.0 11/24/2020   LDLCALC 93 01/15/2014   ALT 36 11/24/2020   AST 25 11/24/2020   NA 139 11/24/2020   K 4.8 11/24/2020   CL 103 11/24/2020   CREATININE 1.83 (H) 11/24/2020   BUN 23 11/24/2020   CO2 25 11/24/2020   TSH 2.21 11/24/2020   PSA 1.14 11/24/2020   HGBA1C 6.7 (H) 11/24/2020    MICROALBUR 37.0 (H) 11/24/2020    DG Chest 2 View  Result Date: 02/10/2019 CLINICAL DATA:  Hypertension. EXAM: CHEST - 2 VIEW COMPARISON:  None. FINDINGS: The heart size and mediastinal contours are within normal limits. Both lungs are clear. The visualized skeletal structures are unremarkable. IMPRESSION: No active cardiopulmonary disease. Electronically Signed   By: Zenda Alpers.D.  On: 02/10/2019 09:31    Assessment & Plan:   Elvie was seen today for annual exam, diabetes, hyperlipidemia and hypertension.  Diagnoses and all orders for this visit:  Essential hypertension- His blood pressure is adequately well controlled. -     CBC with Differential/Platelet; Future -     Basic metabolic panel; Future -     TSH; Future -     Urinalysis, Routine w reflex microscopic; Future -     EKG 12-Lead -     Urinalysis, Routine w reflex microscopic -     TSH -     Basic metabolic panel -     CBC with Differential/Platelet -     amLODipine (NORVASC) 10 MG tablet; Take 1 tablet (10 mg total) by mouth daily. -     nebivolol (BYSTOLIC) 10 MG tablet; Take 1 tablet (10 mg total) by mouth daily. -     spironolactone (ALDACTONE) 25 MG tablet; Take 1 tablet (25 mg total) by mouth daily. -     irbesartan (AVAPRO) 300 MG tablet; Take 1 tablet (300 mg total) by mouth daily. -     doxazosin (CARDURA) 2 MG tablet; Take 1 tablet (2 mg total) by mouth daily.  Hypertensive left ventricular hypertrophy, without heart failure- His blood pressure is well controlled.  His fluid status is stable. -     TSH; Future -     Urinalysis, Routine w reflex microscopic; Future -     Troponin I (High Sensitivity); Future -     Brain natriuretic peptide; Future -     Brain natriuretic peptide -     Troponin I (High Sensitivity) -     Urinalysis, Routine w reflex microscopic -     TSH -     amLODipine (NORVASC) 10 MG tablet; Take 1 tablet (10 mg total) by mouth daily. -     nebivolol (BYSTOLIC) 10 MG tablet; Take 1  tablet (10 mg total) by mouth daily. -     spironolactone (ALDACTONE) 25 MG tablet; Take 1 tablet (25 mg total) by mouth daily. -     irbesartan (AVAPRO) 300 MG tablet; Take 1 tablet (300 mg total) by mouth daily. -     doxazosin (CARDURA) 2 MG tablet; Take 1 tablet (2 mg total) by mouth daily.  BPH without obstruction/lower urinary tract symptoms- His PSA is reassuring. -     PSA; Future -     Urinalysis, Routine w reflex microscopic; Future -     Urinalysis, Routine w reflex microscopic -     PSA -     doxazosin (CARDURA) 2 MG tablet; Take 1 tablet (2 mg total) by mouth daily.  Microalbuminuria- Will continue the ARB. -     Microalbumin / creatinine urine ratio; Future -     Microalbumin / creatinine urine ratio -     irbesartan (AVAPRO) 300 MG tablet; Take 1 tablet (300 mg total) by mouth daily.  Hyperlipidemia LDL goal <130- LDL goal achieved. Doing well on the statin  -     Lipid panel; Future -     Hepatic function panel; Future -     Hepatic function panel -     Lipid panel -     rosuvastatin (CRESTOR) 10 MG tablet; Take 1 tablet (10 mg total) by mouth daily.  Stage 3a chronic kidney disease (HCC)- Will continue to address risk factor modifications. -     Basic metabolic panel; Future -     Microalbumin /  creatinine urine ratio; Future -     Microalbumin / creatinine urine ratio -     Basic metabolic panel -     dapagliflozin propanediol (FARXIGA) 10 MG TABS tablet; Take 1 tablet (10 mg total) by mouth daily.  Diabetes mellitus with proteinuric diabetic nephropathy (HCC)- Will continue the SGLT2 inhibitor. -     Basic metabolic panel; Future -     Microalbumin / creatinine urine ratio; Future -     Hemoglobin A1c; Future -     HM Diabetes Foot Exam -     Ambulatory referral to Ophthalmology -     Hemoglobin A1c -     Microalbumin / creatinine urine ratio -     Basic metabolic panel -     icosapent Ethyl (VASCEPA) 1 g capsule; Take 2 capsules (2 g total) by mouth 2 (two)  times daily. -     dapagliflozin propanediol (FARXIGA) 10 MG TABS tablet; Take 1 tablet (10 mg total) by mouth daily. -     irbesartan (AVAPRO) 300 MG tablet; Take 1 tablet (300 mg total) by mouth daily.  Pure hyperglyceridemia- His triglycerides remain elevated.  Will restart icosapent ethyl and I have asked him to improve his lifestyle modifications. -     icosapent Ethyl (VASCEPA) 1 g capsule; Take 2 capsules (2 g total) by mouth 2 (two) times daily.  Gastroesophageal reflux disease without esophagitis -     omeprazole (PRILOSEC) 40 MG capsule; Take 1 capsule (40 mg total) by mouth daily.  Hypokalemia -     spironolactone (ALDACTONE) 25 MG tablet; Take 1 tablet (25 mg total) by mouth daily.  Positive colorectal cancer screening using Cologuard test -     Ambulatory referral to Gastroenterology  Need for prophylactic vaccination with combined diphtheria-tetanus-pertussis (DTP) vaccine -     Tdap (BOOSTRIX) 5-2.5-18.5 LF-MCG/0.5 injection; Inject 0.5 mLs into the muscle once for 1 dose.  Need for shingles vaccine -     Zoster Vaccine Adjuvanted [facility](SHINGRIX) injection; Inject 0.5 mLs into the muscle once for 1 dose.  Tobacco abuse -     Ambulatory Referral for Lung Cancer Scre  Abnormal electrocardiogram (ECG) (EKG)- He has had a previous evaluation with Lexiscan.  He has a subtle change on his EKG.  His labs are reassuring.  I have asked him to follow-up with cardiology. -     Ambulatory referral to Cardiology -     Troponin I (High Sensitivity); Future -     Brain natriuretic peptide; Future -     Brain natriuretic peptide -     Troponin I (High Sensitivity)  Other orders -     LDL cholesterol, direct  I have discontinued [facility]is T. Estill's tadalafil, potassium chloride, and Rybelsus. I am also having him start on Boostrix and Shingrix. Additionally, I am having him maintain his aspirin, Vitamin D3, amLODipine, nebivolol, icosapent Ethyl, dapagliflozin propanediol, rosuvastatin,  omeprazole, spironolactone, irbesartan, and doxazosin.  Meds ordered this encounter  Medications   Tdap (BOOSTRIX) 5-2.5-18.5 LF-MCG/0.5 injection    Sig: Inject 0.5 mLs into the muscle once for 1 dose.    Dispense:  0.5 mL    Refill:  0   Zoster Vaccine Adjuvanted [facility](SHINGRIX) injection    Sig: Inject 0.5 mLs into the muscle once for 1 dose.    Dispense:  0.5 mL    Refill:  1   amLODipine (NORVASC) 10 MG tablet    Sig: Take 1 tablet (10 mg total) by mouth daily.  Dispense:  90 tablet    Refill:  1   nebivolol (BYSTOLIC) 10 MG tablet    Sig: Take 1 tablet (10 mg total) by mouth daily.    Dispense:  90 tablet    Refill:  1   icosapent Ethyl (VASCEPA) 1 g capsule    Sig: Take 2 capsules (2 g total) by mouth 2 (two) times daily.    Dispense:  360 capsule    Refill:  1   dapagliflozin propanediol (FARXIGA) 10 MG TABS tablet    Sig: Take 1 tablet (10 mg total) by mouth daily.    Dispense:  90 tablet    Refill:  1   rosuvastatin (CRESTOR) 10 MG tablet    Sig: Take 1 tablet (10 mg total) by mouth daily.    Dispense:  90 tablet    Refill:  1   omeprazole (PRILOSEC) 40 MG capsule    Sig: Take 1 capsule (40 mg total) by mouth daily.    Dispense:  90 capsule    Refill:  1   spironolactone (ALDACTONE) 25 MG tablet    Sig: Take 1 tablet (25 mg total) by mouth daily.    Dispense:  90 tablet    Refill:  1   irbesartan (AVAPRO) 300 MG tablet    Sig: Take 1 tablet (300 mg total) by mouth daily.    Dispense:  90 tablet    Refill:  1   doxazosin (CARDURA) 2 MG tablet    Sig: Take 1 tablet (2 mg total) by mouth daily.    Dispense:  180 tablet    Refill:  0      Follow-up: Return in about 3 months (around 02/24/2021).  Sanda Linger, MD

## 2020-11-26 ENCOUNTER — Encounter: Payer: Self-pay | Admitting: Internal Medicine

## 2020-11-26 DIAGNOSIS — Z0001 Encounter for general adult medical examination with abnormal findings: Secondary | ICD-10-CM | POA: Insufficient documentation

## 2020-11-26 NOTE — Assessment & Plan Note (Signed)
Exam completed Labs reviewed Vaccines reviewed and updated Cancer screenings addressed Patient education was given. 

## 2020-12-22 ENCOUNTER — Other Ambulatory Visit: Payer: Self-pay | Admitting: Internal Medicine

## 2020-12-22 DIAGNOSIS — E781 Pure hyperglyceridemia: Secondary | ICD-10-CM

## 2020-12-22 DIAGNOSIS — E1121 Type 2 diabetes mellitus with diabetic nephropathy: Secondary | ICD-10-CM

## 2020-12-23 IMAGING — CR DG CHEST 2V
2 series · 2 of 2 positions shown · non-contrast
Comparison: None.

CLINICAL DATA: Hypertension.

EXAM:
CHEST - 2 VIEW

[w chest pa]
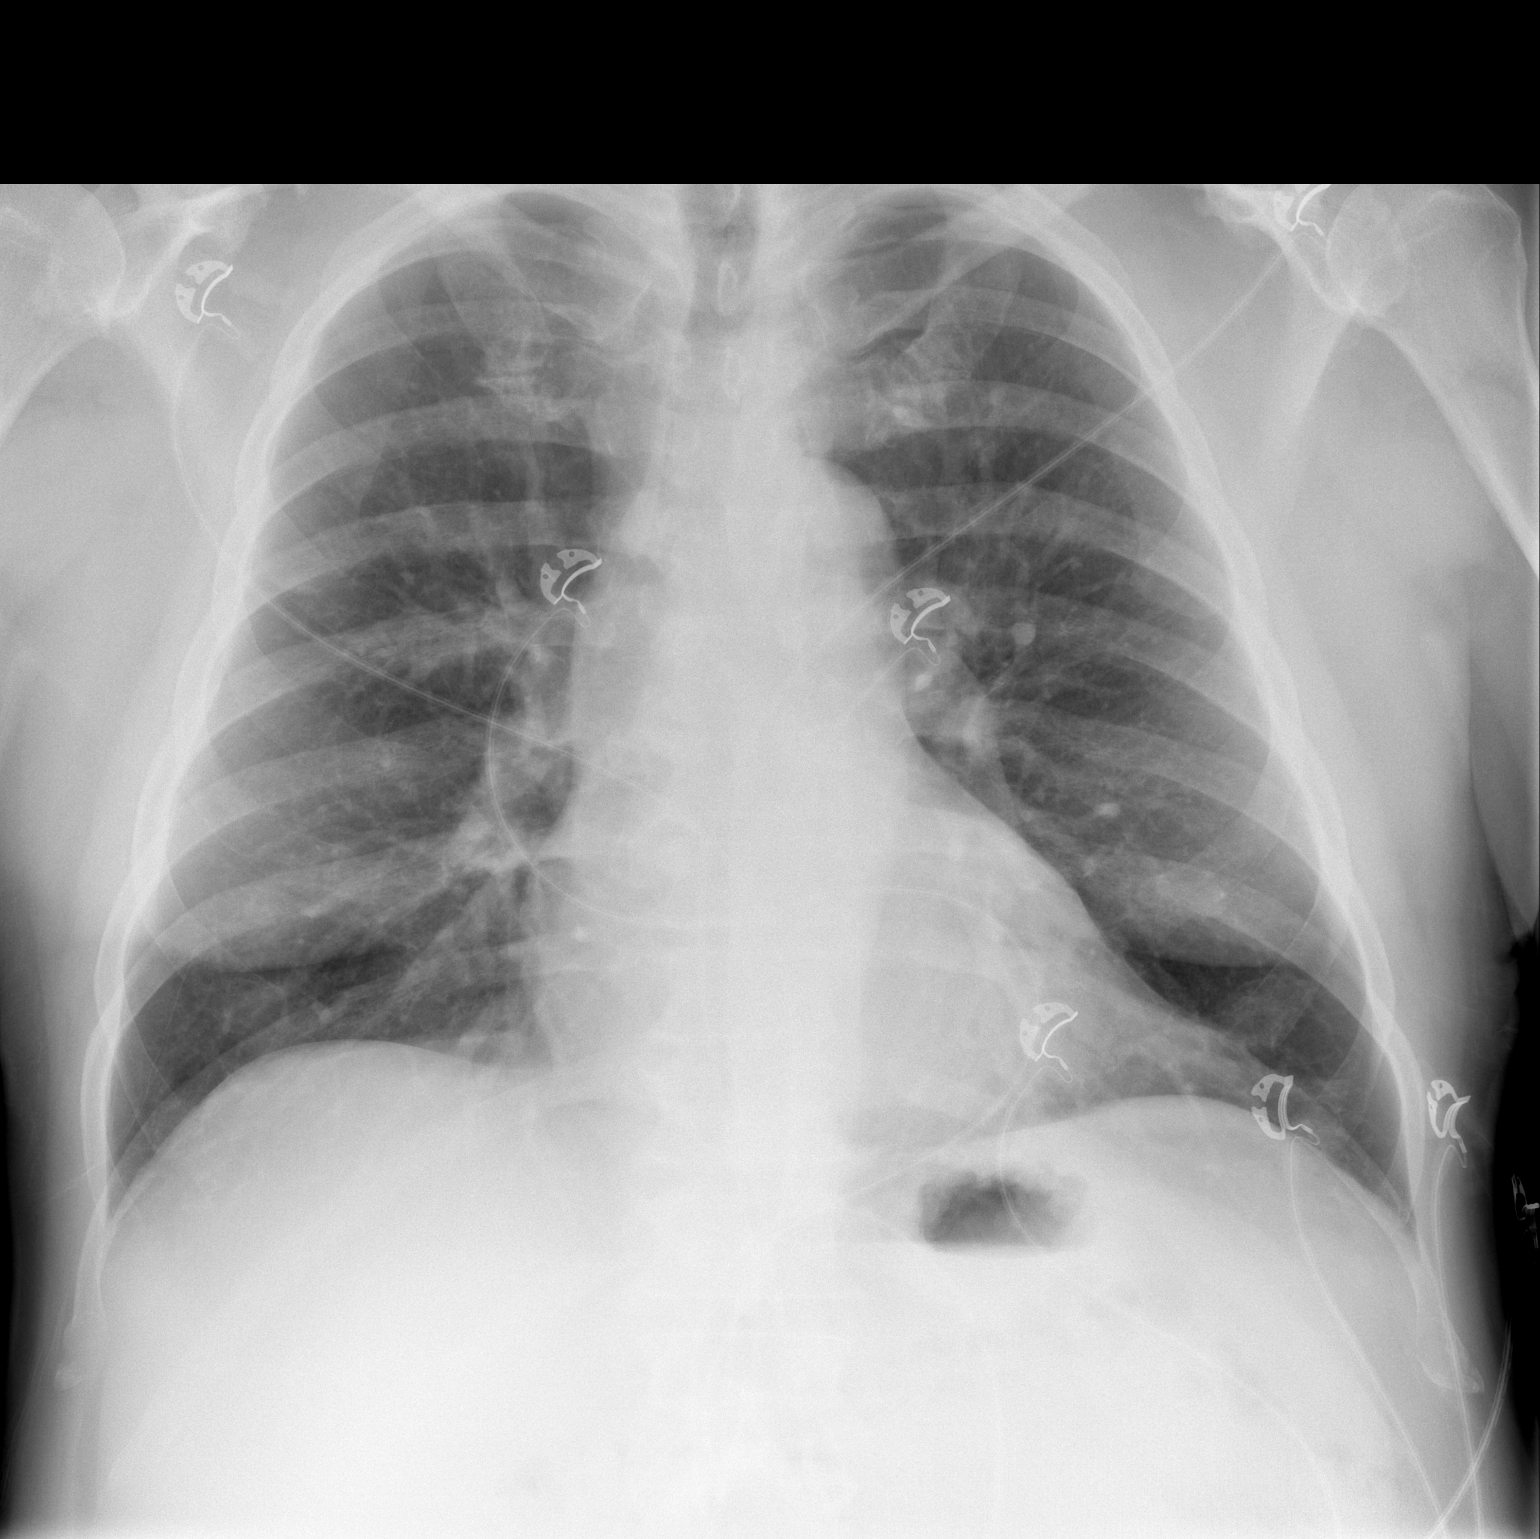

[w chest lat]
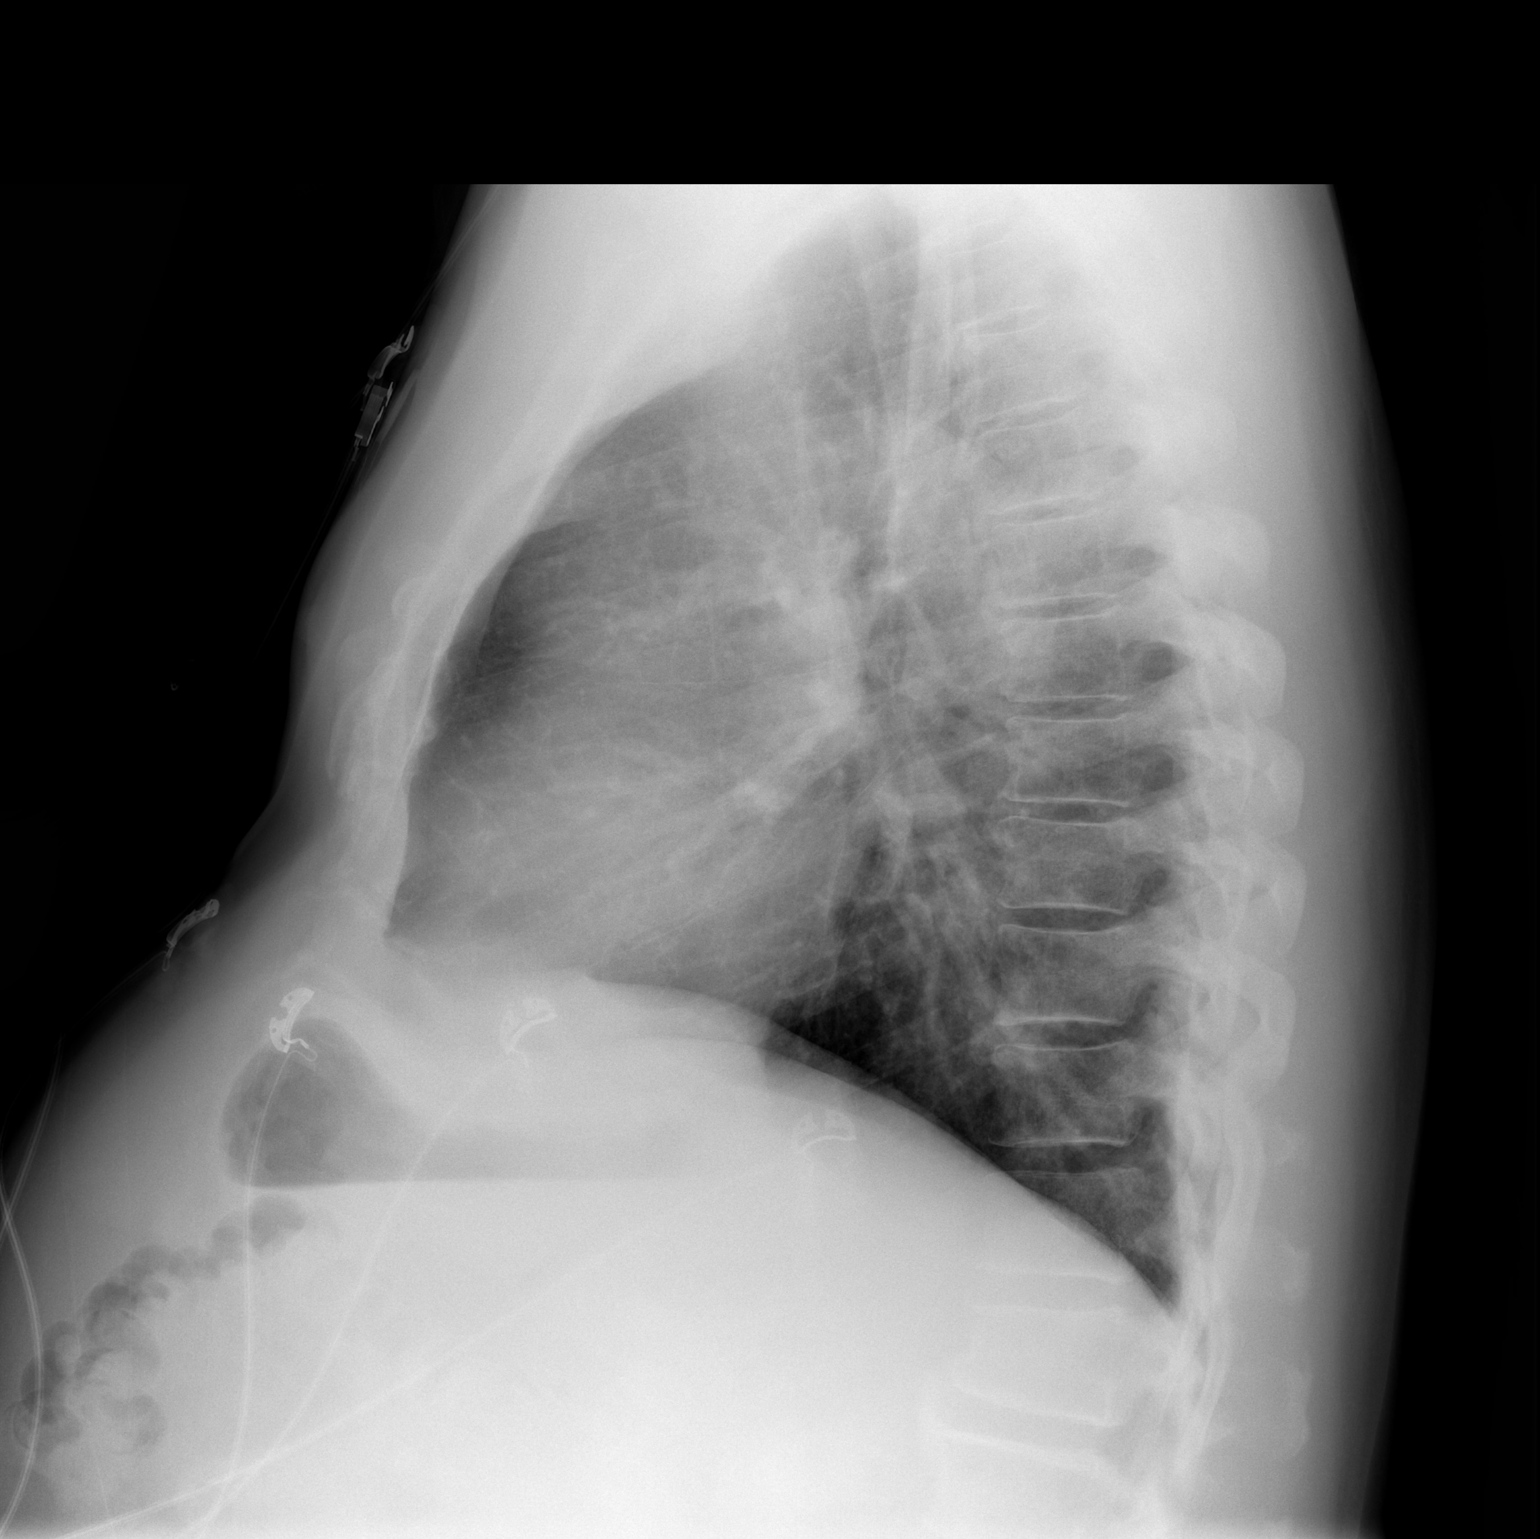

[2 of 2 positions shown; findings below may reference images not displayed]

FINDINGS: The heart size and mediastinal contours are within normal limits.
Both lungs are clear. The visualized skeletal structures are
unremarkable.
IMPRESSION: No active cardiopulmonary disease.

## 2021-02-08 ENCOUNTER — Other Ambulatory Visit: Payer: Self-pay

## 2021-02-08 ENCOUNTER — Ambulatory Visit (HOSPITAL_BASED_OUTPATIENT_CLINIC_OR_DEPARTMENT_OTHER): Payer: Medicare PPO | Admitting: Cardiology

## 2021-02-08 ENCOUNTER — Encounter (HOSPITAL_BASED_OUTPATIENT_CLINIC_OR_DEPARTMENT_OTHER): Payer: Self-pay | Admitting: Cardiology

## 2021-02-08 VITALS — BP 122/78 | HR 74 | Ht 69.0 in | Wt 244.2 lb

## 2021-02-08 DIAGNOSIS — I429 Cardiomyopathy, unspecified: Secondary | ICD-10-CM

## 2021-02-08 DIAGNOSIS — E1169 Type 2 diabetes mellitus with other specified complication: Secondary | ICD-10-CM

## 2021-02-08 DIAGNOSIS — N1832 Chronic kidney disease, stage 3b: Secondary | ICD-10-CM

## 2021-02-08 DIAGNOSIS — F17210 Nicotine dependence, cigarettes, uncomplicated: Secondary | ICD-10-CM | POA: Diagnosis not present

## 2021-02-08 DIAGNOSIS — Z716 Tobacco abuse counseling: Secondary | ICD-10-CM

## 2021-02-08 DIAGNOSIS — Z713 Dietary counseling and surveillance: Secondary | ICD-10-CM

## 2021-02-08 DIAGNOSIS — Z7182 Exercise counseling: Secondary | ICD-10-CM

## 2021-02-08 DIAGNOSIS — Z7189 Other specified counseling: Secondary | ICD-10-CM

## 2021-02-08 DIAGNOSIS — Z720 Tobacco use: Secondary | ICD-10-CM | POA: Diagnosis not present

## 2021-02-08 DIAGNOSIS — E669 Obesity, unspecified: Secondary | ICD-10-CM | POA: Insufficient documentation

## 2021-02-08 DIAGNOSIS — I10 Essential (primary) hypertension: Secondary | ICD-10-CM

## 2021-02-08 DIAGNOSIS — E782 Mixed hyperlipidemia: Secondary | ICD-10-CM | POA: Diagnosis not present

## 2021-02-08 NOTE — Progress Notes (Signed)
Cardiology Office Note:    Date:  02/09/2021   ID:  [name], DOB 1955/07/29, MRN [id number]  PCP:  Etta Grandchild, MD  Cardiologist:  Jodelle Red, MD  Referring MD: Etta Grandchild, MD   CC: new patient evaluation for abnormal ECG   History of Present Illness:    Jarreth Sidberry is a 65 y.o. male with a hx of hyperlipidemia, hypertension, and pre-diabetes, who is seen as a new consult at the request of Etta Grandchild, MD for the evaluation and management of abnormal electrocardiogram results.  He was last seen by Dr. Yetta Barre 11/24/2020. EKG at that visit showed NSR, 72 bpm, LAD, Voltage loss and ? Q in V6, LVH in V3 and V4. He was referred to cardiology.  Cardiovascular risk factors: Prior clinical ASCVD: None Comorbid conditions: hypertension, hyperlipidemia, pre-diabetes  Metabolic syndrome/Obesity: BMI 36 Chronic inflammatory conditions: none Tobacco use history: Smoking less than 1 ppd, maybe 0.5 ppd, A few times he was able to quit for about a month. Alcohol: Has quit drinking Family history: Father died at 54 yo of second or third heart attack. Mother has diabetes, on insulin. Prior cardiac testing and/or incidental findings on other testing (ie coronary calcium): Exercise level: Mostly active through walking at work. Not much formal exercise. Current diet: Runs a mostly homegrown produce stand. Often eats the produce before it spoils.  Current Supplements: Omega 3, Vascepa, Vitamin E, and Vitamin D3  Occasionally checks his blood pressure at home when he believes it will be highest. Generally sees readings such as 130s/105-110.  Usually he feels good most of the time. He is very active at work and may feel winded at times, but nothing that overly concerns him.  He denies any palpitations, or chest pain. No lightheadedness, headaches, syncope, orthopnea, PND, or lower extremity edema. Endorses easily bleeding.  We reviewed his prior echo, noting  cardiomyopathy. He denies any symptoms or limitations.   Past Medical History:  Diagnosis Date   Hyperlipidemia    Hypertension    Pre-diabetes    A1c 6%    Past Surgical History:  Procedure Laterality Date   colonoscopy with polypectomy  2009   Dr Leafy [facility]   TONSILLECTOMY AND ADENOIDECTOMY     WISDOM TOOTH EXTRACTION     two extraction    Current Medications: Current Outpatient Medications on File Prior to Visit  Medication Sig   amLODipine (NORVASC) 10 MG tablet Take 1 tablet (10 mg total) by mouth daily.   Ascorbic Acid (VITAMIN C) 1000 MG tablet Take 1,000 mg by mouth daily.   aspirin 81 MG tablet Take 81 mg by mouth daily.   Cholecalciferol (VITAMIN D3) 1.25 MG (50000 UT) CAPS Take 1 capsule by mouth once a week   dapagliflozin propanediol (FARXIGA) 10 MG TABS tablet Take 1 tablet (10 mg total) by mouth daily.   doxazosin (CARDURA) 2 MG tablet Take 1 tablet (2 mg total) by mouth daily.   irbesartan (AVAPRO) 300 MG tablet Take 1 tablet (300 mg total) by mouth daily.   Multiple Vitamin (MULTIVITAMIN PO) Take by mouth.   nebivolol (BYSTOLIC) 10 MG tablet Take 1 tablet (10 mg total) by mouth daily.   omeprazole (PRILOSEC) 40 MG capsule Take 1 capsule (40 mg total) by mouth daily.   rosuvastatin (CRESTOR) 10 MG tablet Take 1 tablet (10 mg total) by mouth daily.   spironolactone (ALDACTONE) 25 MG tablet Take 1 tablet (25 mg total) by mouth daily.  VASCEPA 1 g capsule Take 2 capsules by mouth twice daily   No current facility-administered medications on file prior to visit.     Allergies:   Metformin and related   Social History   Tobacco Use   Smoking status: Every Day    Packs/day: 0.25    Years: 41.00    Pack years: 10.25    Types: Cigarettes    Last attempt to quit: 04/18/1991    Years since quitting: 29.8   Smokeless tobacco: Never   Tobacco comments:    smoked 1972-1993, up to 1/2 ppd. As of 02/24/15 5 cig/day  Vaping Use   Vaping Use: Some days   Substance Use Topics   Alcohol use: Not Currently    Comment:  1 mixed drink/week   Drug use: No    Family History: family history includes Diabetes in his mother; Heart attack in his paternal grandfather; Heart attack (age of onset: 13) in his father. There is no history of Cancer or Stroke.  ROS:   Please see the history of present illness.  Additional pertinent ROS: Constitutional: Negative for chills, fever, night sweats, unintentional weight loss  HENT: Negative for ear pain and hearing loss.   Eyes: Negative for loss of vision and eye pain.  Respiratory: Positive for exertional shortness of breath. Negative for cough, sputum, wheezing.   Cardiovascular: See HPI. Gastrointestinal: Negative for abdominal pain, melena, and hematochezia.  Genitourinary: Negative for dysuria and hematuria.  Musculoskeletal: Negative for falls and myalgias.  Skin: Negative for itching and rash.  Neurological: Negative for focal weakness, focal sensory changes and loss of consciousness.  Endo/Heme/Allergies: Positive for bleeds easily. Does not bruise easily.     EKGs/Labs/Other Studies Reviewed:    The following studies were reviewed today:  Echo 02/26/2019:  1. Left ventricular ejection fraction, by visual estimation, is 35 to  40%. The left ventricle has mildly decreased function. There is severely  increased left ventricular hypertrophy.   2. Left ventricular diastolic parameters are indeterminate.   3. Global right ventricle has normal systolic function.The right  ventricular size is normal. No increase in right ventricular wall  thickness.   4. Left atrial size was normal.   5. Right atrial size was normal.   6. Mild to moderate aortic valve annular calcification.   7. The mitral valve is normal in structure. No evidence of mitral valve  regurgitation. No evidence of mitral stenosis.   8. The tricuspid valve is normal in structure. Tricuspid valve  regurgitation is not demonstrated.    9. The aortic valve is tricuspid. Aortic valve regurgitation is not  visualized.  10. There is Mild thickening of the aortic valve.  11. There is Moderate calcification of the aortic valve.  12. The pulmonic valve was normal in structure. Pulmonic valve  regurgitation is not visualized.  13. The atrial septum is grossly normal.  14. The average left ventricular global longitudinal strain is -15.9 %.   Nuclear Stress Test 02/19/2019: Nuclear stress EF: 44%. Mild diffuse hypokinesis The left ventricular ejection fraction is mildly decreased (45-54%). There was no ST segment deviation noted during stress. Defect 1: There is a small defect of mild severity present in the apical inferior and apical lateral location. Fixed defect. This is an intermediate risk study based upon reduced EF. Consider echocardiogram for verification. There is no ischemia identified.  Carotid Duplex 05/16/2017: Final Interpretation:  Right Carotid: Velocities in the right ICA are consistent with a 1-39%  stenosis.  Left Carotid: Velocities in the left ICA are consistent with a 1-39%  stenosis.   Vertebrals:  Both vertebral arteries were patent with antegrade flow.  Subclavians: Normal flow hemodynamics were seen in bilateral subclavian               arteries.   EKG:  EKG is personally reviewed.   02/08/2021: Normal sinus rhythm at 74 bpm. Incomplete RBBB, borderline LVH  Recent Labs: 11/24/2020: ALT 36; BUN 23; Creatinine, Ser 1.83; Hemoglobin 16.0; Platelets 227.0; Potassium 4.8; Pro B Natriuretic peptide (BNP) 24.0; Sodium 139; TSH 2.21   Recent Lipid Panel    Component Value Date/Time   CHOL 153 11/24/2020 0924   CHOL 185 01/15/2014 0836   TRIG (H) 11/24/2020 0924    723.0 Triglyceride is over 400; calculations on Lipids are invalid.   TRIG 327 (H) 01/15/2014 0836   TRIG 307 (HH) 02/20/2006 0839   HDL 26.90 (L) 11/24/2020 0924   HDL 27 (L) 01/15/2014 0836   CHOLHDL 6 11/24/2020 0924   VLDL 78.8  (H) 05/10/2017 1601   LDLCALC 93 01/15/2014 0836   LDLDIRECT 51.0 11/24/2020 0924    Physical Exam:    VS:  BP 122/78   Pulse 74   Ht 5\' 9"  (1.753 m)   Wt 244 lb 3.2 oz (110.8 kg)   SpO2 93%   BMI 36.06 kg/m     Wt Readings from Last 3 Encounters:  02/08/21 244 lb 3.2 oz (110.8 kg)  11/24/20 242 lb (109.8 kg)  05/12/20 246 lb (111.6 kg)    GEN: Well nourished, well developed in no acute distress HEENT: Normal, moist mucous membranes NECK: No JVD CARDIAC: regular rhythm, normal S1 and S2, no rubs or gallops. No murmur. VASCULAR: Radial and DP pulses 2+ bilaterally. No carotid bruits RESPIRATORY:  Clear to auscultation without rales, wheezing or rhonchi  ABDOMEN: Soft, non-tender, non-distended MUSCULOSKELETAL:  Ambulates independently SKIN: Warm and dry, no edema NEUROLOGIC:  Alert and oriented x 3. No focal neuro deficits noted. PSYCHIATRIC:  Normal affect    ASSESSMENT:    1. Cardiomyopathy, unspecified type (HCC)   2. Tobacco abuse   3. Essential hypertension   4. Mixed hyperlipidemia   5. Diabetes mellitus type 2 in obese (HCC)   6. Cardiac risk counseling   7. Counseling on health promotion and disease prevention   8. Tobacco abuse counseling   9. Nutritional counseling   10. Exercise counseling   11. Stage 3b chronic kidney disease (HCC)    PLAN:    Abnormal ECG: no high risk findings. LVH consistent with prior echo  Cardiomyopathy, unclear etiology Severe LVH Chronic kidney disease, most recent stage 3b (GFR 38)  -NYHA class I -EF 35-40% in 02/2019 -had myoview at that time without ischemia, has never had cath -on nebivolol, would recommend metoprolol, carvedilol, or bisoprolol given cardiomyopathy -on spironolactone, irbesartan -on dapagliflozin -we discussed potential etiologies of this. Never had cath.  -we discussed entresto, but he has had issues with cost of medications in the past and would like to try to stay on generics going forward.  Discussed patient assistance is available if he qualifies. -from chart review, appears that LVH/cardiomyopathy thought to be due to hypertension. Need to consider alternatives as well. We discussed cath, MRI, repeat echo. After shared decision making, will evaluate at follow up in 3 months. Re-discuss med changes and next steps at that time  Hypertension: -on nebivolol, amlodipine, spironolactone, irbesartan, doxazosin -would aim to uptitrate GDMT for cardiomyopathy  and stop amlodipine if able  Mixed hyperlipidemia -discussed that niacin is no longer recommended -discussed he should not be on both OTC fish oil and vascepa -continue rosuvastatin -needs updated lipids. Not fasting today. With history of elevated TG, needs fasting labs  Type II diabetes Obesity -on aspirin, rosuvastatin -on dapagliflozin -last A1c 6.7  Tobacco abuse: The patient was counseled on tobacco cessation today for 4 minutes.  Counseling included reviewing the risks of smoking tobacco products, how it impacts the patient's current medical diagnoses and different strategies for quitting.  Pharmacotherapy to aid in tobacco cessation was not prescribed today.   Cardiac risk counseling and prevention recommendations: has family history of heart disease -recommend heart healthy/Mediterranean diet, with whole grains, fruits, vegetable, fish, lean meats, nuts, and olive oil. Limit salt. -recommend moderate walking, 3-5 times/week for 30-50 minutes each session. Aim for at least 150 minutes.week. Goal should be pace of 3 miles/hours, or walking 1.5 miles in 30 minutes -recommend avoidance of tobacco products. Avoid excess alcohol. -ASCVD risk score: The 10-year ASCVD risk score (Arnett DK, et al., 2019) is: 38%   Values used to calculate the score:     Age: 89 years     Sex: Male     Is Non-Hispanic African American: No     Diabetic: Yes     Tobacco smoker: Yes     Systolic Blood Pressure: 122 mmHg     Is BP treated:  Yes     HDL Cholesterol: 26.9 mg/dL     Total Cholesterol: 153 mg/dL    Plan for follow up: 3 months or sooner as needed.  Jodelle Red, MD, PhD, [facility]  [facility]    Medication Adjustments/Labs and Tests Ordered: Current medicines are reviewed at length with the patient today.  Concerns regarding medicines are outlined above.   Orders Placed This Encounter  Procedures   Lipid panel   EKG 12-Lead     No orders of the defined types were placed in this encounter.   Patient Instructions  Medication Instructions:  Changes today:  Stop: Niacin, Omega 3 and Vitamin E   *If you need a refill on your cardiac medications before your next appointment, please call your pharmacy*   Lab Work: Please return at your convince for a Fasting Lipid Panel at the [facility] 3rd floor lab  If you have labs (blood work) drawn today and your tests are completely normal, you will receive your results only by: MyChart Message (if you have MyChart) OR A paper copy in the mail If you have any lab test that is abnormal or we need to change your treatment, we will call you to review the results.    For blood pressure and reduced heart function, we will keep dapagliflozin, doxazosin, irbesartan, nebivolol, and spironolactone. We discussed Entresto but it sounds like cost will be an issue. Testing/Procedures: None today   Follow-Up: At [facility], you and your health needs are our priority.  As part of our continuing mission to provide you with exceptional heart care, we have created designated Provider Care Teams.  These Care Teams include your primary Cardiologist (physician) and Advanced Practice Providers (APPs -  Physician Assistants and Nurse Practitioners) who all work together to provide you with the care you need, when you need it.  We recommend signing up for the patient portal called "MyChart".  Sign up information is provided on this After Visit  Summary.  MyChart is used to connect with patients  for Virtual Visits (Telemedicine).  Patients are able to view lab/test results, encounter notes, upcoming appointments, etc.  Non-urgent messages can be sent to your provider as well.   To learn more about what you can do with MyChart, go to [email or web address].    Your next appointment:   3 month(s)  The format for your next appointment:   In Person  Provider:   Jodelle Red, MD   Other Instructions We are adjusting some of your medications today: Your last cholesterol showed that your bad cholesterol (LDL) was well controlled at 51. Your triglycerides however were very high. I would keep the rosuvastatin and vascepa for now. Stop niacin as this is not recommended anymore. We will need to recheck your fasting cholesterol to see where your fasting triglycerides are. If there are still elevated, we will need to discuss adding more medications. You can stop the Omega 3 because the vascepa is a super fish oil.  I would also recommend stopping vitamin E.  For blood pressure and reduced heart function, we will keep dapagliflozin, doxazosin, irbesartan, nebivolol, and spironolactone. We discussed Entresto but it sounds like cost will be an issue.    I,Mathew Stumpf,acting as a Neurosurgeon for [employer], MD.,have documented all relevant documentation on the behalf of Jodelle Red, MD,as directed by  Jodelle Red, MD while in the presence of Jodelle Red, MD.   I, Jodelle Red, MD, have reviewed all documentation for this visit. The documentation on 02/09/21 for the exam, diagnosis, procedures, and orders are all accurate and complete.   Signed, Jodelle Red, MD PhD 02/09/2021 3:12 PM    [facility]

## 2021-02-08 NOTE — Patient Instructions (Signed)
Medication Instructions:  Changes today:  Stop: Niacin, Omega 3 and Vitamin E   *If you need a refill on your cardiac medications before your next appointment, please call your pharmacy*   Lab Work: Please return at your convince for a Fasting Lipid Panel at the [facility] 3rd floor lab  If you have labs (blood work) drawn today and your tests are completely normal, you will receive your results only by: MyChart Message (if you have MyChart) OR A paper copy in the mail If you have any lab test that is abnormal or we need to change your treatment, we will call you to review the results.    For blood pressure and reduced heart function, we will keep dapagliflozin, doxazosin, irbesartan, nebivolol, and spironolactone. We discussed Entresto but it sounds like cost will be an issue. Testing/Procedures: None today   Follow-Up: At [facility], you and your health needs are our priority.  As part of our continuing mission to provide you with exceptional heart care, we have created designated Provider Care Teams.  These Care Teams include your primary Cardiologist (physician) and Advanced Practice Providers (APPs -  Physician Assistants and Nurse Practitioners) who all work together to provide you with the care you need, when you need it.  We recommend signing up for the patient portal called "MyChart".  Sign up information is provided on this After Visit Summary.  MyChart is used to connect with patients for Virtual Visits (Telemedicine).  Patients are able to view lab/test results, encounter notes, upcoming appointments, etc.  Non-urgent messages can be sent to your provider as well.   To learn more about what you can do with MyChart, go to [email or web address].    Your next appointment:   3 month(s)  The format for your next appointment:   In Person  Provider:   Jodelle Red, MD   Other Instructions We are adjusting some of your medications today: Your last  cholesterol showed that your bad cholesterol (LDL) was well controlled at 51. Your triglycerides however were very high. I would keep the rosuvastatin and vascepa for now. Stop niacin as this is not recommended anymore. We will need to recheck your fasting cholesterol to see where your fasting triglycerides are. If there are still elevated, we will need to discuss adding more medications. You can stop the Omega 3 because the vascepa is a super fish oil.  I would also recommend stopping vitamin E.  For blood pressure and reduced heart function, we will keep dapagliflozin, doxazosin, irbesartan, nebivolol, and spironolactone. We discussed Entresto but it sounds like cost will be an issue.

## 2021-02-09 ENCOUNTER — Encounter (HOSPITAL_BASED_OUTPATIENT_CLINIC_OR_DEPARTMENT_OTHER): Payer: Self-pay | Admitting: Cardiology

## 2021-02-09 DIAGNOSIS — N1832 Chronic kidney disease, stage 3b: Secondary | ICD-10-CM | POA: Insufficient documentation

## 2021-03-04 ENCOUNTER — Other Ambulatory Visit: Payer: Self-pay | Admitting: Internal Medicine

## 2021-03-04 DIAGNOSIS — E1121 Type 2 diabetes mellitus with diabetic nephropathy: Secondary | ICD-10-CM

## 2021-03-04 DIAGNOSIS — E781 Pure hyperglyceridemia: Secondary | ICD-10-CM

## 2021-03-07 ENCOUNTER — Telehealth: Payer: Self-pay

## 2021-03-07 DIAGNOSIS — Z006 Encounter for examination for normal comparison and control in clinical research program: Secondary | ICD-10-CM

## 2021-03-07 NOTE — Telephone Encounter (Signed)
I called to speak with pt about participating in our CORE trail. Pt did not answer. I was unable to leave a voicemail but I sent an e-mail.

## 2021-04-12 ENCOUNTER — Telehealth: Payer: Self-pay | Admitting: *Deleted

## 2021-04-12 NOTE — Telephone Encounter (Signed)
Spoke with pt about Core research study. I have emailed the consent for him to review. Encouraged him to call me with amy questions, and that I will follow up with him at a later date to see if he would be interested in this study.  Voices understanding.

## 2021-04-14 ENCOUNTER — Telehealth: Payer: Self-pay | Admitting: *Deleted

## 2021-04-14 NOTE — Telephone Encounter (Signed)
Spoke with Mr Sage about core research. He states he wants to talk to his Dr before signing up to do the study. He states he will see his Dr in January so he will discuss it with the Dr then. Encouraged him to cal or e-mail with any questions.

## 2021-05-11 ENCOUNTER — Ambulatory Visit (INDEPENDENT_AMBULATORY_CARE_PROVIDER_SITE_OTHER): Payer: No Typology Code available for payment source | Admitting: Cardiology

## 2021-05-11 ENCOUNTER — Other Ambulatory Visit: Payer: Self-pay

## 2021-05-11 ENCOUNTER — Encounter (HOSPITAL_BASED_OUTPATIENT_CLINIC_OR_DEPARTMENT_OTHER): Payer: Self-pay | Admitting: Cardiology

## 2021-05-11 VITALS — BP 130/84 | HR 75 | Ht 69.0 in | Wt 243.5 lb

## 2021-05-11 DIAGNOSIS — E1169 Type 2 diabetes mellitus with other specified complication: Secondary | ICD-10-CM

## 2021-05-11 DIAGNOSIS — I429 Cardiomyopathy, unspecified: Secondary | ICD-10-CM | POA: Diagnosis not present

## 2021-05-11 DIAGNOSIS — I517 Cardiomegaly: Secondary | ICD-10-CM | POA: Diagnosis not present

## 2021-05-11 DIAGNOSIS — I10 Essential (primary) hypertension: Secondary | ICD-10-CM

## 2021-05-11 DIAGNOSIS — N1832 Chronic kidney disease, stage 3b: Secondary | ICD-10-CM | POA: Diagnosis not present

## 2021-05-11 DIAGNOSIS — E669 Obesity, unspecified: Secondary | ICD-10-CM

## 2021-05-11 DIAGNOSIS — E782 Mixed hyperlipidemia: Secondary | ICD-10-CM

## 2021-05-11 DIAGNOSIS — Z720 Tobacco use: Secondary | ICD-10-CM

## 2021-05-11 LAB — LIPID PANEL
Chol/HDL Ratio: 6.8 ratio — ABNORMAL HIGH (ref 0.0–5.0)
Cholesterol, Total: 162 mg/dL (ref 100–199)
HDL: 24 mg/dL — ABNORMAL LOW (ref >39)
LDL Chol Calc (NIH): 53 mg/dL (ref 0–99)
Triglycerides: 571 mg/dL (ref 0–149)
VLDL Cholesterol Cal: 85 mg/dL — ABNORMAL HIGH (ref 5–40)

## 2021-05-11 MED ORDER — CARVEDILOL 25 MG PO TABS
25.0000 mg | ORAL_TABLET | Freq: Two times a day (BID) | ORAL | 3 refills | Status: DC
Start: 2021-05-11 — End: 2021-06-01

## 2021-05-11 NOTE — Progress Notes (Signed)
Cardiology Office Note:    Date:  05/11/2021   ID:  Tim Decker, DOB 17-May-1955, MRN [id number]  PCP:  Janith Lima, MD  Cardiologist:  Buford Dresser, MD  Referring MD: Janith Lima, MD   CC: Follow-up  History of Present Illness:    Tim Decker is a 66 y.o. male with a hx of cardiomyopathy, hyperlipidemia, hypertension, and pre-diabetes, who is seen for follow-up. I initially met him 02/08/2021 as a new consult at the request of Janith Lima, MD for the evaluation and management of abnormal electrocardiogram results.   CV history: echo 2020 with EF 35-40%. Severe LVH. Myoview 2020 EF 44%, mild diffuse hypokinesis. Small fixed mild defect in apical area, no ischemia.  Today: Overall, he is feeling good. His blood pressure seems to be well controlled at home, usually very similar to the reading in clinic today 130/84. Sometimes it will be a little higher, but his blood pressure has never been too low.  Every once in a while he may feel mild palpitations. They do not cause him to stop and they do not concern him.  He denies any chest pain, or shortness of breath. No lightheadedness, headaches, syncope, orthopnea, PND, lower extremity edema or exertional symptoms. He is fasting this morning.  Soon he plans to travel to the [facility] for a month-long trip.  Past Medical History:  Diagnosis Date   Hyperlipidemia    Hypertension    Pre-diabetes    A1c 6%    Past Surgical History:  Procedure Laterality Date   colonoscopy with polypectomy  2009   [facility]   TONSILLECTOMY AND ADENOIDECTOMY     WISDOM TOOTH EXTRACTION     two extraction    Current Medications: Current Outpatient Medications on File Prior to Visit  Medication Sig   amLODipine (NORVASC) 10 MG tablet Take 1 tablet (10 mg total) by mouth daily.   Ascorbic Acid (VITAMIN C) 1000 MG tablet Take 1,000 mg by mouth daily.   aspirin 81 MG tablet Take 81 mg by mouth daily.    Cholecalciferol (VITAMIN D3) 1.25 MG (50000 UT) CAPS Take 1 capsule by mouth once a week   dapagliflozin propanediol (FARXIGA) 10 MG TABS tablet Take 1 tablet (10 mg total) by mouth daily.   doxazosin (CARDURA) 2 MG tablet Take 1 tablet (2 mg total) by mouth daily.   irbesartan (AVAPRO) 300 MG tablet Take 1 tablet (300 mg total) by mouth daily.   Multiple Vitamin (MULTIVITAMIN PO) Take by mouth.   omeprazole (PRILOSEC) 40 MG capsule Take 1 capsule (40 mg total) by mouth daily.   rosuvastatin (CRESTOR) 10 MG tablet Take 1 tablet (10 mg total) by mouth daily.   spironolactone (ALDACTONE) 25 MG tablet Take 1 tablet (25 mg total) by mouth daily.   VASCEPA 1 g capsule Take 2 capsules by mouth twice daily   No current facility-administered medications on file prior to visit.     Allergies:   Metformin and related   Social History   Tobacco Use   Smoking status: Every Day    Packs/day: 0.25    Years: 41.00    Pack years: 10.25    Types: Cigarettes    Last attempt to quit: 04/18/1991    Years since quitting: 30.0   Smokeless tobacco: Never   Tobacco comments:    smoked 1972-1993, up to 1/2 ppd. As of 02/24/15 5 cig/day  Vaping Use   Vaping Use: Some days  Substance Use Topics   Alcohol use: Not Currently    Comment:  1 mixed drink/week   Drug use: No    Family History: family history includes Diabetes in his mother; Heart attack in his paternal grandfather; Heart attack (age of onset: 72) in his father. There is no history of Cancer or Stroke. Father died at 74 yo of second or third heart attack. Mother has diabetes, on insulin.  ROS:   Please see the history of present illness. (+) Palpitations All other systems are reviewed and negative.    EKGs/Labs/Other Studies Reviewed:    The following studies were reviewed today:  Echo 02/26/2019:  1. Left ventricular ejection fraction, by visual estimation, is 35 to  40%. The left ventricle has mildly decreased function. There is  severely  increased left ventricular hypertrophy.   2. Left ventricular diastolic parameters are indeterminate.   3. Global right ventricle has normal systolic function.The right  ventricular size is normal. No increase in right ventricular wall  thickness.   4. Left atrial size was normal.   5. Right atrial size was normal.   6. Mild to moderate aortic valve annular calcification.   7. The mitral valve is normal in structure. No evidence of mitral valve  regurgitation. No evidence of mitral stenosis.   8. The tricuspid valve is normal in structure. Tricuspid valve  regurgitation is not demonstrated.   9. The aortic valve is tricuspid. Aortic valve regurgitation is not  visualized.  10. There is Mild thickening of the aortic valve.  11. There is Moderate calcification of the aortic valve.  12. The pulmonic valve was normal in structure. Pulmonic valve  regurgitation is not visualized.  13. The atrial septum is grossly normal.  14. The average left ventricular global longitudinal strain is -15.9 %.   Nuclear Stress Test 02/19/2019: Nuclear stress EF: 44%. Mild diffuse hypokinesis The left ventricular ejection fraction is mildly decreased (45-54%). There was no ST segment deviation noted during stress. Defect 1: There is a small defect of mild severity present in the apical inferior and apical lateral location. Fixed defect. This is an intermediate risk study based upon reduced EF. Consider echocardiogram for verification. There is no ischemia identified.  Carotid Duplex 05/16/2017: Final Interpretation:  Right Carotid: Velocities in the right ICA are consistent with a 1-39%  stenosis.   Left Carotid: Velocities in the left ICA are consistent with a 1-39%  stenosis.   Vertebrals:  Both vertebral arteries were patent with antegrade flow.  Subclavians: Normal flow hemodynamics were seen in bilateral subclavian arteries.   EKG:  EKG is personally reviewed.   05/11/2021: not ordered  today 02/08/2021: Normal sinus rhythm at 74 bpm. Incomplete RBBB, borderline LVH  Recent Labs: 11/24/2020: ALT 36; BUN 23; Creatinine, Ser 1.83; Hemoglobin 16.0; Platelets 227.0; Potassium 4.8; Pro B Natriuretic peptide (BNP) 24.0; Sodium 139; TSH 2.21   Recent Lipid Panel    Component Value Date/Time   CHOL 153 11/24/2020 0924   CHOL 185 01/15/2014 0836   TRIG (H) 11/24/2020 0924    723.0 Triglyceride is over 400; calculations on Lipids are invalid.   TRIG 327 (H) 01/15/2014 0836   TRIG 307 (HH) 02/20/2006 0839   HDL 26.90 (L) 11/24/2020 0924   HDL 27 (L) 01/15/2014 0836   CHOLHDL 6 11/24/2020 0924   VLDL 78.8 (H) 05/10/2017 1601   LDLCALC 93 01/15/2014 0836   LDLDIRECT 51.0 11/24/2020 0924    Physical Exam:    VS:  BP  130/84 (BP Location: Right Arm, Patient Position: Sitting, Cuff Size: Large)    Pulse 75    Ht _0  (1.753 m)    Wt 243 lb 8 oz (110.5 kg)    SpO2 96%    BMI 35.96 kg/m     Wt Readings from Last 3 Encounters:  05/11/21 243 lb 8 oz (110.5 kg)  02/08/21 244 lb 3.2 oz (110.8 kg)  11/24/20 242 lb (109.8 kg)    GEN: Well nourished, well developed in no acute distress HEENT: Normal, moist mucous membranes NECK: No JVD CARDIAC: regular rhythm, normal S1 and S2, no rubs or gallops. No murmur. VASCULAR: Radial and DP pulses 2+ bilaterally. No carotid bruits RESPIRATORY:  Clear to auscultation without rales, wheezing or rhonchi  ABDOMEN: Soft, non-tender, non-distended MUSCULOSKELETAL:  Ambulates independently SKIN: Warm and dry, no edema NEUROLOGIC:  Alert and oriented x 3. No focal neuro deficits noted. PSYCHIATRIC:  Normal affect    ASSESSMENT:    1. Cardiomyopathy, unspecified type ([place])   2. Essential hypertension   3. LVH (left ventricular hypertrophy)   4. Stage 3b chronic kidney disease ([place])   5. Tobacco abuse   6. Mixed hyperlipidemia   7. Diabetes mellitus type 2 in obese [facility])     PLAN:    Cardiomyopathy, unclear etiology (suspect  nonischemic due to stress test) Severe LVH Chronic kidney disease, most recent stage 3b (GFR 38)  -NYHA class I -EF 35-40% in 02/2019 -had myoview at that time without ischemia, has never had cath -on nebivolol, would recommend metoprolol, carvedilol, or bisoprolol given cardiomyopathy. Changing to carvedilol today -on spironolactone, irbesartan -on dapagliflozin -we discussed potential etiologies of this. Never had cath.  -we discussed entresto, declined due to cost -discussed re-evaluation. After shared decision making, will repeat echo given his medical therapy. If EF still reduced, may need to consider cath for further evaluation  Hypertension: -on nebivolol (changing to carvedilol), amlodipine, spironolactone, irbesartan, doxazosin  Mixed hyperlipidemia -continue rosuvastatin, vascepa -needs updated lipids, ordered. Fasting today.   Type II diabetes Obesity -on aspirin, rosuvastatin -on dapagliflozin -last A1c 6.7  Tobacco abuse: not ready to quit  Cardiac risk counseling and prevention recommendations: has family history of heart disease -recommend heart healthy/Mediterranean diet, with whole grains, fruits, vegetable, fish, lean meats, nuts, and olive oil. Limit salt. -recommend moderate walking, 3-5 times/week for 30-50 minutes each session. Aim for at least 150 minutes.week. Goal should be pace of 3 miles/hours, or walking 1.5 miles in 30 minutes -recommend avoidance of tobacco products. Avoid excess alcohol. -ASCVD risk score: The 10-year ASCVD risk score (Arnett DK, et al., 2019) is: 42.9%   Values used to calculate the score:     Age: 41 years     Sex: Male     Is Non-Hispanic African American: No     Diabetic: Yes     Tobacco smoker: Yes     Systolic Blood Pressure: 891 mmHg     Is BP treated: Yes     HDL Cholesterol: 26.9 mg/dL     Total Cholesterol: 153 mg/dL    Plan for follow up: 4 months or sooner as needed.  Buford Dresser, MD, PhD, [facility]    Medication Adjustments/Labs and Tests Ordered: Current medicines are reviewed at length with the patient today.  Concerns regarding medicines are outlined above.   Orders Placed This Encounter  Procedures   ECHOCARDIOGRAM COMPLETE   Meds ordered this encounter  Medications  carvedilol (COREG) 25 MG tablet    Sig: Take 1 tablet (25 mg total) by mouth 2 (two) times daily.    Dispense:  180 tablet    Refill:  3   Patient Instructions  Medication Instructions:  1) STOP: Bystolic 10 mg 2) START: Carvedilol 25 mg twice a day  *If you need a refill on your cardiac medications before your next appointment, please call your pharmacy*   Lab Work: Your provider has recommended lab work today (Fasting lipid). Please have this collected at [facility]. The lab is open 8:00 am - 4:30 pm. Please avoid 12:00p - 1:00p for lunch hour. You do not need an appointment. Please go to [address] [place], [place] [address]. This is in the Primary Care office on the 3rd floor, let them know you are there for blood work and they will direct you to the lab.  If you have labs (blood work) drawn today and your tests are completely normal, you will receive your results only by: [place] (if you have MyChart) OR A paper copy in the mail If you have any lab test that is abnormal or we need to change your treatment, we will call you to review the results.   Testing/Procedures: Your physician has requested that you have an echocardiogram. Echocardiography is a painless test that uses sound waves to create images of your heart. It provides your doctor with information about the size and shape of your heart and how well your hearts chambers and valves are working. This procedure takes approximately one hour. There are no restrictions for this procedure. [place], you and your  health needs are our priority.  As part of our continuing mission to provide you with exceptional heart care, we have created designated Provider Care Teams.  These Care Teams include your primary Cardiologist (physician) and Advanced Practice Providers (APPs -  Physician Assistants and Nurse Practitioners) who all work together to provide you with the care you need, when you need it.  We recommend signing up for the patient portal called "MyChart".  Sign up information is provided on this After Visit Summary.  MyChart is used to connect with patients for Virtual Visits (Telemedicine).  Patients are able to view lab/test results, encounter notes, upcoming appointments, etc.  Non-urgent messages can be sent to your provider as well.   To learn more about what you can do with MyChart, go to [email or web address].    Your next appointment:   4 month(s)  The format for your next appointment:   In Person  Provider:   Buford Dresser, MD       [facility] Stumpf,acting as a scribe for Buford Dresser, MD.,have documented all relevant documentation on the behalf of Buford Dresser, MD,as directed by  Buford Dresser, MD while in the presence of Buford Dresser, MD.  I, Buford Dresser, MD, have reviewed all documentation for this visit. The documentation on 05/11/21 for the exam, diagnosis, procedures, and orders are all accurate and complete.   Signed, Buford Dresser, MD PhD 05/11/2021 1:12 PM    [facility]

## 2021-05-11 NOTE — Patient Instructions (Signed)
Medication Instructions:  1) STOP: Bystolic 10 mg 2) START: Carvedilol 25 mg twice a day  *If you need a refill on your cardiac medications before your next appointment, please call your pharmacy*   Lab Work: Your provider has recommended lab work today (Fasting lipid). Please have this collected at [facility]. The lab is open 8:00 am - 4:30 pm. Please avoid 12:00p - 1:00p for lunch hour. You do not need an appointment. Please go to [address] [place], [place] [address]. This is in the Primary Care office on the 3rd floor, let them know you are there for blood work and they will direct you to the lab.  If you have labs (blood work) drawn today and your tests are completely normal, you will receive your results only by: MyChart Message (if you have MyChart) OR A paper copy in the mail If you have any lab test that is abnormal or we need to change your treatment, we will call you to review the results.   Testing/Procedures: Your physician has requested that you have an echocardiogram. Echocardiography is a painless test that uses sound waves to create images of your heart. It provides your doctor with information about the size and shape of your heart and how well your hearts chambers and valves are working. This procedure takes approximately one hour. There are no restrictions for this procedure. [address]    Follow-Up: At [facility], you and your health needs are our priority.  As part of our continuing mission to provide you with exceptional heart care, we have created designated Provider Care Teams.  These Care Teams include your primary Cardiologist (physician) and Advanced Practice Providers (APPs -  Physician Assistants and Nurse Practitioners) who all work together to provide you with the care you need, when you need it.  We recommend signing up for the patient portal called "MyChart".  Sign up information is  provided on this After Visit Summary.  MyChart is used to connect with patients for Virtual Visits (Telemedicine).  Patients are able to view lab/test results, encounter notes, upcoming appointments, etc.  Non-urgent messages can be sent to your provider as well.   To learn more about what you can do with MyChart, go to [email or web address].    Your next appointment:   4 month(s)  The format for your next appointment:   In Person  Provider:   Jodelle Red, MD

## 2021-05-12 ENCOUNTER — Encounter: Payer: Self-pay | Admitting: *Deleted

## 2021-05-12 DIAGNOSIS — Z006 Encounter for examination for normal comparison and control in clinical research program: Secondary | ICD-10-CM

## 2021-05-12 NOTE — Research (Signed)
Spoke with Mr Schulenburg about core research study. He states that he is not interested.

## 2021-05-20 ENCOUNTER — Other Ambulatory Visit: Payer: Self-pay

## 2021-05-20 ENCOUNTER — Ambulatory Visit (INDEPENDENT_AMBULATORY_CARE_PROVIDER_SITE_OTHER): Payer: No Typology Code available for payment source

## 2021-05-20 DIAGNOSIS — I428 Other cardiomyopathies: Secondary | ICD-10-CM

## 2021-05-20 DIAGNOSIS — I429 Cardiomyopathy, unspecified: Secondary | ICD-10-CM

## 2021-05-20 LAB — ECHOCARDIOGRAM COMPLETE
AR max vel: 2.88 cm2
AV Area VTI: 3.13 cm2
AV Area mean vel: 2.98 cm2
AV Mean grad: 2.7 mmHg
AV Peak grad: 6.2 mmHg
Ao pk vel: 1.24 m/s
Area-P 1/2: 3.5 cm2
Calc EF: 65.6 %
S' Lateral: 3.04 cm
Single Plane A2C EF: 57.1 %
Single Plane A4C EF: 72.2 %

## 2021-05-26 ENCOUNTER — Telehealth: Payer: Self-pay | Admitting: Internal Medicine

## 2021-05-26 ENCOUNTER — Other Ambulatory Visit: Payer: Self-pay | Admitting: Internal Medicine

## 2021-05-26 NOTE — Telephone Encounter (Signed)
Pt requesting all of maintenance medications to be sent to ASAP:   [facility] [address] [place] [facility] [address] Phone:  [phone]  Fax:  [phone]

## 2021-05-26 NOTE — Telephone Encounter (Signed)
Called pt, LVM stating that an OV would be needed for refills

## 2021-06-01 ENCOUNTER — Telehealth: Payer: Self-pay

## 2021-06-01 DIAGNOSIS — E781 Pure hyperglyceridemia: Secondary | ICD-10-CM

## 2021-06-01 DIAGNOSIS — K219 Gastro-esophageal reflux disease without esophagitis: Secondary | ICD-10-CM

## 2021-06-01 DIAGNOSIS — I429 Cardiomyopathy, unspecified: Secondary | ICD-10-CM

## 2021-06-01 DIAGNOSIS — R809 Proteinuria, unspecified: Secondary | ICD-10-CM

## 2021-06-01 DIAGNOSIS — E1121 Type 2 diabetes mellitus with diabetic nephropathy: Secondary | ICD-10-CM

## 2021-06-01 DIAGNOSIS — I10 Essential (primary) hypertension: Secondary | ICD-10-CM

## 2021-06-01 DIAGNOSIS — E876 Hypokalemia: Secondary | ICD-10-CM

## 2021-06-01 DIAGNOSIS — I119 Hypertensive heart disease without heart failure: Secondary | ICD-10-CM

## 2021-06-01 MED ORDER — AMLODIPINE BESYLATE 10 MG PO TABS: 10.0000 mg | ORAL_TABLET | Freq: Every day | ORAL | 0 refills | Status: DC

## 2021-06-01 MED ORDER — CARVEDILOL 25 MG PO TABS: 25.0000 mg | ORAL_TABLET | Freq: Two times a day (BID) | ORAL | 0 refills | Status: DC

## 2021-06-01 MED ORDER — IRBESARTAN 300 MG PO TABS
300.0000 mg | ORAL_TABLET | Freq: Every day | ORAL | 0 refills | Status: DC
Start: 2021-06-01 — End: 2021-09-03

## 2021-06-01 MED ORDER — SPIRONOLACTONE 25 MG PO TABS: 25.0000 mg | ORAL_TABLET | Freq: Every day | ORAL | 0 refills | Status: DC

## 2021-06-01 MED ORDER — VASCEPA 1 G PO CAPS: 2.0000 g | ORAL_CAPSULE | Freq: Two times a day (BID) | ORAL | 0 refills | Status: DC

## 2021-06-01 MED ORDER — OMEPRAZOLE 40 MG PO CPDR: 40.0000 mg | DELAYED_RELEASE_CAPSULE | Freq: Every day | ORAL | 0 refills | Status: DC

## 2021-06-01 NOTE — Telephone Encounter (Signed)
Pt is calling requesting a refill on: VASCEPA 1 g capsule amLODipine (NORVASC) 10 MG tablet dapagliflozin propanediol (FARXIGA) 10 MG TABS tablet rosuvastatin (CRESTOR) 10 MG tablet omeprazole (PRILOSEC) 40 MG capsule spironolactone (ALDACTONE) 25 MG tablet irbesartan (AVAPRO) 300 MG tablet doxazosin (CARDURA) 2 MG tablet Cholecalciferol (VITAMIN D3) 1.25 MG (50000 UT) CAPS  Pharmacy: [facility] [address], [place] [address]  LOV 11/24/20  Pt scheduled F/U for Dr. Yetta Barre first available 07/11/21  Pt CB [phone]

## 2021-06-10 ENCOUNTER — Other Ambulatory Visit: Payer: Self-pay | Admitting: Internal Medicine

## 2021-06-10 ENCOUNTER — Telehealth: Payer: Self-pay | Admitting: Internal Medicine

## 2021-06-10 DIAGNOSIS — N400 Enlarged prostate without lower urinary tract symptoms: Secondary | ICD-10-CM

## 2021-06-10 DIAGNOSIS — I10 Essential (primary) hypertension: Secondary | ICD-10-CM

## 2021-06-10 DIAGNOSIS — I119 Hypertensive heart disease without heart failure: Secondary | ICD-10-CM

## 2021-06-10 DIAGNOSIS — E785 Hyperlipidemia, unspecified: Secondary | ICD-10-CM

## 2021-06-10 MED ORDER — ROSUVASTATIN CALCIUM 10 MG PO TABS: 10.0000 mg | ORAL_TABLET | Freq: Every day | ORAL | 0 refills | Status: DC

## 2021-06-10 MED ORDER — DOXAZOSIN MESYLATE 2 MG PO TABS: 2.0000 mg | ORAL_TABLET | Freq: Every day | ORAL | 0 refills | Status: DC

## 2021-06-10 NOTE — Telephone Encounter (Signed)
1.Medication Requested: rosuvastatin (CRESTOR) 10 MG tablet doxazosin (CARDURA) 2 MG tablet   2. Pharmacy (Name, Street, [place]): [facility] [address], [place] [address]  Phone:  [phone] Fax:  [phone]    3. On Med List: yes  4. Last Visit with PCP: 08.10.22  5. Next visit date with PCP: 03.27.23   Agent: Please be advised that RX refills may take up to 3 business days. We ask that you follow-up with your pharmacy.

## 2021-07-11 ENCOUNTER — Other Ambulatory Visit: Payer: Self-pay

## 2021-07-11 ENCOUNTER — Ambulatory Visit (INDEPENDENT_AMBULATORY_CARE_PROVIDER_SITE_OTHER): Payer: No Typology Code available for payment source | Admitting: Internal Medicine

## 2021-07-11 ENCOUNTER — Encounter: Payer: Self-pay | Admitting: Internal Medicine

## 2021-07-11 VITALS — BP 128/72 | HR 62 | Temp 98.1°F | Resp 16 | Ht 69.0 in | Wt 223.0 lb

## 2021-07-11 DIAGNOSIS — E1121 Type 2 diabetes mellitus with diabetic nephropathy: Secondary | ICD-10-CM

## 2021-07-11 DIAGNOSIS — E1169 Type 2 diabetes mellitus with other specified complication: Secondary | ICD-10-CM

## 2021-07-11 DIAGNOSIS — E669 Obesity, unspecified: Secondary | ICD-10-CM | POA: Diagnosis not present

## 2021-07-11 DIAGNOSIS — N1831 Chronic kidney disease, stage 3a: Secondary | ICD-10-CM | POA: Diagnosis not present

## 2021-07-11 DIAGNOSIS — E785 Hyperlipidemia, unspecified: Secondary | ICD-10-CM

## 2021-07-11 DIAGNOSIS — E781 Pure hyperglyceridemia: Secondary | ICD-10-CM

## 2021-07-11 DIAGNOSIS — Z23 Encounter for immunization: Secondary | ICD-10-CM

## 2021-07-11 DIAGNOSIS — R195 Other fecal abnormalities: Secondary | ICD-10-CM

## 2021-07-11 LAB — CBC WITH DIFFERENTIAL/PLATELET
Basophils Absolute: 0 10*3/uL (ref 0.0–0.1)
Basophils Relative: 0.6 % (ref 0.0–3.0)
Eosinophils Absolute: 0.2 10*3/uL (ref 0.0–0.7)
Eosinophils Relative: 3.8 % (ref 0.0–5.0)
HCT: 47.2 % (ref 39.0–52.0)
Hemoglobin: 15.8 g/dL (ref 13.0–17.0)
Lymphocytes Relative: 22.7 % (ref 12.0–46.0)
Lymphs Abs: 1.5 10*3/uL (ref 0.7–4.0)
MCHC: 33.6 g/dL (ref 30.0–36.0)
MCV: 89.9 fl (ref 78.0–100.0)
Monocytes Absolute: 0.7 10*3/uL (ref 0.1–1.0)
Monocytes Relative: 10.1 % (ref 3.0–12.0)
Neutro Abs: 4.1 10*3/uL (ref 1.4–7.7)
Neutrophils Relative %: 62.8 % (ref 43.0–77.0)
Platelets: 246 10*3/uL (ref 150.0–400.0)
RBC: 5.25 Mil/uL (ref 4.22–5.81)
RDW: 14.9 % (ref 11.5–15.5)
WBC: 6.5 10*3/uL (ref 4.0–10.5)

## 2021-07-11 LAB — BASIC METABOLIC PANEL
BUN: 32 mg/dL — ABNORMAL HIGH (ref 6–23)
CO2: 25 mEq/L (ref 19–32)
Calcium: 9.8 mg/dL (ref 8.4–10.5)
Chloride: 102 mEq/L (ref 96–112)
Creatinine, Ser: 1.73 mg/dL — ABNORMAL HIGH (ref 0.40–1.50)
GFR: 40.68 mL/min — ABNORMAL LOW (ref >60.00)
Glucose, Bld: 101 mg/dL — ABNORMAL HIGH (ref 70–99)
Potassium: 4.7 mEq/L (ref 3.5–5.1)
Sodium: 135 mEq/L (ref 135–145)

## 2021-07-11 LAB — LIPID PANEL
Cholesterol: 119 mg/dL (ref 0–200)
HDL: 25.3 mg/dL — ABNORMAL LOW (ref >39.00)
NonHDL: 93.21
Total CHOL/HDL Ratio: 5
Triglycerides: 259 mg/dL — ABNORMAL HIGH (ref 0.0–149.0)
VLDL: 51.8 mg/dL — ABNORMAL HIGH (ref 0.0–40.0)

## 2021-07-11 LAB — LDL CHOLESTEROL, DIRECT: Direct LDL: 59 mg/dL

## 2021-07-11 LAB — HEMOGLOBIN A1C: Hgb A1c MFr Bld: 6.4 % (ref 4.6–6.5)

## 2021-07-11 MED ORDER — ROSUVASTATIN CALCIUM 10 MG PO TABS: 10.0000 mg | ORAL_TABLET | Freq: Every day | ORAL | 1 refills | Status: DC

## 2021-07-11 MED ORDER — BOOSTRIX 5-2.5-18.5 LF-MCG/0.5 IM SUSP: 0.5000 mL | Freq: Once | INTRAMUSCULAR | 0 refills | Status: AC

## 2021-07-11 MED ORDER — OMEGA-3-ACID ETHYL ESTERS 1 G PO CAPS: 2.0000 g | ORAL_CAPSULE | Freq: Two times a day (BID) | ORAL | 1 refills | Status: DC

## 2021-07-11 MED ORDER — SHINGRIX 50 MCG/0.5ML IM SUSR: 0.5000 mL | Freq: Once | INTRAMUSCULAR | 1 refills | Status: AC

## 2021-07-11 NOTE — Patient Instructions (Signed)
Type 2 Diabetes Mellitus, Diagnosis, Adult ?Type 2 diabetes (type 2 diabetes mellitus) is a long-term, or chronic, disease. In type 2 diabetes, one or both of these problems may be present: ?The pancreas does not make enough of a hormone called insulin. ?Cells in the body do not respond properly to the insulin that the body makes (insulin resistance). ?Normally, insulin allows blood sugar (glucose) to enter cells in the body. The cells use glucose for energy. Insulin resistance or lack of insulin causes excess glucose to build up in the blood instead of going into cells. This causes high blood glucose (hyperglycemia).  ?What are the causes? ?The exact cause of type 2 diabetes is not known. ?What increases the risk? ?The following factors may make you more likely to develop this condition: ?Having a family member with type 2 diabetes. ?Being overweight or obese. ?Being inactive (sedentary). ?Having been diagnosed with insulin resistance. ?Having a history of prediabetes, diabetes when you were pregnant (gestational diabetes), or polycystic ovary syndrome (PCOS). ?What are the signs or symptoms? ?In the early stage of this condition, you may not have symptoms. Symptoms develop slowly and may include: ?Increased thirst or hunger. ?Increased urination. ?Unexplained weight loss. ?Tiredness (fatigue) or weakness. ?Vision changes, such as blurry vision. ?Dark patches on the skin. ?How is this diagnosed? ?This condition is diagnosed based on your symptoms, your medical history, a physical exam, and your blood glucose level. Your blood glucose may be checked with one or more of the following blood tests: ?A fasting blood glucose (FBG) test. You will not be allowed to eat (you will fast) for 8 hours or longer before a blood sample is taken. ?A random blood glucose test. This test checks blood glucose at any time of day regardless of when you ate. ?An A1C (hemoglobin A1C) blood test. This test provides information about blood  glucose levels over the previous 2-3 months. ?An oral glucose tolerance test (OGTT). This test measures your blood glucose at two times: ?After fasting. This is your baseline blood glucose level. ?Two hours after drinking a beverage that contains glucose. ?You may be diagnosed with type 2 diabetes if: ?Your fasting blood glucose level is 126 mg/dL (7.0 mmol/L) or higher. ?Your random blood glucose level is 200 mg/dL (11.1 mmol/L) or higher. ?Your A1C level is 6.5% or higher. ?Your oral glucose tolerance test result is higher than 200 mg/dL (11.1 mmol/L). ?These blood tests may be repeated to confirm your diagnosis. ?How is this treated? ?Your treatment may be managed by a specialist called an endocrinologist. Type 2 diabetes may be treated by following instructions from your health care provider about: ?Making dietary and lifestyle changes. These may include: ?Following a personalized nutrition plan that is developed by a registered dietitian. ?Exercising regularly. ?Finding ways to manage stress. ?Checking your blood glucose level as often as told. ?Taking diabetes medicines or insulin daily. This helps to keep your blood glucose levels in the healthy range. ?Taking medicines to help prevent complications from diabetes. Medicines may include: ?Aspirin. ?Medicine to lower cholesterol. ?Medicine to control blood pressure. ?Your health care provider will set treatment goals for you. Your goals will be based on your age, other medical conditions you have, and how you respond to diabetes treatment. Generally, the goal of treatment is to maintain the following blood glucose levels: ?Before meals: 80-130 mg/dL (4.4-7.2 mmol/L). ?After meals: below 180 mg/dL (10 mmol/L). ?A1C level: less than 7%. ?Follow these instructions at home: ?Questions to ask your health care provider ?  Consider asking the following questions: ?Should I meet with a certified diabetes care and education specialist? ?What diabetes medicines do I need,  and when should I take them? ?What equipment will I need to manage my diabetes at home? ?How often do I need to check my blood glucose? ?Where can I find a support group for people with diabetes? ?What number can I call if I have questions? ?When is my next appointment? ?General instructions ?Take over-the-counter and prescription medicines only as told by your health care provider. ?Keep all follow-up visits. This is important. ?Where to find more information ?For help and guidance and for more information about diabetes, please visit: ?American Diabetes Association (ADA): www.diabetes.org ?American Association of Diabetes Care and Education Specialists (ADCES): www.diabeteseducator.org ?International Diabetes Federation (IDF): www.idf.org ?Contact a health care provider if: ?Your blood glucose is at or above 240 mg/dL (13.3 mmol/L) for 2 days in a row. ?You have been sick or have had a fever for 2 days or longer, and you are not getting better. ?You have any of the following problems for more than 6 hours: ?You cannot eat or drink. ?You have nausea and vomiting. ?You have diarrhea. ?Get help right away if: ?You have severe hypoglycemia. This means your blood glucose is lower than 54 mg/dL (3.0 mmol/L). ?You become confused or you have trouble thinking clearly. ?You have difficulty breathing. ?You have moderate or large ketone levels in your urine. ?These symptoms may represent a serious problem that is an emergency. Do not wait to see if the symptoms will go away. Get medical help right away. Call your local emergency services (911 in the [place] Do not drive yourself to the hospital. ?Summary ?Type 2 diabetes mellitus is a long-term, or chronic, disease. In type 2 diabetes, the pancreas does not make enough of a hormone called insulin, or cells in the body do not respond properly to insulin that the body makes. ?This condition is treated by making dietary and lifestyle changes and taking diabetes medicines or  insulin. ?Your health care provider will set treatment goals for you. Your goals will be based on your age, other medical conditions you have, and how you respond to diabetes treatment. ?Keep all follow-up visits. This is important. ?This information is not intended to replace advice given to you by your health care provider. Make sure you discuss any questions you have with your health care provider. ?Document Revised: 06/28/2020 Document Reviewed: 06/28/2020 ?Elsevier Patient Education ? 2022 Elsevier Inc. ? ?

## 2021-07-11 NOTE — Progress Notes (Signed)
? ?Subjective:  ?Patient ID: Tim Decker, male    DOB: May 31, 1955  Age: 66 y.o. MRN: [id number] ? ?CC: Hypertension, Hyperlipidemia, and Diabetes ? ?This visit occurred during the SARS-CoV-2 public health emergency.  Safety protocols were in place, including screening questions prior to the visit, additional usage of staff PPE, and extensive cleaning of exam room while observing appropriate contact time as indicated for disinfecting solutions.   ? ?HPI ?Tim Decker presents for f/up - ? ?He stopped taking Vascepa and Wilder Glade because they were too expensive.  He is active and denies chest pain, shortness of breath, diaphoresis, or edema.  He has lost weight with lifestyle modifications. ? ?Outpatient Medications Prior to Visit  ?Medication Sig Dispense Refill  ? amLODipine (NORVASC) 10 MG tablet Take 1 tablet (10 mg total) by mouth daily. 90 tablet 0  ? Ascorbic Acid (VITAMIN C) 1000 MG tablet Take 1,000 mg by mouth daily.    ? aspirin 81 MG tablet Take 81 mg by mouth daily.    ? carvedilol (COREG) 25 MG tablet Take 1 tablet (25 mg total) by mouth 2 (two) times daily. 180 tablet 0  ? Cholecalciferol (VITAMIN D3) 1.25 MG (50000 UT) CAPS Take 1 capsule by mouth once a week 12 capsule 0  ? doxazosin (CARDURA) 2 MG tablet Take 1 tablet (2 mg total) by mouth daily. 180 tablet 0  ? irbesartan (AVAPRO) 300 MG tablet Take 1 tablet (300 mg total) by mouth daily. 90 tablet 0  ? Multiple Vitamin (MULTIVITAMIN PO) Take by mouth.    ? omeprazole (PRILOSEC) 40 MG capsule Take 1 capsule (40 mg total) by mouth daily. 90 capsule 0  ? spironolactone (ALDACTONE) 25 MG tablet Take 1 tablet (25 mg total) by mouth daily. 90 tablet 0  ? rosuvastatin (CRESTOR) 10 MG tablet Take 1 tablet (10 mg total) by mouth daily. 90 tablet 0  ? dapagliflozin propanediol (FARXIGA) 10 MG TABS tablet Take 1 tablet (10 mg total) by mouth daily. (Patient not taking: Reported on 07/11/2021) 90 tablet 1  ? VASCEPA 1 g capsule Take 2 capsules (2 g  total) by mouth 2 (two) times daily. (Patient not taking: Reported on 07/11/2021) 360 capsule 0  ? ?No facility-administered medications prior to visit.  ? ? ?ROS ?Review of Systems  ?Constitutional: Negative.  Negative for diaphoresis and fatigue.  ?HENT: Negative.    ?Eyes: Negative.   ?Respiratory:  Negative for cough, chest tightness and wheezing.   ?Cardiovascular:  Negative for chest pain, palpitations and leg swelling.  ?Gastrointestinal:  Negative for abdominal pain, constipation, diarrhea, nausea and vomiting.  ?Endocrine: Negative.   ?Genitourinary: Negative.  Negative for difficulty urinating.  ?Musculoskeletal:  Negative for arthralgias and myalgias.  ?Skin: Negative.  Negative for color change.  ?Neurological: Negative.  Negative for dizziness and weakness.  ?Hematological:  Negative for adenopathy. Does not bruise/bleed easily.  ?Psychiatric/Behavioral: Negative.    ? ?Objective:  ?BP 128/72 (BP Location: Right Arm, Patient Position: Sitting, Cuff Size: Large)   Pulse 62   Temp 98.1 ?F (36.7 ?C) (Oral)   Resp 16   Ht 5\' 9"  (1.753 m)   Wt 223 lb (101.2 kg)   SpO2 97%   BMI 32.93 kg/m?  ? ?BP Readings from Last 3 Encounters:  ?07/11/21 128/72  ?05/11/21 130/84  ?02/08/21 122/78  ? ? ?Wt Readings from Last 3 Encounters:  ?07/11/21 223 lb (101.2 kg)  ?05/11/21 243 lb 8 oz (110.5 kg)  ?02/08/21 244 lb 3.2 oz (  110.8 kg)  ? ? ?Physical Exam ?Vitals reviewed.  ?Constitutional:   ?   Appearance: He is not ill-appearing.  ?HENT:  ?   Mouth/Throat:  ?   Mouth: Mucous membranes are moist.  ?Eyes:  ?   General: No scleral icterus. ?   Conjunctiva/sclera: Conjunctivae normal.  ?Cardiovascular:  ?   Rate and Rhythm: Normal rate and regular rhythm.  ?   Heart sounds: No murmur heard. ?Pulmonary:  ?   Effort: Pulmonary effort is normal.  ?   Breath sounds: No stridor. No wheezing, rhonchi or rales.  ?Abdominal:  ?   General: Abdomen is flat.  ?   Palpations: There is no mass.  ?   Tenderness: There is no  abdominal tenderness. There is no guarding.  ?   Hernia: No hernia is present.  ?Musculoskeletal:     ?   General: Normal range of motion.  ?   Cervical back: Neck supple.  ?   Right lower leg: No edema.  ?   Left lower leg: No edema.  ?Lymphadenopathy:  ?   Cervical: No cervical adenopathy.  ?Skin: ?   General: Skin is warm and dry.  ?Neurological:  ?   General: No focal deficit present.  ?   Mental Status: He is alert.  ?Psychiatric:     ?   Mood and Affect: Mood normal.     ?   Behavior: Behavior normal.  ? ? ?Lab Results  ?Component Value Date  ? WBC 6.5 07/11/2021  ? HGB 15.8 07/11/2021  ? HCT 47.2 07/11/2021  ? PLT 246.0 07/11/2021  ? GLUCOSE 101 (H) 07/11/2021  ? CHOL 119 07/11/2021  ? TRIG 259.0 (H) 07/11/2021  ? HDL 25.30 (L) 07/11/2021  ? LDLDIRECT 59.0 07/11/2021  ? LDLCALC 53 05/11/2021  ? ALT 36 11/24/2020  ? AST 25 11/24/2020  ? NA 135 07/11/2021  ? K 4.7 07/11/2021  ? CL 102 07/11/2021  ? CREATININE 1.73 (H) 07/11/2021  ? BUN 32 (H) 07/11/2021  ? CO2 25 07/11/2021  ? TSH 2.21 11/24/2020  ? PSA 1.14 11/24/2020  ? HGBA1C 6.4 07/11/2021  ? MICROALBUR 37.0 (H) 11/24/2020  ? ? ?DG Chest 2 View ? ?Result Date: 02/10/2019 ?CLINICAL DATA:  Hypertension. EXAM: CHEST - 2 VIEW COMPARISON:  None. FINDINGS: The heart size and mediastinal contours are within normal limits. Both lungs are clear. The visualized skeletal structures are unremarkable. IMPRESSION: No active cardiopulmonary disease. Electronically Signed   By: Lupita Raider M.D.   On: 02/10/2019 09:31  ? ? ?Assessment & Plan:  ? ?Tim Decker was seen today for hypertension, hyperlipidemia and diabetes. ? ?Diagnoses and all orders for this visit: ? ?Diabetes mellitus type 2 in obese [facility])- His blood sugar is adequately well controlled. ?-     Hemoglobin A1c; Future ?-     Ambulatory referral to Ophthalmology ?-     Hemoglobin A1c ? ?Hyperlipidemia LDL goal <130- LDL goal achieved. Doing well on the statin  ?-     rosuvastatin (CRESTOR) 10 MG tablet; Take 1  tablet (10 mg total) by mouth daily. ?-     Lipid panel; Future ?-     Lipid panel ? ?Positive colorectal cancer screening using Cologuard test ?-     Cancel: Ambulatory referral to Gastroenterology ?-     Ambulatory referral to Gastroenterology ? ?Pure hyperglyceridemia ?-     Lipid panel; Future ?-     omega-3 acid ethyl esters (LOVAZA) 1 g capsule;  Take 2 capsules (2 g total) by mouth 2 (two) times daily. ?-     Lipid panel ? ?Stage 3a chronic kidney disease ([place])- His renal function is stable.  Will discontinue Wilder Glade due to the cost.  He is avoiding nephrotoxic agents. ?-     Basic metabolic panel; Future ?-     CBC with Differential/Platelet; Future ?-     CBC with Differential/Platelet ?-     Basic metabolic panel ? ?Diabetes mellitus with proteinuric diabetic nephropathy ([place]) ? ?Need for shingles vaccine ?-     Zoster Vaccine Adjuvanted [facility]) injection; Inject 0.5 mLs into the muscle once for 1 dose. ? ?Need for prophylactic vaccination with combined diphtheria-tetanus-pertussis (DTP) vaccine ?-     Tdap (BOOSTRIX) 5-2.5-18.5 LF-MCG/0.5 injection; Inject 0.5 mLs into the muscle once for 1 dose. ? ?Other orders ?-     LDL cholesterol, direct ? ? ?I have discontinued Damiean T. Brusseau's dapagliflozin propanediol and Vascepa. I am also having him start on omega-3 acid ethyl esters, Shingrix, and Boostrix. Additionally, I am having him maintain his aspirin, Vitamin D3, vitamin C, Multiple Vitamin (MULTIVITAMIN PO), amLODipine, carvedilol, irbesartan, omeprazole, spironolactone, doxazosin, and rosuvastatin. ? ?Meds ordered this encounter  ?Medications  ? rosuvastatin (CRESTOR) 10 MG tablet  ?  Sig: Take 1 tablet (10 mg total) by mouth daily.  ?  Dispense:  90 tablet  ?  Refill:  1  ? omega-3 acid ethyl esters (LOVAZA) 1 g capsule  ?  Sig: Take 2 capsules (2 g total) by mouth 2 (two) times daily.  ?  Dispense:  360 capsule  ?  Refill:  1  ? Zoster Vaccine Adjuvanted [employer]) injection  ?  Sig: Inject 0.5 mLs  into the muscle once for 1 dose.  ?  Dispense:  0.5 mL  ?  Refill:  1  ? Tdap (BOOSTRIX) 5-2.5-18.5 LF-MCG/0.5 injection  ?  Sig: Inject 0.5 mLs into the muscle once for 1 dose.  ?  Dispense:  0.5 mL  ?  Refill:  0  ? ?

## 2021-08-29 ENCOUNTER — Other Ambulatory Visit: Payer: Self-pay | Admitting: Internal Medicine

## 2021-08-29 DIAGNOSIS — I10 Essential (primary) hypertension: Secondary | ICD-10-CM

## 2021-08-29 DIAGNOSIS — I119 Hypertensive heart disease without heart failure: Secondary | ICD-10-CM

## 2021-08-29 DIAGNOSIS — E876 Hypokalemia: Secondary | ICD-10-CM

## 2021-09-02 ENCOUNTER — Other Ambulatory Visit: Payer: Self-pay | Admitting: Internal Medicine

## 2021-09-02 DIAGNOSIS — K219 Gastro-esophageal reflux disease without esophagitis: Secondary | ICD-10-CM

## 2021-09-02 DIAGNOSIS — E1121 Type 2 diabetes mellitus with diabetic nephropathy: Secondary | ICD-10-CM

## 2021-09-02 DIAGNOSIS — I119 Hypertensive heart disease without heart failure: Secondary | ICD-10-CM

## 2021-09-02 DIAGNOSIS — I10 Essential (primary) hypertension: Secondary | ICD-10-CM

## 2021-09-02 DIAGNOSIS — R809 Proteinuria, unspecified: Secondary | ICD-10-CM

## 2021-09-02 DIAGNOSIS — E559 Vitamin D deficiency, unspecified: Secondary | ICD-10-CM

## 2021-09-09 ENCOUNTER — Ambulatory Visit (INDEPENDENT_AMBULATORY_CARE_PROVIDER_SITE_OTHER): Payer: No Typology Code available for payment source | Admitting: Cardiology

## 2021-09-09 ENCOUNTER — Encounter (HOSPITAL_BASED_OUTPATIENT_CLINIC_OR_DEPARTMENT_OTHER): Payer: Self-pay | Admitting: Cardiology

## 2021-09-09 VITALS — BP 134/76 | HR 66 | Ht 69.0 in | Wt 225.0 lb

## 2021-09-09 DIAGNOSIS — I428 Other cardiomyopathies: Secondary | ICD-10-CM

## 2021-09-09 DIAGNOSIS — E1169 Type 2 diabetes mellitus with other specified complication: Secondary | ICD-10-CM

## 2021-09-09 DIAGNOSIS — E669 Obesity, unspecified: Secondary | ICD-10-CM

## 2021-09-09 DIAGNOSIS — E782 Mixed hyperlipidemia: Secondary | ICD-10-CM

## 2021-09-09 DIAGNOSIS — I119 Hypertensive heart disease without heart failure: Secondary | ICD-10-CM

## 2021-09-09 DIAGNOSIS — Z720 Tobacco use: Secondary | ICD-10-CM

## 2021-09-09 DIAGNOSIS — E1121 Type 2 diabetes mellitus with diabetic nephropathy: Secondary | ICD-10-CM

## 2021-09-09 DIAGNOSIS — N1832 Chronic kidney disease, stage 3b: Secondary | ICD-10-CM

## 2021-09-09 DIAGNOSIS — R809 Proteinuria, unspecified: Secondary | ICD-10-CM

## 2021-09-09 DIAGNOSIS — I10 Essential (primary) hypertension: Secondary | ICD-10-CM

## 2021-09-09 MED ORDER — IRBESARTAN 300 MG PO TABS: 300.0000 mg | ORAL_TABLET | Freq: Every day | ORAL | 3 refills | Status: DC

## 2021-09-09 NOTE — Progress Notes (Signed)
Cardiology Office Note:    Date:  09/09/2021   ID:  Forrest Jaroszewski, DOB 1956-02-21, MRN [id number]  PCP:  Janith Lima, MD  Cardiologist:  Buford Dresser, MD  Referring MD: Janith Lima, MD   CC: Follow-up  History of Present Illness:    Tim Decker is a 66 y.o. male with a hx of cardiomyopathy, hyperlipidemia, hypertension, and pre-diabetes, who is seen for follow-up. I initially met him 02/08/2021 as a new consult at the request of Janith Lima, MD for the evaluation and management of abnormal electrocardiogram results.   CV history: echo 2020 with EF 35-40%. Severe LVH. Myoview 2020 EF 44%, mild diffuse hypokinesis. Small fixed mild defect in apical area, no ischemia.  At his last appointment his at home BP was usually controlled, similar to his clinic reading of 130/84. He complained of mild palpitations at times. Nebivolol was switched to carvedilol.  Today: He is feeling good. His blood pressure is elevated in clinic today at 149/83 (134/76 on recheck). 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  Until about a week ago he was going to the gym daily for exercise. This is because he is preparing for a trip to [place] next week. He has successfully lost some weight as he was 243 lbs, and is now 225 lbs today. He also walks in the mornings.  He has been taking OTC omega-3 supplements instead of his prescribed omega-3 due to excessive costs.  He denies any palpitations, chest pain, shortness of breath, or peripheral edema. No lightheadedness, headaches, syncope, orthopnea, or PND.   Past Medical History:  Diagnosis Date   Hyperlipidemia    Hypertension    Pre-diabetes    A1c 6%    Past Surgical History:  Procedure Laterality Date   colonoscopy with polypectomy  2009   [facility]   TONSILLECTOMY AND ADENOIDECTOMY     WISDOM TOOTH EXTRACTION     two  extraction    Current Medications: Current Outpatient Medications on File Prior to Visit  Medication Sig   amLODipine (NORVASC) 10 MG tablet Take 1 tablet by mouth once daily   Ascorbic Acid (VITAMIN C) 1000 MG tablet Take 1,000 mg by mouth daily.   aspirin 81 MG tablet Take 81 mg by mouth daily.   carvedilol (COREG) 25 MG tablet Take 1 tablet (25 mg total) by mouth 2 (two) times daily.   Cholecalciferol (VITAMIN D3) 1.25 MG (50000 UT) CAPS Take 1 capsule by mouth once a week   doxazosin (CARDURA) 2 MG tablet Take 1 tablet (2 mg total) by mouth daily.   irbesartan (AVAPRO) 300 MG tablet Take 1 tablet by mouth once daily   Multiple Vitamin (MULTIVITAMIN PO) Take by mouth.   omeprazole (PRILOSEC) 40 MG capsule Take 1 capsule by mouth once daily   rosuvastatin (CRESTOR) 10 MG tablet Take 1 tablet (10 mg total) by mouth daily.   spironolactone (ALDACTONE) 25 MG tablet Take 1 tablet by mouth once daily   omega-3 acid ethyl esters (LOVAZA) 1 g capsule Take 2 capsules (2 g total) by mouth 2 (two) times daily. (Patient not taking: Reported on 09/09/2021)   No current facility-administered medications on file prior to visit.     Allergies:   Metformin and related   Social History   Tobacco Use   Smoking status: Every Day    Packs/day: 0.25  Years: 41.00    Pack years: 10.25    Types: Cigarettes    Last attempt to quit: 04/18/1991    Years since quitting: 30.4    Passive exposure: Never   Smokeless tobacco: Never   Tobacco comments:    smoked 1972-1993, up to 1/2 ppd. As of 02/24/15 5 cig/day  Vaping Use   Vaping Use: Some days  Substance Use Topics   Alcohol use: Not Currently    Comment:  1 mixed drink/week   Drug use: No    Family History: family history includes Diabetes in his mother; Heart attack in his paternal grandfather; Heart attack (age of onset: 64) in his father. There is no history of Cancer or Stroke. Father died at 12 yo of second or third heart attack. Mother has  diabetes, on insulin.  ROS:   Please see the history of present illness. All other systems are reviewed and negative.    EKGs/Labs/Other Studies Reviewed:    The following studies were reviewed today:  Echo 05/20/2021: Sonographer Comments: Patient is morbidly obese and suboptimal parasternal  window. Image acquisition challenging due to patient body habitus.  IMPRESSIONS     1. Left ventricular ejection fraction, by estimation, is 55 to 60%. The  left ventricle has normal function. The left ventricle has no regional  wall motion abnormalities. There is mild concentric left ventricular  hypertrophy. Left ventricular diastolic  parameters are indeterminate. The average left ventricular global  longitudinal strain is -13.5 %. The global longitudinal strain is  abnormal.   2. Right ventricular systolic function is normal. The right ventricular  size is normal.   3. Left atrial size was mildly dilated.   4. The mitral valve is grossly normal. Trivial mitral valve  regurgitation. No evidence of mitral stenosis.   5. The aortic valve is grossly normal. There is mild calcification of the  aortic valve. Aortic valve regurgitation is not visualized. No aortic  stenosis is present.   6. Aortic dilatation noted. There is borderline dilatation of the  ascending aorta, measuring 38 mm.   Comparison(s): Changes from prior study are noted. EF 35%, severe LVH, AOV  calcifications mild-mod, GLS -15.9%.   Conclusion(s)/Recommendation(s): EF significantly improved (EF within  normal limits, strain mildly abnormal) compared to prior.   Echo 02/26/2019:  1. Left ventricular ejection fraction, by visual estimation, is 35 to  40%. The left ventricle has mildly decreased function. There is severely  increased left ventricular hypertrophy.   2. Left ventricular diastolic parameters are indeterminate.   3. Global right ventricle has normal systolic function.The right  ventricular size is normal.  No increase in right ventricular wall  thickness.   4. Left atrial size was normal.   5. Right atrial size was normal.   6. Mild to moderate aortic valve annular calcification.   7. The mitral valve is normal in structure. No evidence of mitral valve  regurgitation. No evidence of mitral stenosis.   8. The tricuspid valve is normal in structure. Tricuspid valve  regurgitation is not demonstrated.   9. The aortic valve is tricuspid. Aortic valve regurgitation is not  visualized.  10. There is Mild thickening of the aortic valve.  11. There is Moderate calcification of the aortic valve.  12. The pulmonic valve was normal in structure. Pulmonic valve  regurgitation is not visualized.  13. The atrial septum is grossly normal.  14. The average left ventricular global longitudinal strain is -15.9 %.   Nuclear Stress Test 02/19/2019:  Nuclear stress EF: 44%. Mild diffuse hypokinesis The left ventricular ejection fraction is mildly decreased (45-54%). There was no ST segment deviation noted during stress. Defect 1: There is a small defect of mild severity present in the apical inferior and apical lateral location. Fixed defect. This is an intermediate risk study based upon reduced EF. Consider echocardiogram for verification. There is no ischemia identified.  Carotid Duplex 05/16/2017: Final Interpretation:  Right Carotid: Velocities in the right ICA are consistent with a 1-39%  stenosis.   Left Carotid: Velocities in the left ICA are consistent with a 1-39%  stenosis.   Vertebrals:  Both vertebral arteries were patent with antegrade flow.  Subclavians: Normal flow hemodynamics were seen in bilateral subclavian arteries.   EKG:  EKG is personally reviewed.   09/09/2021: not ordered 05/11/2021: not ordered 02/08/2021: Normal sinus rhythm at 74 bpm. Incomplete RBBB, borderline LVH  Recent Labs: 11/24/2020: ALT 36; Pro B Natriuretic peptide (BNP) 24.0; TSH 2.21 07/11/2021: BUN 32;  Creatinine, Ser 1.73; Hemoglobin 15.8; Platelets 246.0; Potassium 4.7; Sodium 135   Recent Lipid Panel    Component Value Date/Time   CHOL 119 07/11/2021 1346   CHOL 162 05/11/2021 1050   CHOL 185 01/15/2014 0836   TRIG 259.0 (H) 07/11/2021 1346   TRIG 327 (H) 01/15/2014 0836   TRIG 307 (HH) 02/20/2006 0839   HDL 25.30 (L) 07/11/2021 1346   HDL 24 (L) 05/11/2021 1050   HDL 27 (L) 01/15/2014 0836   CHOLHDL 5 07/11/2021 1346   VLDL 51.8 (H) 07/11/2021 1346   LDLCALC 53 05/11/2021 1050   LDLCALC 93 01/15/2014 0836   LDLDIRECT 59.0 07/11/2021 1346    Physical Exam:    VS:  BP 134/76 (BP Location: Right Arm, Patient Position: Sitting, Cuff Size: Normal)   Pulse 66   Ht 5' 9"  (1.753 m)   Wt 225 lb (102.1 kg)   SpO2 96%   BMI 33.23 kg/m     Wt Readings from Last 3 Encounters:  09/09/21 225 lb (102.1 kg)  07/11/21 223 lb (101.2 kg)  05/11/21 243 lb 8 oz (110.5 kg)    GEN: Well nourished, well developed in no acute distress HEENT: Normal, moist mucous membranes NECK: No JVD CARDIAC: regular rhythm, normal S1 and S2, no rubs or gallops. No murmur. VASCULAR: Radial and DP pulses 2+ bilaterally. No carotid bruits RESPIRATORY:  Clear to auscultation without rales, wheezing or rhonchi  ABDOMEN: Soft, non-tender, non-distended MUSCULOSKELETAL:  Ambulates independently SKIN: Warm and dry, no edema NEUROLOGIC:  Alert and oriented x 3. No focal neuro deficits noted. PSYCHIATRIC:  Normal affect    ASSESSMENT:    1. Hypertensive left ventricular hypertrophy, without heart failure   2. Essential hypertension   3. NICM (nonischemic cardiomyopathy) ([place])   4. Diabetes mellitus with proteinuric diabetic nephropathy ([facility])   5. Stage 3b chronic kidney disease ([place])   6. Mixed hyperlipidemia   7. Diabetes mellitus type 2 in obese ([place])   8. Tobacco abuse     PLAN:    Cardiomyopathy, suspect nonischemic due to stress test, likely 2/2 hypertensive heart disease Severe LVH Chronic  kidney disease, most recent stage 3b (GFR 40)  -NYHA class I -EF 35-40% in 02/2019-->normalized to 55-60% 05/2021 -had myoview at that time without ischemia, has never had cath -on carvedilol -on spironolactone, irbesartan -was on dapagliflozin, no longer taking -we discussed entresto, declined due to cost  Hypertension: -on carvedilol, amlodipine, spironolactone, irbesartan, doxazosin -ran out of irbesartan, refilled today -discussed checking  home BP, contacting me if consistently >130/80  Mixed hyperlipidemia -continue rosuvastatin -no longer taking lovaza 2/2 cost -last LDL 53, TG 259  Type II diabetes, with proteinuria Obesity -on aspirin, rosuvastatin -no longer on dapagliflozin -last A1c 6.4  Tobacco abuse: not ready to quit  Cardiac risk counseling and prevention recommendations: has family history of heart disease -recommend heart healthy/Mediterranean diet, with whole grains, fruits, vegetable, fish, lean meats, nuts, and olive oil. Limit salt. -recommend moderate walking, 3-5 times/week for 30-50 minutes each session. Aim for at least 150 minutes.week. Goal should be pace of 3 miles/hours, or walking 1.5 miles in 30 minutes -recommend avoidance of tobacco products. Avoid excess alcohol. -ASCVD risk score: The ASCVD Risk score (Arnett DK, et al., 2019) failed to calculate for the following reasons:   The valid total cholesterol range is 130 to 320 mg/dL    Plan for follow up: 6 months or sooner as needed.  Buford Dresser, MD, PhD, [facility]    Medication Adjustments/Labs and Tests Ordered: Current medicines are reviewed at length with the patient today.  Concerns regarding medicines are outlined above.   No orders of the defined types were placed in this encounter.  Meds ordered this encounter  Medications   irbesartan (AVAPRO) 300 MG tablet    Sig: Take 1 tablet (300 mg total) by mouth daily.    Dispense:  90 tablet    Refill:   3   Patient Instructions  Medication Instructions:  Your Physician recommend you continue on your current medication as directed.    *If you need a refill on your cardiac medications before your next appointment, please call your pharmacy*   Lab Work: None ordered today   Testing/Procedures: None ordered today   Follow-Up: At [facility], you and your health needs are our priority.  As part of our continuing mission to provide you with exceptional heart care, we have created designated Provider Care Teams.  These Care Teams include your primary Cardiologist (physician) and Advanced Practice Providers (APPs -  Physician Assistants and Nurse Practitioners) who all work together to provide you with the care you need, when you need it.  We recommend signing up for the patient portal called "MyChart".  Sign up information is provided on this After Visit Summary.  MyChart is used to connect with patients for Virtual Visits (Telemedicine).  Patients are able to view lab/test results, encounter notes, upcoming appointments, etc.  Non-urgent messages can be sent to your provider as well.   To learn more about what you can do with MyChart, go to [email or web address].    Your next appointment:   6 month(s)  The format for your next appointment:   In Person  Provider:   Buford Dresser, MD             [facility] Stumpf,acting as a scribe for Buford Dresser, MD.,have documented all relevant documentation on the behalf of Buford Dresser, MD,as directed by  Buford Dresser, MD while in the presence of Buford Dresser, MD.  I, Buford Dresser, MD, have reviewed all documentation for this visit. The documentation on 09/09/21 for the exam, diagnosis, procedures, and orders are all accurate and complete.   Signed, Buford Dresser, MD PhD 09/09/2021     [place]

## 2021-09-09 NOTE — Patient Instructions (Signed)

## 2021-09-11 ENCOUNTER — Other Ambulatory Visit: Payer: Self-pay | Admitting: Internal Medicine

## 2021-09-11 DIAGNOSIS — E559 Vitamin D deficiency, unspecified: Secondary | ICD-10-CM

## 2021-09-11 DIAGNOSIS — I10 Essential (primary) hypertension: Secondary | ICD-10-CM

## 2021-09-11 DIAGNOSIS — N400 Enlarged prostate without lower urinary tract symptoms: Secondary | ICD-10-CM

## 2021-09-11 DIAGNOSIS — I119 Hypertensive heart disease without heart failure: Secondary | ICD-10-CM

## 2021-11-04 ENCOUNTER — Ambulatory Visit (INDEPENDENT_AMBULATORY_CARE_PROVIDER_SITE_OTHER): Payer: No Typology Code available for payment source

## 2021-11-04 DIAGNOSIS — Z0000 Encounter for general adult medical examination without abnormal findings: Secondary | ICD-10-CM

## 2021-11-04 NOTE — Patient Instructions (Signed)
[name] , Thank you for taking time to come for your Medicare Wellness Visit. I appreciate your ongoing commitment to your health goals. Please review the following plan we discussed and let me know if I can assist you in the future.   Screening recommendations/referrals: Colonoscopy: patient to call schedule  Recommended yearly ophthalmology/optometry visit for glaucoma screening and checkup Recommended yearly dental visit for hygiene and checkup  Vaccinations: Influenza vaccine: completed  Pneumococcal vaccine: completed  Tdap vaccine: 04/21/2010 Shingles vaccine: due 2nd dose     Advanced directives: none   Conditions/risks identified: none   Next appointment: none  Preventive Care 65 Years and Older, Male Preventive care refers to lifestyle choices and visits with your health care provider that can promote health and wellness. What does preventive care include? A yearly physical exam. This is also called an annual well check. Dental exams once or twice a year. Routine eye exams. Ask your health care provider how often you should have your eyes checked. Personal lifestyle choices, including: Daily care of your teeth and gums. Regular physical activity. Eating a healthy diet. Avoiding tobacco and drug use. Limiting alcohol use. Practicing safe sex. Taking low doses of aspirin every day. Taking vitamin and mineral supplements as recommended by your health care provider. What happens during an annual well check? The services and screenings done by your health care provider during your annual well check will depend on your age, overall health, lifestyle risk factors, and family history of disease. Counseling  Your health care provider may ask you questions about your: Alcohol use. Tobacco use. Drug use. Emotional well-being. Home and relationship well-being. Sexual activity. Eating habits. History of falls. Memory and ability to understand (cognition). Work and work  Astronomer. Screening  You may have the following tests or measurements: Height, weight, and BMI. Blood pressure. Lipid and cholesterol levels. These may be checked every 5 years, or more frequently if you are over 2 years old. Skin check. Lung cancer screening. You may have this screening every year starting at age 55 if you have a 30-pack-year history of smoking and currently smoke or have quit within the past 15 years. Fecal occult blood test (FOBT) of the stool. You may have this test every year starting at age 88. Flexible sigmoidoscopy or colonoscopy. You may have a sigmoidoscopy every 5 years or a colonoscopy every 10 years starting at age 37. Prostate cancer screening. Recommendations will vary depending on your family history and other risks. Hepatitis C blood test. Hepatitis B blood test. Sexually transmitted disease (STD) testing. Diabetes screening. This is done by checking your blood sugar (glucose) after you have not eaten for a while (fasting). You may have this done every 1-3 years. Abdominal aortic aneurysm (AAA) screening. You may need this if you are a current or former smoker. Osteoporosis. You may be screened starting at age 71 if you are at high risk. Talk with your health care provider about your test results, treatment options, and if necessary, the need for more tests. Vaccines  Your health care provider may recommend certain vaccines, such as: Influenza vaccine. This is recommended every year. Tetanus, diphtheria, and acellular pertussis (Tdap, Td) vaccine. You may need a Td booster every 10 years. Zoster vaccine. You may need this after age 38. Pneumococcal 13-valent conjugate (PCV13) vaccine. One dose is recommended after age 47. Pneumococcal polysaccharide (PPSV23) vaccine. One dose is recommended after age 21. Talk to your health care provider about which screenings and vaccines you need  and how often you need them. This information is not intended to replace  advice given to you by your health care provider. Make sure you discuss any questions you have with your health care provider. Document Released: 04/30/2015 Document Revised: 12/22/2015 Document Reviewed: 02/02/2015 Elsevier Interactive Patient Education  2017 [place] Prevention in the Home Falls can cause injuries. They can happen to people of all ages. There are many things you can do to make your home safe and to help prevent falls. What can I do on the outside of my home? Regularly fix the edges of walkways and driveways and fix any cracks. Remove anything that might make you trip as you walk through a door, such as a raised step or threshold. Trim any bushes or trees on the path to your home. Use bright outdoor lighting. Clear any walking paths of anything that might make someone trip, such as rocks or tools. Regularly check to see if handrails are loose or broken. Make sure that both sides of any steps have handrails. Any raised decks and porches should have guardrails on the edges. Have any leaves, snow, or ice cleared regularly. Use sand or salt on walking paths during winter. Clean up any spills in your garage right away. This includes oil or grease spills. What can I do in the bathroom? Use night lights. Install grab bars by the toilet and in the tub and shower. Do not use towel bars as grab bars. Use non-skid mats or decals in the tub or shower. If you need to sit down in the shower, use a plastic, non-slip stool. Keep the floor dry. Clean up any water that spills on the floor as soon as it happens. Remove soap buildup in the tub or shower regularly. Attach bath mats securely with double-sided non-slip rug tape. Do not have throw rugs and other things on the floor that can make you trip. What can I do in the bedroom? Use night lights. Make sure that you have a light by your bed that is easy to reach. Do not use any sheets or blankets that are too big for your bed.  They should not hang down onto the floor. Have a firm chair that has side arms. You can use this for support while you get dressed. Do not have throw rugs and other things on the floor that can make you trip. What can I do in the kitchen? Clean up any spills right away. Avoid walking on wet floors. Keep items that you use a lot in easy-to-reach places. If you need to reach something above you, use a strong step stool that has a grab bar. Keep electrical cords out of the way. Do not use floor polish or wax that makes floors slippery. If you must use wax, use non-skid floor wax. Do not have throw rugs and other things on the floor that can make you trip. What can I do with my stairs? Do not leave any items on the stairs. Make sure that there are handrails on both sides of the stairs and use them. Fix handrails that are broken or loose. Make sure that handrails are as long as the stairways. Check any carpeting to make sure that it is firmly attached to the stairs. Fix any carpet that is loose or worn. Avoid having throw rugs at the top or bottom of the stairs. If you do have throw rugs, attach them to the floor with carpet tape. Make sure that you  have a light switch at the top of the stairs and the bottom of the stairs. If you do not have them, ask someone to add them for you. What else can I do to help prevent falls? Wear shoes that: Do not have high heels. Have rubber bottoms. Are comfortable and fit you well. Are closed at the toe. Do not wear sandals. If you use a stepladder: Make sure that it is fully opened. Do not climb a closed stepladder. Make sure that both sides of the stepladder are locked into place. Ask someone to hold it for you, if possible. Clearly mark and make sure that you can see: Any grab bars or handrails. First and last steps. Where the edge of each step is. Use tools that help you move around (mobility aids) if they are needed. These  include: Canes. Walkers. Scooters. Crutches. Turn on the lights when you go into a dark area. Replace any light bulbs as soon as they burn out. Set up your furniture so you have a clear path. Avoid moving your furniture around. If any of your floors are uneven, fix them. If there are any pets around you, be aware of where they are. Review your medicines with your doctor. Some medicines can make you feel dizzy. This can increase your chance of falling. Ask your doctor what other things that you can do to help prevent falls. This information is not intended to replace advice given to you by your health care provider. Make sure you discuss any questions you have with your health care provider. Document Released: 01/28/2009 Document Revised: 09/09/2015 Document Reviewed: 05/08/2014 Elsevier Interactive Patient Education  2017 [employer].

## 2021-11-04 NOTE — Progress Notes (Signed)
Subjective:   Tim Decker is a 66 y.o. male who presents for an Subsequent  Medicare Annual Wellness Visit.   I connected with Mariann Barter  today by telephone and verified that I am speaking with the correct person using two identifiers. Location patient: home Location provider: work Persons participating in the virtual visit: patient, provider.   I discussed the limitations, risks, security and privacy concerns of performing an evaluation and management service by telephone and the availability of in person appointments. I also discussed with the patient that there may be a patient responsible charge related to this service. The patient expressed understanding and verbally consented to this telephonic visit.    Interactive audio and video telecommunications were attempted between this provider and patient, however failed, due to patient having technical difficulties OR patient did not have access to video capability.  We continued and completed visit with audio only.    Review of Systems           Objective:    Today's Vitals   There is no height or weight on file to calculate BMI.     11/04/2021    1:17 PM 02/10/2019    9:14 AM  Advanced Directives  Does Patient Have a Medical Advance Directive? No No  Would patient like information on creating a medical advance directive? No - Patient declined No - Patient declined    Current Medications (verified) Outpatient Encounter Medications as of 11/04/2021  Medication Sig   amLODipine (NORVASC) 10 MG tablet Take 1 tablet by mouth once daily   Ascorbic Acid (VITAMIN C) 1000 MG tablet Take 1,000 mg by mouth daily.   aspirin 81 MG tablet Take 81 mg by mouth daily.   carvedilol (COREG) 25 MG tablet Take 1 tablet (25 mg total) by mouth 2 (two) times daily.   Cholecalciferol (VITAMIN D3) 1.25 MG (50000 UT) CAPS Take 1 capsule by mouth once a week   doxazosin (CARDURA) 2 MG tablet Take 1 tablet by mouth once daily   irbesartan  (AVAPRO) 300 MG tablet Take 1 tablet (300 mg total) by mouth daily.   Multiple Vitamin (MULTIVITAMIN PO) Take by mouth.   omeprazole (PRILOSEC) 40 MG capsule Take 1 capsule by mouth once daily   rosuvastatin (CRESTOR) 10 MG tablet Take 1 tablet (10 mg total) by mouth daily.   spironolactone (ALDACTONE) 25 MG tablet Take 1 tablet by mouth once daily   No facility-administered encounter medications on file as of 11/04/2021.    Allergies (verified) Metformin and related   History: Past Medical History:  Diagnosis Date   Hyperlipidemia    Hypertension    Pre-diabetes    A1c 6%   Past Surgical History:  Procedure Laterality Date   colonoscopy with polypectomy  2009   Dr [facility]   TONSILLECTOMY AND ADENOIDECTOMY     WISDOM TOOTH EXTRACTION     two extraction   Family History  Problem Relation Age of Onset   Heart attack Father 72   Diabetes Mother    Heart attack Paternal Grandfather        ? 65   Cancer Neg Hx    Stroke Neg Hx    Social History   Socioeconomic History   Marital status: Divorced    Spouse name: Not on file   Number of children: Not on file   Years of education: Not on file   Highest education level: Not on file  Occupational History   Not on  file  Tobacco Use   Smoking status: Every Day    Packs/day: 0.25    Years: 41.00    Total pack years: 10.25    Types: Cigarettes    Last attempt to quit: 04/18/1991    Years since quitting: 30.5    Passive exposure: Never   Smokeless tobacco: Never   Tobacco comments:    smoked 1972-1993, up to 1/2 ppd. As of 02/24/15 5 cig/day  Vaping Use   Vaping Use: Some days  Substance and Sexual Activity   Alcohol use: Not Currently    Comment:  1 mixed drink/week   Drug use: No   Sexual activity: Yes    Partners: Female  Other Topics Concern   Not on file  Social History Narrative   Not on file   Social Determinants of Health   Financial Resource Strain: Low Risk  (11/04/2021)   Overall Financial  Resource Strain (CARDIA)    Difficulty of Paying Living Expenses: Not hard at all  Food Insecurity: No Food Insecurity (11/04/2021)   Hunger Vital Sign    Worried About Running Out of Food in the Last Year: Never true    Ran Out of Food in the Last Year: Never true  Transportation Needs: No Transportation Needs (11/04/2021)   PRAPARE - Administrator, Civil Service (Medical): No    Lack of Transportation (Non-Medical): No  Physical Activity: Sufficiently Active (11/04/2021)   Exercise Vital Sign    Days of Exercise per Week: 5 days    Minutes of Exercise per Session: 30 min  Stress: No Stress Concern Present (11/04/2021)   [employer] of Occupational Health - Occupational Stress Questionnaire    Feeling of Stress : Not at all  Social Connections: Socially Integrated (11/04/2021)   Social Connection and Isolation Panel [NHANES]    Frequency of Communication with Friends and Family: Three times a week    Frequency of Social Gatherings with Friends and Family: Three times a week    Attends Religious Services: 1 to 4 times per year    Active Member of Clubs or Organizations: Yes    Attends Banker Meetings: 1 to 4 times per year    Marital Status: Living with partner    Tobacco Counseling Ready to quit: Not Answered Counseling given: Not Answered Tobacco comments: smoked 1972-1993, up to 1/2 ppd. As of 02/24/15 5 cig/day   Clinical Intake:  Pre-visit preparation completed: Yes  Pain : No/denies pain     Nutritional Risks: None Diabetes: No  How often do you need to have someone help you when you read instructions, pamphlets, or other written materials from your doctor or pharmacy?: 1 - Never What is the last grade level you completed in school?: college  Diabetic?no    Interpreter Needed?: No  Information entered by :: LWilson,LPN   Activities of Daily Living     No data to display          Patient Care Team: Etta Grandchild, MD as  PCP - General (Internal Medicine) Jodelle Red, MD as PCP - Cardiology (Cardiology)  Indicate any recent Medical Services you may have received from other than [facility] providers in the past year (date may be approximate).     Assessment:   This is a routine wellness examination for Tim Decker.  Hearing/Vision screen Vision Screening - Comments:: Patient to call schedule   Dietary issues and exercise activities discussed:     Goals Addressed   None  Depression Screen    11/04/2021    1:17 PM 11/04/2021    1:16 PM 11/24/2020    8:37 AM 09/25/2019    3:38 PM 07/04/2018   11:56 AM 05/10/2017    4:24 PM  PHQ 2/9 Scores  PHQ - 2 Score 0 0 0 0 0 0    Fall Risk    11/04/2021    1:17 PM 11/24/2020    8:37 AM 09/25/2019    3:38 PM 07/04/2018   11:56 AM  Fall Risk   Falls in the past year? 0 0 0 0  Number falls in past yr: 0  0 0  Injury with Fall? 0  0 0  Risk for fall due to : No Fall Risks  No Fall Risks   Follow up Falls evaluation completed;Education provided  Falls evaluation completed Falls evaluation completed    [place]:   Any stairs in or around the home? Yes  If so, are there any without handrails? No  Home free of loose throw rugs in walkways, pet beds, electrical cords, etc? Yes  Adequate lighting in your home to reduce risk of falls? Yes   ASSISTIVE DEVICES UTILIZED TO PREVENT FALLS:  Life alert? No  Use of a cane, walker or w/c? No  Grab bars in the bathroom? Yes  Shower chair or bench in shower? Yes  Elevated toilet seat or a handicapped toilet? Yes    Cognitive Function:  Normal cognitive status assessed by telephone conversation  by this Nurse Health Advisor. No abnormalities found.        Immunizations Immunization History  Administered Date(s) Administered   Fluad Quad(high Dose 65+) 05/12/2020   Influenza Whole 02/03/2008   Influenza,inj,Quad PF,6+ Mos 05/10/2017, 03/03/2018, 02/12/2019    Influenza-Unspecified 02/17/2015   Moderna Sars-Covid-2 Vaccination 07/11/2019, 08/12/2019   Pneumococcal Conjugate-13 05/12/2020   Pneumococcal Polysaccharide-23 07/18/2018   Td 04/21/2010    TDAP status: Up to date  Flu Vaccine status: Up to date  Pneumococcal vaccine status: Up to date  Covid-19 vaccine status: Completed vaccines  Qualifies for Shingles Vaccine? Yes   Zostavax completed Yes   Shingrix Completed?: Yes  Screening Tests Health Maintenance  Topic Date Due   OPHTHALMOLOGY EXAM  Never done   Zoster Vaccines- Shingrix (1 of 2) Never done   COVID-19 Vaccine (3 - Moderna series) 10/07/2019   TETANUS/TDAP  04/21/2020   Fecal DNA (Cologuard)  08/11/2021   Diabetic kidney evaluation - Urine ACR  11/24/2021   INFLUENZA VACCINE  11/15/2021   FOOT EXAM  11/24/2021   HEMOGLOBIN A1C  01/11/2022   Diabetic kidney evaluation - GFR measurement  07/12/2022   Pneumonia Vaccine 42+ Years old (3 - PPSV23 or PCV20) 07/18/2023   Hepatitis C Screening  Completed   HPV VACCINES  Aged Out    Health Maintenance  Health Maintenance Due  Topic Date Due   OPHTHALMOLOGY EXAM  Never done   Zoster Vaccines- Shingrix (1 of 2) Never done   COVID-19 Vaccine (3 - Moderna series) 10/07/2019   TETANUS/TDAP  04/21/2020   Fecal DNA (Cologuard)  08/11/2021   Diabetic kidney evaluation - Urine ACR  11/24/2021    Colorectal cancer screening: Referral to GI placed patient to call schedule . Pt aware the office will call re: appt.  Lung Cancer Screening: (Low Dose CT Chest recommended if Age 54-80 years, 30 pack-year currently smoking OR have quit w/in 15years.) does not qualify.   Lung Cancer Screening  Referral: n/a  Additional Screening:  Hepatitis C Screening: does not qualify;  Vision Screening: Recommended annual ophthalmology exams for early detection of glaucoma and other disorders of the eye. Is the patient up to date with their annual eye exam?  No  Who is the provider or  what is the name of the office in which the patient attends annual eye exams? Patient to call schedule  If pt is not established with a provider, would they like to be referred to a provider to establish care? No .   Dental Screening: Recommended annual dental exams for proper oral hygiene  Community Resource Referral / Chronic Care Management: CRR required this visit?  No   CCM required this visit?  No      Plan:     I have personally reviewed and noted the following in the patient's chart:   Medical and social history Use of alcohol, tobacco or illicit drugs  Current medications and supplements including opioid prescriptions. Patient is not currently taking opioid prescriptions. Functional ability and status Nutritional status Physical activity Advanced directives List of other physicians Hospitalizations, surgeries, and ER visits in previous 12 months Vitals Screenings to include cognitive, depression, and falls Referrals and appointments  In addition, I have reviewed and discussed with patient certain preventive protocols, quality metrics, and best practice recommendations. A written personalized care plan for preventive services as well as general preventive health recommendations were provided to patient.     Randel Pigg, LPN   [id number]   Nurse Notes: none

## 2021-11-15 ENCOUNTER — Other Ambulatory Visit: Payer: Self-pay | Admitting: Internal Medicine

## 2021-11-15 DIAGNOSIS — I119 Hypertensive heart disease without heart failure: Secondary | ICD-10-CM

## 2021-11-15 DIAGNOSIS — I10 Essential (primary) hypertension: Secondary | ICD-10-CM

## 2021-11-15 DIAGNOSIS — K219 Gastro-esophageal reflux disease without esophagitis: Secondary | ICD-10-CM

## 2021-11-15 DIAGNOSIS — E559 Vitamin D deficiency, unspecified: Secondary | ICD-10-CM

## 2021-11-15 DIAGNOSIS — E876 Hypokalemia: Secondary | ICD-10-CM

## 2022-02-07 ENCOUNTER — Other Ambulatory Visit: Payer: Self-pay | Admitting: Internal Medicine

## 2022-02-07 ENCOUNTER — Telehealth: Payer: Self-pay | Admitting: Internal Medicine

## 2022-02-07 DIAGNOSIS — E785 Hyperlipidemia, unspecified: Secondary | ICD-10-CM

## 2022-02-07 MED ORDER — ROSUVASTATIN CALCIUM 10 MG PO TABS: 10.0000 mg | ORAL_TABLET | Freq: Every day | ORAL | 0 refills | Status: DC

## 2022-02-07 NOTE — Telephone Encounter (Signed)
Honor Junes at [facility] called to request refill of 90 day supply of rosuvastain to [place], [place] [address] [place], [place] [address]  Phone:  [phone]  Fax:  [phone]

## 2022-02-13 ENCOUNTER — Other Ambulatory Visit: Payer: Self-pay | Admitting: Internal Medicine

## 2022-02-13 DIAGNOSIS — K219 Gastro-esophageal reflux disease without esophagitis: Secondary | ICD-10-CM

## 2022-02-13 DIAGNOSIS — E559 Vitamin D deficiency, unspecified: Secondary | ICD-10-CM

## 2022-02-13 DIAGNOSIS — N400 Enlarged prostate without lower urinary tract symptoms: Secondary | ICD-10-CM

## 2022-02-13 DIAGNOSIS — I10 Essential (primary) hypertension: Secondary | ICD-10-CM

## 2022-02-13 DIAGNOSIS — E876 Hypokalemia: Secondary | ICD-10-CM

## 2022-02-13 DIAGNOSIS — I119 Hypertensive heart disease without heart failure: Secondary | ICD-10-CM

## 2022-03-01 DIAGNOSIS — N1832 Chronic kidney disease, stage 3b: Secondary | ICD-10-CM | POA: Diagnosis not present

## 2022-03-01 DIAGNOSIS — Z008 Encounter for other general examination: Secondary | ICD-10-CM | POA: Diagnosis not present

## 2022-03-01 DIAGNOSIS — I77810 Thoracic aortic ectasia: Secondary | ICD-10-CM | POA: Diagnosis not present

## 2022-03-01 DIAGNOSIS — Z6834 Body mass index (BMI) 34.0-34.9, adult: Secondary | ICD-10-CM | POA: Diagnosis not present

## 2022-03-01 DIAGNOSIS — I129 Hypertensive chronic kidney disease with stage 1 through stage 4 chronic kidney disease, or unspecified chronic kidney disease: Secondary | ICD-10-CM | POA: Diagnosis not present

## 2022-03-01 DIAGNOSIS — F17210 Nicotine dependence, cigarettes, uncomplicated: Secondary | ICD-10-CM | POA: Diagnosis not present

## 2022-03-01 DIAGNOSIS — I119 Hypertensive heart disease without heart failure: Secondary | ICD-10-CM | POA: Diagnosis not present

## 2022-03-01 DIAGNOSIS — K219 Gastro-esophageal reflux disease without esophagitis: Secondary | ICD-10-CM | POA: Diagnosis not present

## 2022-03-01 DIAGNOSIS — E1122 Type 2 diabetes mellitus with diabetic chronic kidney disease: Secondary | ICD-10-CM | POA: Diagnosis not present

## 2022-03-01 DIAGNOSIS — M545 Low back pain, unspecified: Secondary | ICD-10-CM | POA: Diagnosis not present

## 2022-03-01 DIAGNOSIS — E785 Hyperlipidemia, unspecified: Secondary | ICD-10-CM | POA: Diagnosis not present

## 2022-03-01 DIAGNOSIS — E669 Obesity, unspecified: Secondary | ICD-10-CM | POA: Diagnosis not present

## 2022-03-01 DIAGNOSIS — E1169 Type 2 diabetes mellitus with other specified complication: Secondary | ICD-10-CM | POA: Diagnosis not present

## 2022-03-06 LAB — HM DIABETES EYE EXAM

## 2022-03-15 ENCOUNTER — Ambulatory Visit (HOSPITAL_BASED_OUTPATIENT_CLINIC_OR_DEPARTMENT_OTHER): Payer: No Typology Code available for payment source | Admitting: Cardiology

## 2022-03-15 ENCOUNTER — Encounter (HOSPITAL_BASED_OUTPATIENT_CLINIC_OR_DEPARTMENT_OTHER): Payer: Self-pay | Admitting: Cardiology

## 2022-03-15 VITALS — BP 120/78 | HR 68

## 2022-03-15 DIAGNOSIS — I119 Hypertensive heart disease without heart failure: Secondary | ICD-10-CM

## 2022-03-15 DIAGNOSIS — I428 Other cardiomyopathies: Secondary | ICD-10-CM | POA: Diagnosis not present

## 2022-03-15 DIAGNOSIS — E669 Obesity, unspecified: Secondary | ICD-10-CM

## 2022-03-15 DIAGNOSIS — E1169 Type 2 diabetes mellitus with other specified complication: Secondary | ICD-10-CM | POA: Diagnosis not present

## 2022-03-15 DIAGNOSIS — E782 Mixed hyperlipidemia: Secondary | ICD-10-CM

## 2022-03-15 DIAGNOSIS — Z720 Tobacco use: Secondary | ICD-10-CM

## 2022-03-15 DIAGNOSIS — N1832 Chronic kidney disease, stage 3b: Secondary | ICD-10-CM

## 2022-03-15 DIAGNOSIS — I10 Essential (primary) hypertension: Secondary | ICD-10-CM

## 2022-03-15 NOTE — Progress Notes (Signed)
Cardiology Office Note:    Date:  03/15/2022   ID:  Tim Decker, DOB [birth year], MRN [id number]  PCP:  Janith Lima, MD  Cardiologist:  Buford Dresser, MD  Referring MD: Janith Lima, MD   CC: Follow-up  History of Present Illness:    Tim Decker is a 66 y.o. male with a hx of cardiomyopathy, hyperlipidemia, hypertension, and pre-diabetes, who is seen for follow-up. I initially met him 02/08/2021 as a new consult at the request of Janith Lima, MD for the evaluation and management of abnormal electrocardiogram results.   CV history: echo 2020 with EF 35-40%. Severe LVH. Myoview 2020 EF 44%, mild diffuse hypokinesis. Small fixed mild defect in apical area, no ischemia.  At his last visit his blood pressure was elevated in clinic at 149/83 (134/76 on recheck).  He continued to work on weight loss with some success. He had been taking OTC omega-3 supplements instead of his prescribed omega-3 due to excessive costs.  Today, he appears well. He has had good BP readings at home recently, though he notes that they have been "over the roof" at times. In clinic today his BP reading is 120/78.  For exercise, he used to go the gym everyday, but hasn't been as active as he used to due to staying busy after a recent vacation. 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 He says that he doesn't have a lot of energy at times, which he attributes to his age.  Also he complains of some urinary frequency at night.   He is compliant with his medications and he believes they have been working better than previous medications.  He quit drinking for the most part long time ago. However, he continues to smoke less than a pack a day.  He denies any palpitations, chest pain, shortness of breath, or peripheral edema. No lightheadedness, headaches, syncope, orthopnea, or PND.  For Fhx, his father died of a myocardial  infarction. Smoking is heavy in his family. His sister smokes, and has suffered a stroke and had a CABG procedure .    Past Medical History:  Diagnosis Date   Hyperlipidemia    Hypertension    Pre-diabetes    A1c 6%    Past Surgical History:  Procedure Laterality Date   colonoscopy with polypectomy  2009   [facility]   TONSILLECTOMY AND ADENOIDECTOMY     WISDOM TOOTH EXTRACTION     two extraction    Current Medications: Current Outpatient Medications on File Prior to Visit  Medication Sig   amLODipine (NORVASC) 10 MG tablet Take 1 tablet by mouth once daily   Ascorbic Acid (VITAMIN C) 1000 MG tablet Take 1,000 mg by mouth daily.   aspirin 81 MG tablet Take 81 mg by mouth daily.   carvedilol (COREG) 25 MG tablet Take 1 tablet (25 mg total) by mouth 2 (two) times daily.   Cholecalciferol (VITAMIN D3) 1.25 MG (50000 UT) CAPS Take 1 capsule by mouth once a week   doxazosin (CARDURA) 2 MG tablet Take 1 tablet by mouth once daily   irbesartan (AVAPRO) 300 MG tablet Take 1 tablet (300 mg total) by mouth daily.   Multiple Vitamin (MULTIVITAMIN PO) Take by mouth.   omeprazole (PRILOSEC) 40 MG capsule Take 1 capsule by mouth once daily   rosuvastatin (CRESTOR) 10 MG tablet Take 1 tablet (10 mg total) by mouth daily.   spironolactone (  ALDACTONE) 25 MG tablet Take 1 tablet by mouth once daily   No current facility-administered medications on file prior to visit.     Allergies:   Metformin and related   Social History   Tobacco Use   Smoking status: Every Day    Packs/day: 0.25    Years: 41.00    Total pack years: 10.25    Types: Cigarettes    Last attempt to quit: 04/18/1991    Years since quitting: 30.9    Passive exposure: Never   Smokeless tobacco: Never   Tobacco comments:    smoked 1972-1993, up to 1/2 ppd. As of 02/24/15 5 cig/day  Vaping Use   Vaping Use: Some days  Substance Use Topics   Alcohol use: Not Currently    Comment:  1 mixed drink/week   Drug  use: No    Family History: family history includes Diabetes in his mother; Heart attack in his paternal grandfather; Heart attack (age of onset: 28) in his father. There is no history of Cancer or Stroke. Father died at 71 yo of second or third heart attack. Mother has diabetes, on insulin.  ROS:   Please see the history of present illness. (+) Arthralgias All other systems are reviewed and negative.    EKGs/Labs/Other Studies Reviewed:    The following studies were reviewed today:  Echo 05/20/2021: Sonographer Comments: Patient is morbidly obese and suboptimal parasternal  window. Image acquisition challenging due to patient body habitus.  IMPRESSIONS     1. Left ventricular ejection fraction, by estimation, is 55 to 60%. The  left ventricle has normal function. The left ventricle has no regional  wall motion abnormalities. There is mild concentric left ventricular  hypertrophy. Left ventricular diastolic  parameters are indeterminate. The average left ventricular global  longitudinal strain is -13.5 %. The global longitudinal strain is  abnormal.   2. Right ventricular systolic function is normal. The right ventricular  size is normal.   3. Left atrial size was mildly dilated.   4. The mitral valve is grossly normal. Trivial mitral valve  regurgitation. No evidence of mitral stenosis.   5. The aortic valve is grossly normal. There is mild calcification of the  aortic valve. Aortic valve regurgitation is not visualized. No aortic  stenosis is present.   6. Aortic dilatation noted. There is borderline dilatation of the  ascending aorta, measuring 38 mm.   Comparison(s): Changes from prior study are noted. EF 35%, severe LVH, AOV  calcifications mild-mod, GLS -15.9%.   Conclusion(s)/Recommendation(s): EF significantly improved (EF within  normal limits, strain mildly abnormal) compared to prior.   Echo 02/26/2019:  1. Left ventricular ejection fraction, by visual  estimation, is 35 to  40%. The left ventricle has mildly decreased function. There is severely  increased left ventricular hypertrophy.   2. Left ventricular diastolic parameters are indeterminate.   3. Global right ventricle has normal systolic function.The right  ventricular size is normal. No increase in right ventricular wall  thickness.   4. Left atrial size was normal.   5. Right atrial size was normal.   6. Mild to moderate aortic valve annular calcification.   7. The mitral valve is normal in structure. No evidence of mitral valve  regurgitation. No evidence of mitral stenosis.   8. The tricuspid valve is normal in structure. Tricuspid valve  regurgitation is not demonstrated.   9. The aortic valve is tricuspid. Aortic valve regurgitation is not  visualized.  10. There is Mild  thickening of the aortic valve.  11. There is Moderate calcification of the aortic valve.  12. The pulmonic valve was normal in structure. Pulmonic valve  regurgitation is not visualized.  13. The atrial septum is grossly normal.  14. The average left ventricular global longitudinal strain is -15.9 %.   Nuclear Stress Test 02/19/2019: Nuclear stress EF: 44%. Mild diffuse hypokinesis The left ventricular ejection fraction is mildly decreased (45-54%). There was no ST segment deviation noted during stress. Defect 1: There is a small defect of mild severity present in the apical inferior and apical lateral location. Fixed defect. This is an intermediate risk study based upon reduced EF. Consider echocardiogram for verification. There is no ischemia identified.  Carotid Duplex 05/16/2017: Final Interpretation:  Right Carotid: Velocities in the right ICA are consistent with a 1-39%  stenosis.   Left Carotid: Velocities in the left ICA are consistent with a 1-39%  stenosis.   Vertebrals:  Both vertebral arteries were patent with antegrade flow.  Subclavians: Normal flow hemodynamics were seen in bilateral  subclavian arteries.   EKG:  EKG is personally reviewed.   03/15/2022:  NSR at 68 bpm 09/09/2021: not ordered 05/11/2021: not ordered 02/08/2021: Normal sinus rhythm at 74 bpm. Incomplete RBBB, borderline LVH  Recent Labs: 07/11/2021: BUN 32; Creatinine, Ser 1.73; Hemoglobin 15.8; Platelets 246.0; Potassium 4.7; Sodium 135   Recent Lipid Panel    Component Value Date/Time   CHOL 119 07/11/2021 1346   CHOL 162 05/11/2021 1050   CHOL 185 01/15/2014 0836   TRIG 259.0 (H) 07/11/2021 1346   TRIG 327 (H) 01/15/2014 0836   TRIG 307 (HH) 02/20/2006 0839   HDL 25.30 (L) 07/11/2021 1346   HDL 24 (L) 05/11/2021 1050   HDL 27 (L) 01/15/2014 0836   CHOLHDL 5 07/11/2021 1346   VLDL 51.8 (H) 07/11/2021 1346   LDLCALC 53 05/11/2021 1050   LDLCALC 93 01/15/2014 0836   LDLDIRECT 59.0 07/11/2021 1346    Physical Exam:    VS:  BP 120/78 (BP Location: Right Arm, Patient Position: Sitting, Cuff Size: Large)   Pulse 68     Wt Readings from Last 3 Encounters:  09/09/21 225 lb (102.1 kg)  07/11/21 223 lb (101.2 kg)  05/11/21 243 lb 8 oz (110.5 kg)    GEN: Well nourished, well developed in no acute distress HEENT: Normal, moist mucous membranes NECK: No JVD CARDIAC: regular rhythm, normal S1 and S2, no rubs or gallops. No murmur. VASCULAR: Radial and DP pulses 2+ bilaterally. No carotid bruits RESPIRATORY:  Clear to auscultation without rales, wheezing or rhonchi  ABDOMEN: Soft, non-tender, non-distended MUSCULOSKELETAL:  Ambulates independently SKIN: Warm and dry, no edema NEUROLOGIC:  Alert and oriented x 3. No focal neuro deficits noted. PSYCHIATRIC:  Normal affect    ASSESSMENT:    1. Essential hypertension   2. NICM (nonischemic cardiomyopathy) ([place])   3. Hypertensive left ventricular hypertrophy, without heart failure   4. Stage 3b chronic kidney disease ([place])   5. Mixed hyperlipidemia   6. Tobacco abuse   7. Diabetes mellitus type 2 in obese [facility])     PLAN:     Cardiomyopathy, suspect nonischemic due to stress test, likely 2/2 hypertensive heart disease Severe LVH Chronic kidney disease, most recent stage 3b (GFR 40)  -NYHA class I -EF 35-40% in 02/2019-->normalized to 55-60% 05/2021 -had myoview at that time without ischemia, has never had cath -on carvedilol -on spironolactone, irbesartan -was on dapagliflozin, no longer taking -we discussed entresto, declined  due to cost  Hypertension: -on carvedilol, amlodipine, spironolactone, irbesartan, doxazosin -discussed checking home BP, contacting me if consistently >130/80  Mixed hyperlipidemia -continue rosuvastatin -no longer taking lovaza 2/2 cost  Type II diabetes, with proteinuria Obesity -on aspirin, rosuvastatin -no longer on dapagliflozin  Tobacco abuse: not ready to quit  Cardiac risk counseling and prevention recommendations: has family history of heart disease -recommend heart healthy/Mediterranean diet, with whole grains, fruits, vegetable, fish, lean meats, nuts, and olive oil. Limit salt. -recommend moderate walking, 3-5 times/week for 30-50 minutes each session. Aim for at least 150 minutes.week. Goal should be pace of 3 miles/hours, or walking 1.5 miles in 30 minutes -recommend avoidance of tobacco products. Avoid excess alcohol. -ASCVD risk score: The ASCVD Risk score (Arnett DK, et al., 2019) failed to calculate for the following reasons:   The valid total cholesterol range is 130 to 320 mg/dL    Plan for follow up: 6 months or sooner as needed.  Buford Dresser, MD, PhD, [facility]    Medication Adjustments/Labs and Tests Ordered: Current medicines are reviewed at length with the patient today.  Concerns regarding medicines are outlined above.   Orders Placed This Encounter  Procedures   EKG 12-Lead   No orders of the defined types were placed in this encounter.  Patient Instructions  Medication Instructions:  Your physician  recommends that you continue on your current medications as directed. Please refer to the Current Medication list given to you today.  *If you need a refill on your cardiac medications before your next appointment, please call your pharmacy*  Follow-Up: At [facility], you and your health needs are our priority.  As part of our continuing mission to provide you with exceptional heart care, we have created designated Provider Care Teams.  These Care Teams include your primary Cardiologist (physician) and Advanced Practice Providers (APPs -  Physician Assistants and Nurse Practitioners) who all work together to provide you with the care you need, when you need it.  We recommend signing up for the patient portal called "MyChart".  Sign up information is provided on this After Visit Summary.  MyChart is used to connect with patients for Virtual Visits (Telemedicine).  Patients are able to view lab/test results, encounter notes, upcoming appointments, etc.  Non-urgent messages can be sent to your provider as well.   To learn more about what you can do with MyChart, go to [email or web address].    Your next appointment:   6 month(s)  The format for your next appointment:   In Person  Provider:   Buford Dresser, MD        I,Mitra Faeizi,acting as a scribe for Buford Dresser, MD.,have documented all relevant documentation on the behalf of Buford Dresser, MD,as directed by  Buford Dresser, MD while in the presence of Buford Dresser, MD.   I, Buford Dresser, MD, have reviewed all documentation for this visit. The documentation on 03/15/22 for the exam, diagnosis, procedures, and orders are all accurate and complete.   Signed, Buford Dresser, MD PhD 03/15/2022     [place]

## 2022-03-15 NOTE — Patient Instructions (Signed)
Medication Instructions:  Your physician recommends that you continue on your current medications as directed. Please refer to the Current Medication list given to you today.  *If you need a refill on your cardiac medications before your next appointment, please call your pharmacy*  Follow-Up: At [facility], you and your health needs are our priority.  As part of our continuing mission to provide you with exceptional heart care, we have created designated Provider Care Teams.  These Care Teams include your primary Cardiologist (physician) and Advanced Practice Providers (APPs -  Physician Assistants and Nurse Practitioners) who all work together to provide you with the care you need, when you need it.  We recommend signing up for the patient portal called "MyChart".  Sign up information is provided on this After Visit Summary.  MyChart is used to connect with patients for Virtual Visits (Telemedicine).  Patients are able to view lab/test results, encounter notes, upcoming appointments, etc.  Non-urgent messages can be sent to your provider as well.   To learn more about what you can do with MyChart, go to [email or web address].    Your next appointment:   6 month(s)  The format for your next appointment:   In Person  Provider:   Bridgette [name], MD     

## 2022-04-30 ENCOUNTER — Other Ambulatory Visit: Payer: Self-pay | Admitting: Internal Medicine

## 2022-04-30 DIAGNOSIS — E559 Vitamin D deficiency, unspecified: Secondary | ICD-10-CM

## 2022-04-30 DIAGNOSIS — E876 Hypokalemia: Secondary | ICD-10-CM

## 2022-04-30 DIAGNOSIS — I119 Hypertensive heart disease without heart failure: Secondary | ICD-10-CM

## 2022-04-30 DIAGNOSIS — I10 Essential (primary) hypertension: Secondary | ICD-10-CM

## 2022-05-21 ENCOUNTER — Encounter (HOSPITAL_BASED_OUTPATIENT_CLINIC_OR_DEPARTMENT_OTHER): Payer: Self-pay | Admitting: Cardiology

## 2022-05-25 ENCOUNTER — Telehealth: Payer: Self-pay | Admitting: Internal Medicine

## 2022-05-25 NOTE — Telephone Encounter (Signed)
Pt has been scheduled for 2/13 @ 3.40pm for f/u

## 2022-05-25 NOTE — Telephone Encounter (Signed)
MEDICATION: rosuvastatin (CRESTOR) 10 MG tablet  [place], [place] - [address]   Comments:   **Let patient know to contact pharmacy at the end of the day to make sure medication is ready. **  ** Please notify patient to allow 48-72 hours to process**  **Encourage patient to contact the pharmacy for refills or they can request refills through [facility]**

## 2022-05-30 ENCOUNTER — Encounter: Payer: Self-pay | Admitting: Internal Medicine

## 2022-05-30 ENCOUNTER — Ambulatory Visit (INDEPENDENT_AMBULATORY_CARE_PROVIDER_SITE_OTHER): Payer: No Typology Code available for payment source | Admitting: Internal Medicine

## 2022-05-30 VITALS — BP 150/92 | HR 81 | Temp 98.3°F | Resp 16 | Ht 69.0 in | Wt 239.0 lb

## 2022-05-30 DIAGNOSIS — R195 Other fecal abnormalities: Secondary | ICD-10-CM

## 2022-05-30 DIAGNOSIS — E119 Type 2 diabetes mellitus without complications: Secondary | ICD-10-CM

## 2022-05-30 DIAGNOSIS — Z794 Long term (current) use of insulin: Secondary | ICD-10-CM

## 2022-05-30 DIAGNOSIS — N1831 Chronic kidney disease, stage 3a: Secondary | ICD-10-CM | POA: Diagnosis not present

## 2022-05-30 DIAGNOSIS — Z0000 Encounter for general adult medical examination without abnormal findings: Secondary | ICD-10-CM

## 2022-05-30 DIAGNOSIS — I119 Hypertensive heart disease without heart failure: Secondary | ICD-10-CM | POA: Diagnosis not present

## 2022-05-30 DIAGNOSIS — Z0001 Encounter for general adult medical examination with abnormal findings: Secondary | ICD-10-CM

## 2022-05-30 DIAGNOSIS — E669 Obesity, unspecified: Secondary | ICD-10-CM

## 2022-05-30 DIAGNOSIS — E781 Pure hyperglyceridemia: Secondary | ICD-10-CM | POA: Diagnosis not present

## 2022-05-30 DIAGNOSIS — N400 Enlarged prostate without lower urinary tract symptoms: Secondary | ICD-10-CM

## 2022-05-30 DIAGNOSIS — I10 Essential (primary) hypertension: Secondary | ICD-10-CM

## 2022-05-30 DIAGNOSIS — E559 Vitamin D deficiency, unspecified: Secondary | ICD-10-CM | POA: Diagnosis not present

## 2022-05-30 DIAGNOSIS — Z720 Tobacco use: Secondary | ICD-10-CM

## 2022-05-30 DIAGNOSIS — E785 Hyperlipidemia, unspecified: Secondary | ICD-10-CM

## 2022-05-30 DIAGNOSIS — E1169 Type 2 diabetes mellitus with other specified complication: Secondary | ICD-10-CM | POA: Diagnosis not present

## 2022-05-30 DIAGNOSIS — Z23 Encounter for immunization: Secondary | ICD-10-CM

## 2022-05-30 LAB — CBC WITH DIFFERENTIAL/PLATELET
Basophils Absolute: 0 10*3/uL (ref 0.0–0.1)
Basophils Relative: 0.7 % (ref 0.0–3.0)
Eosinophils Absolute: 0.2 10*3/uL (ref 0.0–0.7)
Eosinophils Relative: 2.7 % (ref 0.0–5.0)
HCT: 42.7 % (ref 39.0–52.0)
Hemoglobin: 14.5 g/dL (ref 13.0–17.0)
Lymphocytes Relative: 23 % (ref 12.0–46.0)
Lymphs Abs: 1.6 10*3/uL (ref 0.7–4.0)
MCHC: 34 g/dL (ref 30.0–36.0)
MCV: 92.6 fl (ref 78.0–100.0)
Monocytes Absolute: 0.6 10*3/uL (ref 0.1–1.0)
Monocytes Relative: 9.2 % (ref 3.0–12.0)
Neutro Abs: 4.6 10*3/uL (ref 1.4–7.7)
Neutrophils Relative %: 64.4 % (ref 43.0–77.0)
Platelets: 242 10*3/uL (ref 150.0–400.0)
RBC: 4.61 Mil/uL (ref 4.22–5.81)
RDW: 14.7 % (ref 11.5–15.5)
WBC: 7.1 10*3/uL (ref 4.0–10.5)

## 2022-05-30 LAB — HEPATIC FUNCTION PANEL
ALT: 38 U/L (ref 0–53)
AST: 26 U/L (ref 0–37)
Albumin: 4.3 g/dL (ref 3.5–5.2)
Alkaline Phosphatase: 45 U/L (ref 39–117)
Bilirubin, Direct: 0.1 mg/dL (ref 0.0–0.3)
Total Bilirubin: 0.7 mg/dL (ref 0.2–1.2)
Total Protein: 6.7 g/dL (ref 6.0–8.3)

## 2022-05-30 LAB — LIPID PANEL
Cholesterol: 223 mg/dL — ABNORMAL HIGH (ref 0–200)
HDL: 22.8 mg/dL — ABNORMAL LOW (ref >39.00)
Total CHOL/HDL Ratio: 10
Triglycerides: 1162 mg/dL — ABNORMAL HIGH (ref 0.0–149.0)

## 2022-05-30 LAB — BASIC METABOLIC PANEL
BUN: 19 mg/dL (ref 6–23)
CO2: 28 mEq/L (ref 19–32)
Calcium: 9.8 mg/dL (ref 8.4–10.5)
Chloride: 102 mEq/L (ref 96–112)
Creatinine, Ser: 1.55 mg/dL — ABNORMAL HIGH (ref 0.40–1.50)
GFR: 46.13 mL/min — ABNORMAL LOW (ref >60.00)
Glucose, Bld: 125 mg/dL — ABNORMAL HIGH (ref 70–99)
Potassium: 4.7 mEq/L (ref 3.5–5.1)
Sodium: 137 mEq/L (ref 135–145)

## 2022-05-30 LAB — MICROALBUMIN / CREATININE URINE RATIO
Creatinine,U: 162.2 mg/dL
Microalb Creat Ratio: 19.5 mg/g (ref 0.0–30.0)
Microalb, Ur: 31.6 mg/dL — ABNORMAL HIGH (ref 0.0–1.9)

## 2022-05-30 LAB — LDL CHOLESTEROL, DIRECT: Direct LDL: 37 mg/dL

## 2022-05-30 LAB — TSH: TSH: 2.43 u[IU]/mL (ref 0.35–5.50)

## 2022-05-30 LAB — HEMOGLOBIN A1C: Hgb A1c MFr Bld: 10 % — ABNORMAL HIGH (ref 4.6–6.5)

## 2022-05-30 LAB — VITAMIN D 25 HYDROXY (VIT D DEFICIENCY, FRACTURES): VITD: 70.84 ng/mL (ref 30.00–100.00)

## 2022-05-30 LAB — PSA: PSA: 1.28 ng/mL (ref 0.10–4.00)

## 2022-05-30 MED ORDER — SHINGRIX 50 MCG/0.5ML IM SUSR: 0.5000 mL | Freq: Once | INTRAMUSCULAR | 1 refills | Status: AC

## 2022-05-30 MED ORDER — AMLODIPINE BESYLATE 10 MG PO TABS: 10.0000 mg | ORAL_TABLET | Freq: Every day | ORAL | 0 refills | Status: DC

## 2022-05-30 MED ORDER — BOOSTRIX 5-2.5-18.5 LF-MCG/0.5 IM SUSP: 0.5000 mL | Freq: Once | INTRAMUSCULAR | 0 refills | Status: AC

## 2022-05-30 NOTE — Patient Instructions (Signed)
Health Maintenance, Male Adopting a healthy lifestyle and getting preventive care are important in promoting health and wellness. Ask your health care provider about: The right schedule for you to have regular tests and exams. Things you can do on your own to prevent diseases and keep yourself healthy. What should I know about diet, weight, and exercise? Eat a healthy diet  Eat a diet that includes plenty of vegetables, fruits, low-fat dairy products, and lean protein. Do not eat a lot of foods that are high in solid fats, added sugars, or sodium. Maintain a healthy weight Body mass index (BMI) is a measurement that can be used to identify possible weight problems. It estimates body fat based on height and weight. Your health care provider can help determine your BMI and help you achieve or maintain a healthy weight. Get regular exercise Get regular exercise. This is one of the most important things you can do for your health. Most adults should: Exercise for at least 150 minutes each week. The exercise should increase your heart rate and make you sweat (moderate-intensity exercise). Do strengthening exercises at least twice a week. This is in addition to the moderate-intensity exercise. Spend less time sitting. Even light physical activity can be beneficial. Watch cholesterol and blood lipids Have your blood tested for lipids and cholesterol at 68 years of age, then have this test every 5 years. You may need to have your cholesterol levels checked more often if: Your lipid or cholesterol levels are high. You are older than 68 years of age. You are at high risk for heart disease. What should I know about cancer screening? Many types of cancers can be detected early and may often be prevented. Depending on your health history and family history, you may need to have cancer screening at various ages. This may include screening for: Colorectal cancer. Prostate cancer. Skin cancer. Lung  cancer. What should I know about heart disease, diabetes, and high blood pressure? Blood pressure and heart disease High blood pressure causes heart disease and increases the risk of stroke. This is more likely to develop in people who have high blood pressure readings or are overweight. Talk with your health care provider about your target blood pressure readings. Have your blood pressure checked: Every 3-5 years if you are 18-39 years of age. Every year if you are 40 years old or older. If you are between the ages of 65 and 75 and are a current or former smoker, ask your health care provider if you should have a one-time screening for abdominal aortic aneurysm (AAA). Diabetes Have regular diabetes screenings. This checks your fasting blood sugar level. Have the screening done: Once every three years after age 45 if you are at a normal weight and have a low risk for diabetes. More often and at a younger age if you are overweight or have a high risk for diabetes. What should I know about preventing infection? Hepatitis B If you have a higher risk for hepatitis B, you should be screened for this virus. Talk with your health care provider to find out if you are at risk for hepatitis B infection. Hepatitis C Blood testing is recommended for: Everyone born from 1945 through 1965. Anyone with known risk factors for hepatitis C. Sexually transmitted infections (STIs) You should be screened each year for STIs, including gonorrhea and chlamydia, if: You are sexually active and are younger than 68 years of age. You are older than 68 years of age and your   health care provider tells you that you are at risk for this type of infection. Your sexual activity has changed since you were last screened, and you are at increased risk for chlamydia or gonorrhea. Ask your health care provider if you are at risk. Ask your health care provider about whether you are at high risk for HIV. Your health care provider  may recommend a prescription medicine to help prevent HIV infection. If you choose to take medicine to prevent HIV, you should first get tested for HIV. You should then be tested every 3 months for as long as you are taking the medicine. Follow these instructions at home: Alcohol use Do not drink alcohol if your health care provider tells you not to drink. If you drink alcohol: Limit how much you have to 0-2 drinks a day. Know how much alcohol is in your drink. In the [place] one drink equals one 12 oz bottle of beer (355 mL), one 5 oz glass of wine (148 mL), or one 1 oz glass of hard liquor (44 mL). Lifestyle Do not use any products that contain nicotine or tobacco. These products include cigarettes, chewing tobacco, and vaping devices, such as e-cigarettes. If you need help quitting, ask your health care provider. Do not use street drugs. Do not share needles. Ask your health care provider for help if you need support or information about quitting drugs. General instructions Schedule regular health, dental, and eye exams. Stay current with your vaccines. Tell your health care provider if: You often feel depressed. You have ever been abused or do not feel safe at home. Summary Adopting a healthy lifestyle and getting preventive care are important in promoting health and wellness. Follow your health care provider's instructions about healthy diet, exercising, and getting tested or screened for diseases. Follow your health care provider's instructions on monitoring your cholesterol and blood pressure. This information is not intended to replace advice given to you by your health care provider. Make sure you discuss any questions you have with your health care provider. Document Revised: 08/23/2020 Document Reviewed: 08/23/2020 Elsevier Patient Education  2023 [employer]  

## 2022-05-30 NOTE — Progress Notes (Unsigned)
Subjective:  Patient ID: Tim Decker, male    DOB: Nov 06, 1955  Age: 67 y.o. MRN: [id number]  CC: Annual Exam, Hypertension, Hyperlipidemia, and Diabetes   HPI Tim Decker presents for a CPX and f/up -   Outpatient Medications Prior to Visit  Medication Sig Dispense Refill   Ascorbic Acid (VITAMIN C) 1000 MG tablet Take 1,000 mg by mouth daily.     aspirin 81 MG tablet Take 81 mg by mouth daily.     Cholecalciferol (VITAMIN D3) 1.25 MG (50000 UT) CAPS Take 1 capsule by mouth once a week 12 capsule 0   doxazosin (CARDURA) 2 MG tablet Take 1 tablet by mouth once daily 90 tablet 0   irbesartan (AVAPRO) 300 MG tablet Take 1 tablet (300 mg total) by mouth daily. 90 tablet 3   Multiple Vitamin (MULTIVITAMIN PO) Take by mouth.     omeprazole (PRILOSEC) 40 MG capsule Take 1 capsule by mouth once daily 90 capsule 0   rosuvastatin (CRESTOR) 10 MG tablet Take 1 tablet (10 mg total) by mouth daily. 90 tablet 0   spironolactone (ALDACTONE) 25 MG tablet Take 1 tablet by mouth once daily 90 tablet 0   amLODipine (NORVASC) 10 MG tablet Take 1 tablet by mouth once daily 90 tablet 0   carvedilol (COREG) 25 MG tablet Take 1 tablet (25 mg total) by mouth 2 (two) times daily. 180 tablet 0   No facility-administered medications prior to visit.    ROS Review of Systems  Objective:  BP (!) 150/92 (BP Location: Left Arm, Patient Position: Sitting, Cuff Size: Large)   Pulse 81   Temp 98.3 F (36.8 C) (Oral)   Resp 16   Ht 5' 9"$  (1.753 m)   Wt 239 lb (108.4 kg)   SpO2 94%   BMI 35.29 kg/m   BP Readings from Last 3 Encounters:  05/30/22 (!) 150/92  03/15/22 120/78  09/09/21 134/76    Wt Readings from Last 3 Encounters:  05/30/22 239 lb (108.4 kg)  09/09/21 225 lb (102.1 kg)  07/11/21 223 lb (101.2 kg)    Physical Exam Abdominal:     General: Abdomen is protuberant. Bowel sounds are normal. There is no distension.     Palpations: Abdomen is soft. There is no hepatomegaly,  splenomegaly or mass.     Tenderness: There is no abdominal tenderness. There is no guarding or rebound.     Hernia: No hernia is present. There is no hernia in the left inguinal area or right inguinal area.  Genitourinary:    Pubic Area: No rash.      Penis: Normal and circumcised.      Testes: Normal.     Epididymis:     Right: Normal.     Left: Normal.     Prostate: Enlarged. Not tender and no nodules present.     Rectum: Normal. Guaiac result negative. No mass, tenderness, anal fissure, external hemorrhoid or internal hemorrhoid. Normal anal tone.  Lymphadenopathy:     Lower Body: No right inguinal adenopathy. No left inguinal adenopathy.     Lab Results  Component Value Date   WBC 6.5 07/11/2021   HGB 15.8 07/11/2021   HCT 47.2 07/11/2021   PLT 246.0 07/11/2021   GLUCOSE 101 (H) 07/11/2021   CHOL 119 07/11/2021   TRIG 259.0 (H) 07/11/2021   HDL 25.30 (L) 07/11/2021   LDLDIRECT 59.0 07/11/2021   LDLCALC 53 05/11/2021   ALT 36 11/24/2020   AST 25 11/24/2020  NA 135 07/11/2021   K 4.7 07/11/2021   CL 102 07/11/2021   CREATININE 1.73 (H) 07/11/2021   BUN 32 (H) 07/11/2021   CO2 25 07/11/2021   TSH 2.21 11/24/2020   PSA 1.14 11/24/2020   HGBA1C 6.4 07/11/2021   MICROALBUR 37.0 (H) 11/24/2020    DG Chest 2 View  Result Date: 02/10/2019 CLINICAL DATA:  Hypertension. EXAM: CHEST - 2 VIEW COMPARISON:  None. FINDINGS: The heart size and mediastinal contours are within normal limits. Both lungs are clear. The visualized skeletal structures are unremarkable. IMPRESSION: No active cardiopulmonary disease. Electronically Signed   By: Marijo Conception M.D.   On: 02/10/2019 09:31    Assessment & Plan:   Tim Decker was seen today for annual exam, hypertension, hyperlipidemia and diabetes.  Diagnoses and all orders for this visit:  Essential hypertension -     CBC with Differential/Platelet; Future -     Basic metabolic panel; Future -     Urinalysis, Routine w reflex  microscopic; Future -     TSH; Future -     Hepatic function panel; Future -     amLODipine (NORVASC) 10 MG tablet; Take 1 tablet (10 mg total) by mouth daily.  Diabetes mellitus type 2 in obese [facility]) -     Hepatic function panel; Future -     Hemoglobin A1c; Future -     Microalbumin / creatinine urine ratio; Future -     HM Diabetes Foot Exam  Stage 3a chronic kidney disease [place] -     Basic metabolic panel; Future -     Urinalysis, Routine w reflex microscopic; Future -     Microalbumin / creatinine urine ratio; Future  Pure hyperglyceridemia -     Lipid panel; Future  Positive colorectal cancer screening using Cologuard test -     Ambulatory referral to Gastroenterology  Encounter for general adult medical examination with abnormal findings  BPH without obstruction/lower urinary tract symptoms -     PSA; Future -     Urinalysis, Routine w reflex microscopic; Future  Hyperlipidemia LDL goal <130 -     TSH; Future -     Hepatic function panel; Future -     Lipid panel; Future  Hypertensive left ventricular hypertrophy, without heart failure -     amLODipine (NORVASC) 10 MG tablet; Take 1 tablet (10 mg total) by mouth daily.  Vitamin D deficiency disease -     VITAMIN D 25 Hydroxy (Vit-D Deficiency, Fractures); Future  Need for prophylactic vaccination with combined diphtheria-tetanus-pertussis (DTP) vaccine -     Tdap (BOOSTRIX) 5-2.5-18.5 LF-MCG/0.5 injection; Inject 0.5 mLs into the muscle once for 1 dose.  Need for varicella vaccine -     Zoster Vaccine Adjuvanted [facility]) injection; Inject 0.5 mLs into the muscle once for 1 dose.  Tobacco abuse -     Ambulatory Referral for Lung Cancer Scre   I have changed Tim Decker's amLODipine. I am also having him start on Boostrix and Shingrix. Additionally, I am having him maintain his aspirin, vitamin C, Multiple Vitamin (MULTIVITAMIN PO), carvedilol, irbesartan, rosuvastatin, spironolactone, doxazosin, Vitamin  D3, and omeprazole.  Meds ordered this encounter  Medications   amLODipine (NORVASC) 10 MG tablet    Sig: Take 1 tablet (10 mg total) by mouth daily.    Dispense:  90 tablet    Refill:  0   Tdap (BOOSTRIX) 5-2.5-18.5 LF-MCG/0.5 injection    Sig: Inject 0.5 mLs into the muscle once  for 1 dose.    Dispense:  0.5 mL    Refill:  0   Zoster Vaccine Adjuvanted [facility]) injection    Sig: Inject 0.5 mLs into the muscle once for 1 dose.    Dispense:  0.5 mL    Refill:  1     Follow-up: No follow-ups on file.  Scarlette Calico, MD

## 2022-05-31 ENCOUNTER — Encounter: Payer: Self-pay | Admitting: *Deleted

## 2022-05-31 DIAGNOSIS — E559 Vitamin D deficiency, unspecified: Secondary | ICD-10-CM | POA: Insufficient documentation

## 2022-05-31 DIAGNOSIS — E781 Pure hyperglyceridemia: Secondary | ICD-10-CM | POA: Insufficient documentation

## 2022-05-31 DIAGNOSIS — Z23 Encounter for immunization: Secondary | ICD-10-CM | POA: Insufficient documentation

## 2022-05-31 DIAGNOSIS — E119 Type 2 diabetes mellitus without complications: Secondary | ICD-10-CM | POA: Insufficient documentation

## 2022-05-31 DIAGNOSIS — E785 Hyperlipidemia, unspecified: Secondary | ICD-10-CM | POA: Insufficient documentation

## 2022-05-31 LAB — URINALYSIS, ROUTINE W REFLEX MICROSCOPIC
Bilirubin Urine: NEGATIVE
Hgb urine dipstick: NEGATIVE
Ketones, ur: NEGATIVE
Leukocytes,Ua: NEGATIVE
Nitrite: NEGATIVE
RBC / HPF: NONE SEEN (ref 0–?)
Specific Gravity, Urine: 1.025 (ref 1.000–1.030)
Total Protein, Urine: 30 — AB
Urine Glucose: NEGATIVE
Urobilinogen, UA: 0.2 (ref 0.0–1.0)
pH: 6 (ref 5.0–8.0)

## 2022-05-31 MED ORDER — INSULIN PEN NEEDLE 32G X 6 MM MISC: 1.0000 | Freq: Every day | 1 refills | Status: DC

## 2022-05-31 MED ORDER — BOOSTRIX 5-2.5-18.5 LF-MCG/0.5 IM SUSP: 0.5000 mL | Freq: Once | INTRAMUSCULAR | 0 refills | Status: AC

## 2022-05-31 MED ORDER — DEXCOM G7 RECEIVER DEVI: 1.0000 | Freq: Every day | 1 refills | Status: DC

## 2022-05-31 MED ORDER — TIRZEPATIDE 2.5 MG/0.5ML ~~LOC~~ SOAJ: 2.5000 mg | SUBCUTANEOUS | 0 refills | Status: DC

## 2022-05-31 MED ORDER — DEXCOM G7 SENSOR MISC: 1.0000 | Freq: Every day | 1 refills | Status: DC

## 2022-05-31 MED ORDER — TOUJEO MAX SOLOSTAR 300 UNIT/ML ~~LOC~~ SOPN: 30.0000 [IU] | PEN_INJECTOR | Freq: Every day | SUBCUTANEOUS | 0 refills | Status: DC

## 2022-05-31 MED ORDER — OMEGA-3-ACID ETHYL ESTERS 1 G PO CAPS: 2.0000 g | ORAL_CAPSULE | Freq: Two times a day (BID) | ORAL | 1 refills | Status: DC

## 2022-05-31 MED ORDER — SHINGRIX 50 MCG/0.5ML IM SUSR: 0.5000 mL | Freq: Once | INTRAMUSCULAR | 1 refills | Status: AC

## 2022-06-02 ENCOUNTER — Other Ambulatory Visit: Payer: Self-pay | Admitting: Internal Medicine

## 2022-06-02 DIAGNOSIS — I429 Cardiomyopathy, unspecified: Secondary | ICD-10-CM

## 2022-06-02 DIAGNOSIS — N400 Enlarged prostate without lower urinary tract symptoms: Secondary | ICD-10-CM

## 2022-06-02 DIAGNOSIS — E559 Vitamin D deficiency, unspecified: Secondary | ICD-10-CM

## 2022-06-02 DIAGNOSIS — I119 Hypertensive heart disease without heart failure: Secondary | ICD-10-CM

## 2022-06-02 DIAGNOSIS — K219 Gastro-esophageal reflux disease without esophagitis: Secondary | ICD-10-CM

## 2022-06-02 DIAGNOSIS — I10 Essential (primary) hypertension: Secondary | ICD-10-CM

## 2022-06-02 DIAGNOSIS — E876 Hypokalemia: Secondary | ICD-10-CM

## 2022-06-02 DIAGNOSIS — E785 Hyperlipidemia, unspecified: Secondary | ICD-10-CM

## 2022-06-02 MED ORDER — ROSUVASTATIN CALCIUM 10 MG PO TABS: 10.0000 mg | ORAL_TABLET | Freq: Every day | ORAL | 0 refills | Status: DC

## 2022-06-02 MED ORDER — CARVEDILOL 25 MG PO TABS: 25.0000 mg | ORAL_TABLET | Freq: Two times a day (BID) | ORAL | 0 refills | Status: DC

## 2022-06-02 MED ORDER — DOXAZOSIN MESYLATE 2 MG PO TABS: 2.0000 mg | ORAL_TABLET | Freq: Every day | ORAL | 0 refills | Status: DC

## 2022-06-02 MED ORDER — OMEPRAZOLE 40 MG PO CPDR: 40.0000 mg | DELAYED_RELEASE_CAPSULE | Freq: Every day | ORAL | 0 refills | Status: DC

## 2022-06-02 MED ORDER — SPIRONOLACTONE 25 MG PO TABS: 25.0000 mg | ORAL_TABLET | Freq: Every day | ORAL | 0 refills | Status: DC

## 2022-06-19 DIAGNOSIS — E785 Hyperlipidemia, unspecified: Secondary | ICD-10-CM | POA: Diagnosis not present

## 2022-06-19 DIAGNOSIS — E1165 Type 2 diabetes mellitus with hyperglycemia: Secondary | ICD-10-CM | POA: Diagnosis not present

## 2022-07-03 ENCOUNTER — Encounter: Payer: Self-pay | Admitting: Gastroenterology

## 2022-07-03 ENCOUNTER — Ambulatory Visit (INDEPENDENT_AMBULATORY_CARE_PROVIDER_SITE_OTHER): Payer: No Typology Code available for payment source | Admitting: Gastroenterology

## 2022-07-03 VITALS — BP 121/73 | HR 72 | Temp 98.1°F | Ht 69.0 in | Wt 237.0 lb

## 2022-07-03 DIAGNOSIS — R195 Other fecal abnormalities: Secondary | ICD-10-CM

## 2022-07-03 NOTE — Progress Notes (Signed)
GI Office Note    Referring Provider: Janith Lima, MD Primary Care Physician:  Janith Lima, MD  Primary Gastroenterologist: Cristopher Estimable.Rourk, MD   Chief Complaint   Chief Complaint  Patient presents with   New Patient (Initial Visit)    Pt had a positive cologuard   History of Present Illness   Tim Decker is a 67 y.o. male presenting today at the request of Janith Lima, MD for positive Cologuard.   Last colonoscopy performed by Dr. Carlean Purl October 2009: -9 mm sessile cecal polyp -5 mm sigmoid sessile polyp -Normal rectum -Pathology revealed hyperplastic polyps  Today: Positive Cologuard over a year ago. - per review of chart it was April 2020.   Has regular BMs. Typically goes to have a BM after every meal but is baseline for him. Reports his mother lost weight and has diabetes as well. BP well controlled. Reports it has always ran high but currently controlled.   Has been working on trying to lose weight and going to the gym. High cost with sensors. Pricks his finger daily and sugars have been less than 200.   No upper or lower GI symptoms.   No reflux - controlled on omeprazole.   Denies any chest pain, shortness of breath, constipation, diarrhea, change in bowel habits, lack of appetite, unintentional weight loss, early satiety, abdominal pain, reflux, dysphagia, nausea, vomiting.  Medications: Mounjaro - once weekly (started seeing someone in [place]). No side effects. Has not changed appetite.  Toujeo - daily - 30 units.   Retired from [employer]. Currently runs an antique store.   Current Outpatient Medications  Medication Sig Dispense Refill   amLODipine (NORVASC) 10 MG tablet Take 1 tablet (10 mg total) by mouth daily. 90 tablet 0   aspirin 81 MG tablet Take 81 mg by mouth daily.     carvedilol (COREG) 25 MG tablet Take 1 tablet (25 mg total) by mouth 2 (two) times daily. 180 tablet 0   doxazosin (CARDURA) 2 MG tablet Take 1 tablet  (2 mg total) by mouth daily. 90 tablet 0   insulin glargine, 2 Unit Dial, (TOUJEO MAX SOLOSTAR) 300 UNIT/ML Solostar Pen Inject 30 Units into the skin daily. 9 mL 0   Insulin Pen Needle 32G X 6 MM MISC 1 Act by Does not apply route daily. 100 each 1   irbesartan (AVAPRO) 300 MG tablet Take 1 tablet (300 mg total) by mouth daily. 90 tablet 3   Multiple Vitamin (MULTIVITAMIN PO) Take by mouth.     omega-3 acid ethyl esters (LOVAZA) 1 g capsule Take 2 capsules (2 g total) by mouth 2 (two) times daily. 360 capsule 1   omeprazole (PRILOSEC) 40 MG capsule Take 1 capsule (40 mg total) by mouth daily. 90 capsule 0   rosuvastatin (CRESTOR) 10 MG tablet Take 1 tablet (10 mg total) by mouth daily. 90 tablet 0   spironolactone (ALDACTONE) 25 MG tablet Take 1 tablet (25 mg total) by mouth daily. 90 tablet 0   tirzepatide (MOUNJARO) 2.5 MG/0.5ML Pen Inject 2.5 mg into the skin once a week. 2 mL 0   Continuous Blood Gluc Receiver (DEXCOM G7 RECEIVER) DEVI 1 Act by Does not apply route daily. (Patient not taking: Reported on 07/03/2022) 9 each 1   Continuous Blood Gluc Sensor (DEXCOM G7 SENSOR) MISC 1 Act by Does not apply route daily. (Patient not taking: Reported on 07/03/2022) 9 each 1   No current facility-administered medications for  this visit.    Past Medical History:  Diagnosis Date   Hyperlipidemia    Hypertension    Pre-diabetes    A1c 6%    Past Surgical History:  Procedure Laterality Date   colonoscopy with polypectomy  2009   Dr [facility]   TONSILLECTOMY AND ADENOIDECTOMY     WISDOM TOOTH EXTRACTION     two extraction    Family History  Problem Relation Age of Onset   Heart attack Father 22   Diabetes Mother    Heart attack Paternal Grandfather        ? 35   Cancer Neg Hx    Stroke Neg Hx     Allergies as of 07/03/2022 - Review Complete 07/03/2022  Allergen Reaction Noted   Metformin and related Diarrhea 09/25/2019    Social History   Socioeconomic History    Marital status: Divorced    Spouse name: Not on file   Number of children: Not on file   Years of education: Not on file   Highest education level: Not on file  Occupational History   Not on file  Tobacco Use   Smoking status: Every Day    Packs/day: 0.25    Years: 41.00    Additional pack years: 0.00    Total pack years: 10.25    Types: Cigarettes    Last attempt to quit: 04/18/1991    Years since quitting: 31.2    Passive exposure: Never   Smokeless tobacco: Never   Tobacco comments:    smoked 1972-1993, up to 1/2 ppd. As of 02/24/15 5 cig/day  Vaping Use   Vaping Use: Some days  Substance and Sexual Activity   Alcohol use: Not Currently    Comment:  1 mixed drink/week   Drug use: No   Sexual activity: Yes    Partners: Female  Other Topics Concern   Not on file  Social History Narrative   Not on file   Social Determinants of Health   Financial Resource Strain: Low Risk  (11/04/2021)   Overall Financial Resource Strain (CARDIA)    Difficulty of Paying Living Expenses: Not hard at all  Food Insecurity: No Food Insecurity (11/04/2021)   Hunger Vital Sign    Worried About Running Out of Food in the Last Year: Never true    Ran Out of Food in the Last Year: Never true  Transportation Needs: No Transportation Needs (11/04/2021)   PRAPARE - Hydrologist (Medical): No    Lack of Transportation (Non-Medical): No  Physical Activity: Sufficiently Active (11/04/2021)   Exercise Vital Sign    Days of Exercise per Week: 5 days    Minutes of Exercise per Session: 30 min  Stress: No Stress Concern Present (11/04/2021)   [place]    Feeling of Stress : Not at all  Social Connections: [place] (11/04/2021)   Social Connection and Isolation Panel [NHANES]    Frequency of Communication with Friends and Family: Three times a week    Frequency of Social Gatherings with Friends and  Family: Three times a week    Attends Religious Services: 1 to 4 times per year    Active Member of Clubs or Organizations: Yes    Attends Archivist Meetings: 1 to 4 times per year    Marital Status: Living with partner  Intimate Partner Violence: Not At Risk (11/04/2021)   Humiliation, Afraid, Rape, and Kick  questionnaire    Fear of Current or Ex-Partner: No    Emotionally Abused: No    Physically Abused: No    Sexually Abused: No     Review of Systems   Gen: Denies any fever, chills, fatigue, weight loss, lack of appetite.  CV: Denies chest pain, heart palpitations, peripheral edema, syncope.  Resp: Denies shortness of breath at rest or with exertion. Denies wheezing or cough.  GI: see HPI GU : Denies urinary burning, urinary frequency, urinary hesitancy MS: Denies joint pain, muscle weakness, cramps, or limitation of movement.  Derm: Denies rash, itching, dry skin Psych: Denies depression, anxiety, memory loss, and confusion Heme: Denies bruising, bleeding, and enlarged lymph nodes.   Physical Exam   BP 121/73   Pulse 72   Temp 98.1 F (36.7 C)   Ht 5\' 9"  (1.753 m)   Wt 237 lb (107.5 kg)   BMI 35.00 kg/m   General:   Alert and oriented. Pleasant and cooperative. Well-nourished and well-developed.  Head:  Normocephalic and atraumatic. Eyes:  Without icterus, sclera clear and conjunctiva pink.  Ears:  Normal auditory acuity. Mouth:  No deformity or lesions, oral mucosa pink.  Lungs:  Clear to auscultation bilaterally. No wheezes, rales, or rhonchi. No distress.  Heart:  S1, S2 present without murmurs appreciated.  Abdomen:  +BS, soft, non-tender and non-distended. No HSM noted. No guarding or rebound. No masses appreciated.  Rectal:  Deferred  Msk:  Symmetrical without gross deformities. Normal posture. Extremities:  Without edema. Neurologic:  Alert and  oriented x4;  grossly normal neurologically. Skin:  Intact without significant lesions or  rashes. Psych:  Alert and cooperative. Normal mood and affect.   Assessment   Tim Decker is a 67 y.o. male with a history of HLD, HTN, diabetes (last A1c 10), CKD stage IIIa, BPH presenting today for evaluation prior to scheduling colonoscopy due to positive Cologuard.  Positive Cologuard: Last colonoscopy in October 2009 with 2 hyperplastic polyps, otherwise normal.  Has regular bowel movements.  Denies any alarm symptoms.  No upper or lower GI symptoms.   PLAN   Proceed with colonoscopy with propofol by Dr. Abbey Chatters in near future: the risks, benefits, and alternatives have been discussed with the patient in detail. The patient states understanding and desires to proceed. ASA 3 PEG-based prep (TriLyte or GoLytely) Hold Mounjaro for 1 week Toujeo - hold morning of procedure   Venetia Night, MSN, FNP-BC, AGACNP-BC [facility]

## 2022-07-03 NOTE — Patient Instructions (Addendum)
We are scheduling you for a colonoscopy in the near future with Dr. Gala Romney. You will receive separate detailed written instructions regarding your prep.  In summary you will need to hold your [facility] for 1 week.  You may have your procedure on day 8 or after.  For your Toujeo he should not take this medication the morning of your procedure.  It was a pleasure to see you today. I want to create trusting relationships with patients. If you receive a survey regarding your visit,  I greatly appreciate you taking time to fill this out on paper or through your MyChart. I value your feedback.  Venetia Night, MSN, FNP-BC, AGACNP-BC [facility]

## 2022-07-04 ENCOUNTER — Other Ambulatory Visit: Payer: Self-pay | Admitting: *Deleted

## 2022-07-04 ENCOUNTER — Encounter: Payer: Self-pay | Admitting: *Deleted

## 2022-07-04 MED ORDER — PEG 3350-KCL-NA BICARB-NACL 420 G PO SOLR: 4000.0000 mL | Freq: Once | ORAL | 0 refills | Status: AC

## 2022-07-05 ENCOUNTER — Encounter: Payer: Self-pay | Admitting: Gastroenterology

## 2022-08-02 ENCOUNTER — Encounter (HOSPITAL_COMMUNITY)
Admission: RE | Admit: 2022-08-02 | Discharge: 2022-08-02 | Disposition: A | Discharge status: Home / Self Care | Payer: No Typology Code available for payment source | Source: Ambulatory Visit | Attending: Internal Medicine | Admitting: Internal Medicine

## 2022-08-02 ENCOUNTER — Other Ambulatory Visit: Payer: Self-pay | Admitting: Internal Medicine

## 2022-08-02 ENCOUNTER — Encounter (HOSPITAL_COMMUNITY): Payer: Self-pay

## 2022-08-02 ENCOUNTER — Other Ambulatory Visit: Payer: Self-pay

## 2022-08-02 DIAGNOSIS — E119 Type 2 diabetes mellitus without complications: Secondary | ICD-10-CM

## 2022-08-02 HISTORY — DX: Gastro-esophageal reflux disease without esophagitis: K21.9

## 2022-08-03 DIAGNOSIS — N1831 Chronic kidney disease, stage 3a: Secondary | ICD-10-CM | POA: Diagnosis not present

## 2022-08-03 DIAGNOSIS — K219 Gastro-esophageal reflux disease without esophagitis: Secondary | ICD-10-CM | POA: Diagnosis not present

## 2022-08-03 DIAGNOSIS — E1169 Type 2 diabetes mellitus with other specified complication: Secondary | ICD-10-CM | POA: Diagnosis not present

## 2022-08-03 DIAGNOSIS — I77810 Thoracic aortic ectasia: Secondary | ICD-10-CM | POA: Diagnosis not present

## 2022-08-03 DIAGNOSIS — Z008 Encounter for other general examination: Secondary | ICD-10-CM | POA: Diagnosis not present

## 2022-08-03 DIAGNOSIS — Z794 Long term (current) use of insulin: Secondary | ICD-10-CM | POA: Diagnosis not present

## 2022-08-03 DIAGNOSIS — I509 Heart failure, unspecified: Secondary | ICD-10-CM | POA: Diagnosis not present

## 2022-08-03 DIAGNOSIS — E261 Secondary hyperaldosteronism: Secondary | ICD-10-CM | POA: Diagnosis not present

## 2022-08-03 DIAGNOSIS — D8481 Immunodeficiency due to conditions classified elsewhere: Secondary | ICD-10-CM | POA: Diagnosis not present

## 2022-08-03 DIAGNOSIS — Z6833 Body mass index (BMI) 33.0-33.9, adult: Secondary | ICD-10-CM | POA: Diagnosis not present

## 2022-08-03 DIAGNOSIS — E785 Hyperlipidemia, unspecified: Secondary | ICD-10-CM | POA: Diagnosis not present

## 2022-08-03 DIAGNOSIS — E1122 Type 2 diabetes mellitus with diabetic chronic kidney disease: Secondary | ICD-10-CM | POA: Diagnosis not present

## 2022-08-03 DIAGNOSIS — I130 Hypertensive heart and chronic kidney disease with heart failure and stage 1 through stage 4 chronic kidney disease, or unspecified chronic kidney disease: Secondary | ICD-10-CM | POA: Diagnosis not present

## 2022-08-03 DIAGNOSIS — E669 Obesity, unspecified: Secondary | ICD-10-CM | POA: Diagnosis not present

## 2022-08-07 ENCOUNTER — Ambulatory Visit (HOSPITAL_BASED_OUTPATIENT_CLINIC_OR_DEPARTMENT_OTHER): Payer: No Typology Code available for payment source | Admitting: Anesthesiology

## 2022-08-07 ENCOUNTER — Encounter (HOSPITAL_COMMUNITY)
Admission: RE | Disposition: A | Discharge status: Home / Self Care | Payer: Self-pay | Source: Home / Self Care | Attending: Internal Medicine

## 2022-08-07 ENCOUNTER — Encounter (HOSPITAL_COMMUNITY): Payer: Self-pay

## 2022-08-07 ENCOUNTER — Other Ambulatory Visit: Payer: Self-pay

## 2022-08-07 ENCOUNTER — Ambulatory Visit (HOSPITAL_COMMUNITY)
Admission: RE | Admit: 2022-08-07 | Discharge: 2022-08-07 | Disposition: A | Discharge status: Home / Self Care | Payer: No Typology Code available for payment source | Attending: Internal Medicine | Admitting: Internal Medicine

## 2022-08-07 ENCOUNTER — Ambulatory Visit (HOSPITAL_COMMUNITY): Payer: No Typology Code available for payment source | Admitting: Anesthesiology

## 2022-08-07 DIAGNOSIS — E119 Type 2 diabetes mellitus without complications: Secondary | ICD-10-CM | POA: Diagnosis not present

## 2022-08-07 DIAGNOSIS — D124 Benign neoplasm of descending colon: Secondary | ICD-10-CM | POA: Insufficient documentation

## 2022-08-07 DIAGNOSIS — K648 Other hemorrhoids: Secondary | ICD-10-CM | POA: Insufficient documentation

## 2022-08-07 DIAGNOSIS — D123 Benign neoplasm of transverse colon: Secondary | ICD-10-CM | POA: Diagnosis not present

## 2022-08-07 DIAGNOSIS — Z79899 Other long term (current) drug therapy: Secondary | ICD-10-CM | POA: Insufficient documentation

## 2022-08-07 DIAGNOSIS — Z7985 Long-term (current) use of injectable non-insulin antidiabetic drugs: Secondary | ICD-10-CM | POA: Insufficient documentation

## 2022-08-07 DIAGNOSIS — Z794 Long term (current) use of insulin: Secondary | ICD-10-CM | POA: Diagnosis not present

## 2022-08-07 DIAGNOSIS — F17210 Nicotine dependence, cigarettes, uncomplicated: Secondary | ICD-10-CM | POA: Diagnosis not present

## 2022-08-07 DIAGNOSIS — D122 Benign neoplasm of ascending colon: Secondary | ICD-10-CM | POA: Insufficient documentation

## 2022-08-07 DIAGNOSIS — I10 Essential (primary) hypertension: Secondary | ICD-10-CM

## 2022-08-07 DIAGNOSIS — K219 Gastro-esophageal reflux disease without esophagitis: Secondary | ICD-10-CM | POA: Insufficient documentation

## 2022-08-07 DIAGNOSIS — R195 Other fecal abnormalities: Secondary | ICD-10-CM | POA: Diagnosis not present

## 2022-08-07 DIAGNOSIS — K635 Polyp of colon: Secondary | ICD-10-CM | POA: Diagnosis not present

## 2022-08-07 DIAGNOSIS — Z1211 Encounter for screening for malignant neoplasm of colon: Secondary | ICD-10-CM | POA: Insufficient documentation

## 2022-08-07 HISTORY — PX: COLONOSCOPY WITH PROPOFOL: SHX5780

## 2022-08-07 HISTORY — PX: POLYPECTOMY: SHX5525

## 2022-08-07 LAB — GLUCOSE, CAPILLARY: Glucose-Capillary: 91 mg/dL (ref 70–99)

## 2022-08-07 LAB — HM COLONOSCOPY

## 2022-08-07 SURGERY — COLONOSCOPY WITH PROPOFOL
Anesthesia: General

## 2022-08-07 MED ORDER — LACTATED RINGERS IV SOLN: INTRAVENOUS | Status: DC

## 2022-08-07 MED ORDER — PROPOFOL 500 MG/50ML IV EMUL
INTRAVENOUS | Status: DC | PRN
  Administered 2022-08-07: 125 ug/kg/min via INTRAVENOUS

## 2022-08-07 MED ORDER — PROPOFOL 10 MG/ML IV BOLUS
INTRAVENOUS | Status: DC | PRN
  Administered 2022-08-07: 20 mg via INTRAVENOUS
  Administered 2022-08-07: 160 mg via INTRAVENOUS

## 2022-08-07 NOTE — Transfer of Care (Signed)
Immediate Anesthesia Transfer of Care Note  Patient: Bartley Vuolo  Procedure(s) Performed: COLONOSCOPY WITH PROPOFOL POLYPECTOMY  Patient Location: Short Stay  Anesthesia Type:General  Level of Consciousness: awake and patient cooperative  Airway & Oxygen Therapy: Patient Spontanous Breathing  Post-op Assessment: Report given to RN and Post -op Vital signs reviewed and stable  Post vital signs: Reviewed and stable  Last Vitals:  Vitals Value Taken Time  BP 87/50 08/07/22 1151  Temp 36.5 C 08/07/22 1151  Pulse 73 08/07/22 1151  Resp 18 08/07/22 1151  SpO2 93 % 08/07/22 1151    Last Pain:  Vitals:   08/07/22 1151  TempSrc: Oral  PainSc: 0-No pain      Patients Stated Pain Goal: 5 (08/07/22 0955)  Complications: No notable events documented.

## 2022-08-07 NOTE — Anesthesia Preprocedure Evaluation (Signed)
Anesthesia Evaluation  Patient identified by MRN, date of birth, ID band Patient awake    Reviewed: Allergy & Precautions, H&P , NPO status , Patient's Chart, lab work & pertinent test results  Airway Mallampati: III  TM Distance: >3 FB Neck ROM: Full    Dental  (+) Dental Advisory Given, Teeth Intact   Pulmonary Current Smoker and Patient abstained from smoking.   Pulmonary exam normal breath sounds clear to auscultation       Cardiovascular Exercise Tolerance: Good hypertension, Pt. on medications Normal cardiovascular exam Rhythm:Regular Rate:Normal     Neuro/Psych negative neurological ROS  negative psych ROS   GI/Hepatic Neg liver ROS,GERD  Medicated and Controlled,,  Endo/Other  diabetes, Well Controlled, Type 2, Insulin Dependent    Renal/GU Renal disease  negative genitourinary   Musculoskeletal  (+) Arthritis , Osteoarthritis,    Abdominal   Peds negative pediatric ROS (+)  Hematology negative hematology ROS (+)   Anesthesia Other Findings   Reproductive/Obstetrics negative OB ROS                             Anesthesia Physical Anesthesia Plan  ASA: 2  Anesthesia Plan: General   Post-op Pain Management: Minimal or no pain anticipated   Induction: Intravenous  PONV Risk Score and Plan: 1 and Propofol infusion  Airway Management Planned: Nasal Cannula and Natural Airway  Additional Equipment:   Intra-op Plan:   Post-operative Plan:   Informed Consent: I have reviewed the patients History and Physical, chart, labs and discussed the procedure including the risks, benefits and alternatives for the proposed anesthesia with the patient or authorized representative who has indicated his/her understanding and acceptance.     Dental advisory given  Plan Discussed with: CRNA and Surgeon  Anesthesia Plan Comments:        Anesthesia Quick Evaluation

## 2022-08-07 NOTE — Op Note (Signed)
[facility] Patient Name: Tim Decker Procedure Date: 08/07/2022 11:14 AM MRN: [id number] Date of Birth: 10/26/1955 Attending MD: Hennie Duos. Marletta Lor , [place], [id number] CSN: [id number] Age: 67 Admit Type: Outpatient Procedure:                Colonoscopy Indications:              Positive Cologuard test Providers:                Hennie Duos. Marletta Lor, DO, Jannett Celestine, RN, Pandora Leiter, Technician Referring MD:              Medicines:                See the Anesthesia note for documentation of the                            administered medications Complications:            No immediate complications. Estimated Blood Loss:     Estimated blood loss was minimal. Procedure:                Pre-Anesthesia Assessment:                           - The anesthesia plan was to use monitored                            anesthesia care (MAC).                           After obtaining informed consent, the colonoscope                            was passed under direct vision. Throughout the                            procedure, the patient's blood pressure, pulse, and                            oxygen saturations were monitored continuously. The                            [id number]-[id number] ([id number]) was introduced through the                            anus and advanced to the the cecum, identified by                            appendiceal orifice and ileocecal valve. The                            colonoscopy was performed without difficulty. The                            patient tolerated the procedure well. The quality  of the bowel preparation was evaluated using the                            BBPS [facility] Bowel Preparation Scale) with scores                            of: Right Colon = 3, Transverse Colon = 3 and Left                            Colon = 3 (entire mucosa seen well with no residual                            staining, small fragments of  stool or opaque                            liquid). The total BBPS score equals 9. Scope In: 11:27:47 AM Scope Out: 11:43:06 AM Scope Withdrawal Time: 0 hours 13 minutes 27 seconds  Total Procedure Duration: 0 hours 15 minutes 19 seconds  Findings:      Non-bleeding internal hemorrhoids were found during endoscopy.      Two sessile polyps were found in the ascending colon. The polyps were 4       to 5 mm in size. These polyps were removed with a cold snare. Resection       and retrieval were complete.      Four sessile polyps were found in the descending colon and transverse       colon. The polyps were 4 to 8 mm in size. These polyps were removed with       a cold snare. Resection and retrieval were complete.      The exam was otherwise without abnormality. Impression:               - Non-bleeding internal hemorrhoids.                           - Two 4 to 5 mm polyps in the ascending colon,                            removed with a cold snare. Resected and retrieved.                           - Four 4 to 8 mm polyps in the descending colon and                            in the transverse colon, removed with a cold snare.                            Resected and retrieved.                           - The examination was otherwise normal. Moderate Sedation:      Per Anesthesia Care Recommendation:           - Patient has a contact number available for  emergencies. The signs and symptoms of potential                            delayed complications were discussed with the                            patient. Return to normal activities tomorrow.                            Written discharge instructions were provided to the                            patient.                           - Resume previous diet.                           - Continue present medications.                           - Await pathology results.                           - Repeat  colonoscopy in 3 years for surveillance.                           - Return to GI clinic PRN. Procedure Code(s):        --- Professional ---                           [phone], Colonoscopy, flexible; with removal of                            tumor(s), polyp(s), or other lesion(s) by snare                            technique Diagnosis Code(s):        --- Professional ---                           D12.2, Benign neoplasm of ascending colon                           D12.4, Benign neoplasm of descending colon                           D12.3, Benign neoplasm of transverse colon (hepatic                            flexure or splenic flexure)                           K64.8, Other hemorrhoids                           R19.5, Other fecal abnormalities CPT copyright 2022  American Medical Association. All rights reserved. The codes documented in this report are preliminary and upon coder review may  be revised to meet current compliance requirements. Hennie Duos. Marletta Lor, DO Hennie Duos. Marletta Lor, DO 08/07/2022 11:46:35 AM This report has been signed electronically. Number of Addenda: 0

## 2022-08-07 NOTE — Anesthesia Postprocedure Evaluation (Signed)
Anesthesia Post Note  Patient: Tim Decker  Procedure(s) Performed: COLONOSCOPY WITH PROPOFOL POLYPECTOMY  Patient location during evaluation: Phase II Anesthesia Type: General Level of consciousness: awake and alert and oriented Pain management: pain level controlled Vital Signs Assessment: post-procedure vital signs reviewed and stable Respiratory status: spontaneous breathing, nonlabored ventilation and respiratory function stable Cardiovascular status: blood pressure returned to baseline and stable Postop Assessment: no apparent nausea or vomiting Anesthetic complications: no  No notable events documented.   Last Vitals:  Vitals:   08/07/22 1006 08/07/22 1151  BP: 131/86 (!) 87/50  Pulse: 70 73  Resp: 18 18  Temp: 36.6 C 36.5 C  SpO2: 99% 93%    Last Pain:  Vitals:   08/07/22 1151  TempSrc: Oral  PainSc: 0-No pain                 Chrystine Frogge C Arien Morine

## 2022-08-07 NOTE — Anesthesia Procedure Notes (Signed)
Date/Time: 08/07/2022 11:22 AM  Performed by: Franco Nones, CRNAPre-anesthesia Checklist: Patient identified, Emergency Drugs available, Suction available, Timeout performed and Patient being monitored Patient Re-evaluated:Patient Re-evaluated prior to induction Oxygen Delivery Method: Nasal Cannula

## 2022-08-07 NOTE — H&P (Signed)
Primary Care Physician:  Etta Grandchild, MD Primary Gastroenterologist:  Dr. Marletta Lor  Pre-Procedure History & Physical: HPI:  Tim Decker is a 67 y.o. male is here  for a colonoscopy to be performed for positive Cologuard testing.  Past Medical History:  Diagnosis Date   GERD (gastroesophageal reflux disease)    Hyperlipidemia    Hypertension    Pre-diabetes    A1c 6%    Past Surgical History:  Procedure Laterality Date   colonoscopy with polypectomy  2009   Dr [facility]   TONSILLECTOMY AND ADENOIDECTOMY     WISDOM TOOTH EXTRACTION     two extraction    Prior to Admission medications   Medication Sig Start Date End Date Taking? Authorizing Provider  amLODipine (NORVASC) 10 MG tablet Take 1 tablet (10 mg total) by mouth daily. 05/30/22  Yes Etta Grandchild, MD  Ascorbic Acid (VITAMIN C) 1000 MG tablet Take 1,000 mg by mouth at bedtime.   Yes [provider]  aspirin EC 81 MG tablet Take 81 mg by mouth in the morning.   Yes [provider]  carvedilol (COREG) 25 MG tablet Take 1 tablet (25 mg total) by mouth 2 (two) times daily. 06/02/22 08/31/22 Yes Etta Grandchild, MD  Cholecalciferol (VITAMIN D3) 250 MCG (10000 UT) TABS Take 10,000 Units by mouth at bedtime.   Yes [provider]  doxazosin (CARDURA) 2 MG tablet Take 1 tablet (2 mg total) by mouth daily. 06/02/22  Yes Etta Grandchild, MD  irbesartan (AVAPRO) 300 MG tablet Take 1 tablet (300 mg total) by mouth daily. 09/09/21  Yes Jodelle Red, MD  Multiple Vitamin (MULTIVITAMIN WITH MINERALS) TABS tablet Take 1 tablet by mouth in the morning.   Yes [provider]  omega-3 acid ethyl esters (LOVAZA) 1 g capsule Take 2 capsules (2 g total) by mouth 2 (two) times daily. 05/31/22  Yes Etta Grandchild, MD  omeprazole (PRILOSEC) 40 MG capsule Take 1 capsule (40 mg total) by mouth daily. 06/02/22  Yes Etta Grandchild, MD  rosuvastatin (CRESTOR) 10 MG tablet Take 1 tablet (10 mg  total) by mouth daily. 06/02/22  Yes Etta Grandchild, MD  spironolactone (ALDACTONE) 25 MG tablet Take 1 tablet (25 mg total) by mouth daily. 06/02/22  Yes Etta Grandchild, MD  TOUJEO MAX SOLOSTAR 300 UNIT/ML Solostar Pen INJECT 30 UNITS SUBCUTANEOUSLY ONCE DAILY 08/02/22  Yes Etta Grandchild, MD  Continuous Blood Gluc Receiver (DEXCOM G7 RECEIVER) DEVI 1 Act by Does not apply route daily. Patient not taking: Reported on 07/03/2022 05/31/22   Etta Grandchild, MD  Continuous Blood Gluc Sensor (DEXCOM G7 SENSOR) MISC 1 Act by Does not apply route daily. Patient not taking: Reported on 07/03/2022 05/31/22   Etta Grandchild, MD  Insulin Pen Needle 32G X 6 MM MISC 1 Act by Does not apply route daily. 05/31/22   Etta Grandchild, MD  polyethylene glycol-electrolytes (NULYTELY) 420 g solution See admin instructions. 07/04/22   [provider]  tirzepatide Greggory Keen) 2.5 MG/0.5ML Pen Inject 2.5 mg into the skin once a week. 05/31/22   Etta Grandchild, MD    Allergies as of 07/04/2022 - Review Complete 07/03/2022  Allergen Reaction Noted   Metformin and related Diarrhea 09/25/2019    Family History  Problem Relation Age of Onset   Heart attack Father 66   Diabetes Mother    Heart attack Paternal Grandfather        ?  65   Cancer Neg Hx    Stroke Neg Hx     Social History   Socioeconomic History   Marital status: Divorced    Spouse name: Not on file   Number of children: Not on file   Years of education: Not on file   Highest education level: Not on file  Occupational History   Not on file  Tobacco Use   Smoking status: Every Day    Packs/day: 0.50    Years: 41.00    Additional pack years: 0.00    Total pack years: 20.50    Types: Cigarettes    Passive exposure: Never   Smokeless tobacco: Never   Tobacco comments:    smoked 1972-1993, up to 1/2 ppd. As of 02/24/15 5 cig/day  Vaping Use   Vaping Use: Never used  Substance and Sexual Activity   Alcohol use: Not Currently   Drug  use: No   Sexual activity: Yes    Partners: Female  Other Topics Concern   Not on file  Social History Narrative   Not on file   Social Determinants of Health   Financial Resource Strain: Low Risk  (11/04/2021)   Overall Financial Resource Strain (CARDIA)    Difficulty of Paying Living Expenses: Not hard at all  Food Insecurity: No Food Insecurity (11/04/2021)   Hunger Vital Sign    Worried About Running Out of Food in the Last Year: Never true    Ran Out of Food in the Last Year: Never true  Transportation Needs: No Transportation Needs (11/04/2021)   PRAPARE - Administrator, Civil Service (Medical): No    Lack of Transportation (Non-Medical): No  Physical Activity: Sufficiently Active (11/04/2021)   Exercise Vital Sign    Days of Exercise per Week: 5 days    Minutes of Exercise per Session: 30 min  Stress: No Stress Concern Present (11/04/2021)   [employer] of Occupational Health - Occupational Stress Questionnaire    Feeling of Stress : Not at all  Social Connections: Socially Integrated (11/04/2021)   Social Connection and Isolation Panel [NHANES]    Frequency of Communication with Friends and Family: Three times a week    Frequency of Social Gatherings with Friends and Family: Three times a week    Attends Religious Services: 1 to 4 times per year    Active Member of Clubs or Organizations: Yes    Attends Banker Meetings: 1 to 4 times per year    Marital Status: Living with partner  Intimate Partner Violence: Not At Risk (11/04/2021)   Humiliation, Afraid, Rape, and Kick questionnaire    Fear of Current or Ex-Partner: No    Emotionally Abused: No    Physically Abused: No    Sexually Abused: No    Review of Systems: See HPI, otherwise negative ROS  Physical Exam: Vital signs in last 24 hours: Temp:  [97.8 F (36.6 C)] 97.8 F (36.6 C) (04/22 1006) Pulse Rate:  [70] 70 (04/22 1006) Resp:  [18] 18 (04/22 1006) BP: (131)/(86) 131/86  (04/22 1006) SpO2:  [99 %] 99 % (04/22 1006) Weight:  [102.1 kg] 102.1 kg (04/22 0955)   General:   Alert,  Well-developed, well-nourished, pleasant and cooperative in NAD Head:  Normocephalic and atraumatic. Eyes:  Sclera clear, no icterus.   Conjunctiva pink. Ears:  Normal auditory acuity. Nose:  No deformity, discharge,  or lesions. Msk:  Symmetrical without gross deformities. Normal posture. Extremities:  Without clubbing  or edema. Neurologic:  Alert and  oriented x4;  grossly normal neurologically. Skin:  Intact without significant lesions or rashes. Psych:  Alert and cooperative. Normal mood and affect.  Impression/Plan: Tim Decker is here for a colonoscopy to be performed for positive Cologuard testing.  The risks of the procedure including infection, bleed, or perforation as well as benefits, limitations, alternatives and imponderables have been reviewed with the patient. Questions have been answered. All parties agreeable.

## 2022-08-07 NOTE — Discharge Instructions (Addendum)
°  Colonoscopy °Discharge Instructions ° °Read the instructions outlined below and refer to this sheet in the next few weeks. These discharge instructions provide you with general information on caring for yourself after you leave the hospital. Your doctor may also give you specific instructions. While your treatment has been planned according to the most current medical practices available, unavoidable complications occasionally occur.  ° °ACTIVITY °You may resume your regular activity, but move at a slower pace for the next 24 hours.  °Take frequent rest periods for the next 24 hours.  °Walking will help get rid of the air and reduce the bloated feeling in your belly (abdomen).  °No driving for 24 hours (because of the medicine (anesthesia) used during the test).   °Do not sign any important legal documents or operate any machinery for 24 hours (because of the anesthesia used during the test).  °NUTRITION °Drink plenty of fluids.  °You may resume your normal diet as instructed by your doctor.  °Begin with a light meal and progress to your normal diet. Heavy or fried foods are harder to digest and may make you feel sick to your stomach (nauseated).  °Avoid alcoholic beverages for 24 hours or as instructed.  °MEDICATIONS °You may resume your normal medications unless your doctor tells you otherwise.  °WHAT YOU CAN EXPECT TODAY °Some feelings of bloating in the abdomen.  °Passage of more gas than usual.  °Spotting of blood in your stool or on the toilet paper.  °IF YOU HAD POLYPS REMOVED DURING THE COLONOSCOPY: °No aspirin products for 7 days or as instructed.  °No alcohol for 7 days or as instructed.  °Eat a soft diet for the next 24 hours.  °FINDING OUT THE RESULTS OF YOUR TEST °Not all test results are available during your visit. If your test results are not back during the visit, make an appointment with your caregiver to find out the results. Do not assume everything is normal if you have not heard from your  caregiver or the medical facility. It is important for you to follow up on all of your test results.  °SEEK IMMEDIATE MEDICAL ATTENTION IF: °You have more than a spotting of blood in your stool.  °Your belly is swollen (abdominal distention).  °You are nauseated or vomiting.  °You have a temperature over 101.  °You have abdominal pain or discomfort that is severe or gets worse throughout the day.  ° °Your colonoscopy revealed 6 polyp(s) which I removed successfully. Await pathology results, my office will contact you. I recommend repeating colonoscopy in 3 years for surveillance purposes. Otherwise follow up with GI as needed.  ° ° °I hope you have a great rest of your week! ° °Charles K. Carver, D.O. °Gastroenterology and Hepatology °[facility] ° °

## 2022-08-08 LAB — SURGICAL PATHOLOGY

## 2022-08-11 ENCOUNTER — Encounter (HOSPITAL_COMMUNITY): Payer: Self-pay | Admitting: Internal Medicine

## 2022-08-28 ENCOUNTER — Telehealth: Payer: Self-pay | Admitting: Internal Medicine

## 2022-08-28 DIAGNOSIS — E785 Hyperlipidemia, unspecified: Secondary | ICD-10-CM

## 2022-08-28 MED ORDER — ROSUVASTATIN CALCIUM 10 MG PO TABS
10.0000 mg | ORAL_TABLET | Freq: Every day | ORAL | 0 refills | Status: DC
Start: 2022-08-28 — End: 2022-12-04

## 2022-08-28 NOTE — Telephone Encounter (Signed)
Patients insurance called requested refill for rosuvastatin (CRESTOR) 10 MG tablet .

## 2022-09-02 ENCOUNTER — Other Ambulatory Visit: Payer: Self-pay | Admitting: Internal Medicine

## 2022-09-02 DIAGNOSIS — N400 Enlarged prostate without lower urinary tract symptoms: Secondary | ICD-10-CM

## 2022-09-02 DIAGNOSIS — E876 Hypokalemia: Secondary | ICD-10-CM

## 2022-09-02 DIAGNOSIS — E119 Type 2 diabetes mellitus without complications: Secondary | ICD-10-CM

## 2022-09-02 DIAGNOSIS — I119 Hypertensive heart disease without heart failure: Secondary | ICD-10-CM

## 2022-09-02 DIAGNOSIS — Z794 Long term (current) use of insulin: Secondary | ICD-10-CM

## 2022-09-02 DIAGNOSIS — I10 Essential (primary) hypertension: Secondary | ICD-10-CM

## 2022-09-02 DIAGNOSIS — K219 Gastro-esophageal reflux disease without esophagitis: Secondary | ICD-10-CM

## 2022-09-07 ENCOUNTER — Other Ambulatory Visit: Payer: Self-pay | Admitting: *Deleted

## 2022-09-07 DIAGNOSIS — Z87891 Personal history of nicotine dependence: Secondary | ICD-10-CM

## 2022-09-07 DIAGNOSIS — Z122 Encounter for screening for malignant neoplasm of respiratory organs: Secondary | ICD-10-CM

## 2022-09-07 DIAGNOSIS — F17210 Nicotine dependence, cigarettes, uncomplicated: Secondary | ICD-10-CM

## 2022-09-08 NOTE — Progress Notes (Signed)
Cardiology Office Note:    Date:  09/12/2022   ID:  Tim Decker, DOB 1955-10-15, MRN [id number]  PCP:  Etta Grandchild, MD  Cardiologist:  Jodelle Red, MD  Referring MD: Etta Grandchild, MD   CC: Follow-up  History of Present Illness:    Tim Decker is a 67 y.o. male with a hx of cardiomyopathy, hyperlipidemia, hypertension, and pre-diabetes, who is seen for follow-up. I initially met him 02/08/2021 as a new consult at the request of Etta Grandchild, MD for the evaluation and management of abnormal electrocardiogram results.   CV history: echo 2020 with EF 35-40%. Severe LVH. Myoview 2020 EF 44%, mild diffuse hypokinesis. Small fixed mild defect in apical area, no ischemia. LVEF normalized to 55-60% 05/2021. We have discussed Entresto which he declined due to cost.  Fhx: his father died of a myocardial infarction. Smoking is heavy in his family. His sister smokes, and has suffered a stroke and had a CABG procedure.  At his last visit his blood pressure was well controlled in the office and sometimes controlled at home. We discussed tracking his home readings and contacting me if consistently higher than 130/80. He was staying busy and active, but denied strenuous activity. He complained of low energy levels at times which he felt was due to his age. A long time ago he quit drinking for the most part. However, he continued to smoke less than a pack a day. He was not ready to quit.  He had a positive Cologuard screening and subsequently underwent colonoscopy 08/07/2022. He was found to have non-bleeding internal hemorrhoids and multiple polyps that were removed.  Today, he is feeling well. He presents a log of his home blood pressures prior to taking his antihypertensives. He did take them this morning and his BP in the office today is 126/76.  He also notes that his continuous glucose monitor has different readings than his manual reader. We reviewed this in  detail.  He recently has recovered from a lingering chest cold.   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 He continues to smoke up to 1 ppd on average, but likely a little less than that.  He denies any palpitations, chest pain, shortness of breath, or peripheral edema. No lightheadedness, headaches, syncope, orthopnea, or PND.   Past Medical History:  Diagnosis Date   GERD (gastroesophageal reflux disease)    Hyperlipidemia    Hypertension    Pre-diabetes    A1c 6%    Past Surgical History:  Procedure Laterality Date   colonoscopy with polypectomy  2009   Dr Leafy [facility]   COLONOSCOPY WITH PROPOFOL N/A 08/07/2022   Procedure: COLONOSCOPY WITH PROPOFOL;  Surgeon: Lanelle Bal, DO;  Location: [facility];  Service: Endoscopy;  Laterality: N/A;  11:30 AM   POLYPECTOMY  08/07/2022   Procedure: POLYPECTOMY;  Surgeon: Lanelle Bal, DO;  Location: [facility];  Service: Endoscopy;;   TONSILLECTOMY AND ADENOIDECTOMY     WISDOM TOOTH EXTRACTION     two extraction    Current Medications: Current Outpatient Medications on File Prior to Visit  Medication Sig   amLODipine (NORVASC) 10 MG tablet Take 1 tablet by mouth once daily   Ascorbic Acid (VITAMIN C) 1000 MG tablet Take 1,000 mg by mouth at bedtime.   aspirin EC 81 MG tablet Take 81 mg by mouth in the morning.   Cholecalciferol (VITAMIN D3) 250  MCG (10000 UT) TABS Take 10,000 Units by mouth at bedtime.   Continuous Blood Gluc Receiver (DEXCOM G7 RECEIVER) DEVI 1 Act by Does not apply route daily.   Continuous Blood Gluc Sensor (DEXCOM G7 SENSOR) MISC 1 Act by Does not apply route daily.   doxazosin (CARDURA) 2 MG tablet Take 1 tablet by mouth once daily   Insulin Pen Needle 32G X 6 MM MISC 1 Act by Does not apply route daily.   irbesartan (AVAPRO) 300 MG tablet Take 1 tablet (300 mg total) by mouth daily.   Multiple Vitamin (MULTIVITAMIN WITH MINERALS)  TABS tablet Take 1 tablet by mouth in the morning.   omega-3 acid ethyl esters (LOVAZA) 1 g capsule Take 2 capsules (2 g total) by mouth 2 (two) times daily.   omeprazole (PRILOSEC) 40 MG capsule Take 1 capsule by mouth once daily   rosuvastatin (CRESTOR) 10 MG tablet Take 1 tablet (10 mg total) by mouth daily. APPOINTMENT NEEDED FOR ADDITIONAL REFILLS   spironolactone (ALDACTONE) 25 MG tablet Take 1 tablet by mouth once daily   TOUJEO MAX SOLOSTAR 300 UNIT/ML Solostar Pen INJECT 30 UNITS SUBCUTANEOUSLY ONCE DAILY   carvedilol (COREG) 25 MG tablet Take 1 tablet (25 mg total) by mouth 2 (two) times daily.   No current facility-administered medications on file prior to visit.     Allergies:   Patient has no known allergies.   Social History   Tobacco Use   Smoking status: Every Day    Packs/day: 0.50    Years: 41.00    Additional pack years: 0.00    Total pack years: 20.50    Types: Cigarettes    Passive exposure: Never   Smokeless tobacco: Never   Tobacco comments:    smoked 1972-1993, up to 1/2 ppd. As of 02/24/15 5 cig/day  Vaping Use   Vaping Use: Never used  Substance Use Topics   Alcohol use: Not Currently   Drug use: No    Family History: family history includes Diabetes in his mother; Heart attack in his paternal grandfather; Heart attack (age of onset: 58) in his father. There is no history of Cancer or Stroke. Father died at 1 yo of second or third heart attack. Mother has diabetes, on insulin.  ROS:   Please see the history of present illness. All other systems are reviewed and negative.    EKGs/Labs/Other Studies Reviewed:    The following studies were reviewed today:  Echo 05/20/2021: Sonographer Comments: Patient is morbidly obese and suboptimal parasternal  window. Image acquisition challenging due to patient body habitus.   IMPRESSIONS   1. Left ventricular ejection fraction, by estimation, is 55 to 60%. The  left ventricle has normal function. The left  ventricle has no regional  wall motion abnormalities. There is mild concentric left ventricular  hypertrophy. Left ventricular diastolic  parameters are indeterminate. The average left ventricular global  longitudinal strain is -13.5 %. The global longitudinal strain is  abnormal.   2. Right ventricular systolic function is normal. The right ventricular  size is normal.   3. Left atrial size was mildly dilated.   4. The mitral valve is grossly normal. Trivial mitral valve  regurgitation. No evidence of mitral stenosis.   5. The aortic valve is grossly normal. There is mild calcification of the  aortic valve. Aortic valve regurgitation is not visualized. No aortic  stenosis is present.   6. Aortic dilatation noted. There is borderline dilatation of the  ascending aorta, measuring  38 mm.   Comparison(s): Changes from prior study are noted. EF 35%, severe LVH, AOV  calcifications mild-mod, GLS -15.9%.   Conclusion(s)/Recommendation(s): EF significantly improved (EF within  normal limits, strain mildly abnormal) compared to prior.   Echo 02/26/2019:  1. Left ventricular ejection fraction, by visual estimation, is 35 to  40%. The left ventricle has mildly decreased function. There is severely  increased left ventricular hypertrophy.   2. Left ventricular diastolic parameters are indeterminate.   3. Global right ventricle has normal systolic function.The right  ventricular size is normal. No increase in right ventricular wall  thickness.   4. Left atrial size was normal.   5. Right atrial size was normal.   6. Mild to moderate aortic valve annular calcification.   7. The mitral valve is normal in structure. No evidence of mitral valve  regurgitation. No evidence of mitral stenosis.   8. The tricuspid valve is normal in structure. Tricuspid valve  regurgitation is not demonstrated.   9. The aortic valve is tricuspid. Aortic valve regurgitation is not  visualized.  10. There is Mild  thickening of the aortic valve.  11. There is Moderate calcification of the aortic valve.  12. The pulmonic valve was normal in structure. Pulmonic valve  regurgitation is not visualized.  13. The atrial septum is grossly normal.  14. The average left ventricular global longitudinal strain is -15.9 %.   Nuclear Stress Test 02/19/2019: Nuclear stress EF: 44%. Mild diffuse hypokinesis The left ventricular ejection fraction is mildly decreased (45-54%). There was no ST segment deviation noted during stress. Defect 1: There is a small defect of mild severity present in the apical inferior and apical lateral location. Fixed defect. This is an intermediate risk study based upon reduced EF. Consider echocardiogram for verification. There is no ischemia identified.  Carotid Duplex 05/16/2017: Final Interpretation:  Right Carotid: Velocities in the right ICA are consistent with a 1-39%  stenosis.   Left Carotid: Velocities in the left ICA are consistent with a 1-39%  stenosis.   Vertebrals:  Both vertebral arteries were patent with antegrade flow.  Subclavians: Normal flow hemodynamics were seen in bilateral subclavian arteries.   EKG:  EKG is personally reviewed.   09/12/2022:  EKG was not ordered. 03/15/2022:  NSR at 68 bpm 09/09/2021: not ordered 05/11/2021: not ordered 02/08/2021: Normal sinus rhythm at 74 bpm. Incomplete RBBB, borderline LVH  Recent Labs: 05/30/2022: ALT 38; BUN 19; Creatinine, Ser 1.55; Hemoglobin 14.5; Platelets 242.0; Potassium 4.7; Sodium 137; TSH 2.43   Recent Lipid Panel    Component Value Date/Time   CHOL 223 (H) 05/30/2022 1606   CHOL 162 05/11/2021 1050   CHOL 185 01/15/2014 0836   TRIG (H) 05/30/2022 1606    1162.0 Triglyceride is over 400; calculations on Lipids are invalid.   TRIG 327 (H) 01/15/2014 0836   TRIG 307 (HH) 02/20/2006 0839   HDL 22.80 (L) 05/30/2022 1606   HDL 24 (L) 05/11/2021 1050   HDL 27 (L) 01/15/2014 0836   CHOLHDL 10 05/30/2022  1606   VLDL 51.8 (H) 07/11/2021 1346   LDLCALC 53 05/11/2021 1050   LDLCALC 93 01/15/2014 0836   LDLDIRECT 37.0 05/30/2022 1606    Physical Exam:    VS:  BP 126/76   Pulse 77   Ht 5\' 9"  (1.753 m)   Wt 235 lb (106.6 kg)   BMI 34.70 kg/m     Wt Readings from Last 3 Encounters:  09/12/22 235 lb (106.6 kg)  08/07/22 225 lb (102.1 kg)  08/02/22 236 lb 15.9 oz (107.5 kg)    GEN: Well nourished, well developed in no acute distress HEENT: Normal, moist mucous membranes NECK: No JVD CARDIAC: regular rhythm, normal S1 and S2, no rubs or gallops. No murmur. VASCULAR: Radial and DP pulses 2+ bilaterally. No carotid bruits RESPIRATORY:  Clear to auscultation without rales, wheezing or rhonchi  ABDOMEN: Soft, non-tender, non-distended MUSCULOSKELETAL:  Ambulates independently SKIN: Warm and dry, no edema NEUROLOGIC:  Alert and oriented x 3. No focal neuro deficits noted. PSYCHIATRIC:  Normal affect    ASSESSMENT:    1. Hypertensive left ventricular hypertrophy, without heart failure   2. NICM (nonischemic cardiomyopathy) (HCC)   3. Essential hypertension   4. Stage 3b chronic kidney disease (HCC)   5. Mixed hyperlipidemia   6. Insulin-requiring or dependent type II diabetes mellitus (HCC)   7. Tobacco abuse     PLAN:    Cardiomyopathy, suspect nonischemic due to stress test, likely 2/2 hypertensive heart disease Severe LVH Chronic kidney disease, most recent stage 3b (GFR 40)  -NYHA class I -EF 35-40% in 02/2019-->normalized to 55-60% 05/2021 -had myoview at that time without ischemia, has never had cath -on carvedilol -on spironolactone, irbesartan -was on dapagliflozin, no longer taking -we discussed entresto, declined due to cost  Hypertension: -on carvedilol, amlodipine, spironolactone, irbesartan, doxazosin -checks home BP, contacting me if consistently >130/80  Mixed hyperlipidemia -continue rosuvastatin -no longer taking lovaza 2/2 cost  Type II diabetes, with  proteinuria Obesity -on aspirin, rosuvastatin -no longer on dapagliflozin -now on insulin, on continuous glucose monitor  Tobacco abuse: not ready to quit  Cardiac risk counseling and prevention recommendations: has family history of heart disease -recommend heart healthy/Mediterranean diet, with whole grains, fruits, vegetable, fish, lean meats, nuts, and olive oil. Limit salt. -recommend moderate walking, 3-5 times/week for 30-50 minutes each session. Aim for at least 150 minutes.week. Goal should be pace of 3 miles/hours, or walking 1.5 miles in 30 minutes -recommend avoidance of tobacco products. Avoid excess alcohol. -ASCVD risk score: The 10-year ASCVD risk score (Arnett DK, et al., 2019) is: 53.9%   Values used to calculate the score:     Age: 1 years     Sex: Male     Is Non-Hispanic African American: No     Diabetic: Yes     Tobacco smoker: Yes     Systolic Blood Pressure: 126 mmHg     Is BP treated: Yes     HDL Cholesterol: 22.8 mg/dL     Total Cholesterol: 223 mg/dL    Plan for follow up: 6 months or sooner as needed.  Jodelle Red, MD, PhD, [facility]  [facility]    Medication Adjustments/Labs and Tests Ordered: Current medicines are reviewed at length with the patient today.  Concerns regarding medicines are outlined above.   No orders of the defined types were placed in this encounter.  No orders of the defined types were placed in this encounter.  Patient Instructions  Medication Instructions:  Your physician recommends that you continue on your current medications as directed. Please refer to the Current Medication list given to you today.  *If you need a refill on your cardiac medications before your next appointment, please call your pharmacy*  Follow-Up: At [facility], you and your health needs are our priority.  As part of our continuing mission to provide you with exceptional heart care, we have created designated  Provider Care Teams.  These Care Teams include your primary Cardiologist (physician) and Advanced Practice Providers (APPs -  Physician Assistants and Nurse Practitioners) who all work together to provide you with the care you need, when you need it.  We recommend signing up for the patient portal called "MyChart".  Sign up information is provided on this After Visit Summary.  MyChart is used to connect with patients for Virtual Visits (Telemedicine).  Patients are able to view lab/test results, encounter notes, upcoming appointments, etc.  Non-urgent messages can be sent to your provider as well.   To learn more about what you can do with MyChart, go to [email or web address].    Your next appointment:   6 month(s)  Provider:   Jodelle Red, MD or Gillian Shields, NP      [facility],acting as a scribe for Jodelle Red, MD.,have documented all relevant documentation on the behalf of Jodelle Red, MD,as directed by  Jodelle Red, MD while in the presence of Jodelle Red, MD.  I, Jodelle Red, MD, have reviewed all documentation for this visit. The documentation on 09/12/22 for the exam, diagnosis, procedures, and orders are all accurate and complete.   Signed, Jodelle Red, MD PhD 09/12/2022     [facility]

## 2022-09-12 ENCOUNTER — Ambulatory Visit (HOSPITAL_BASED_OUTPATIENT_CLINIC_OR_DEPARTMENT_OTHER): Payer: No Typology Code available for payment source | Admitting: Cardiology

## 2022-09-12 ENCOUNTER — Encounter (HOSPITAL_BASED_OUTPATIENT_CLINIC_OR_DEPARTMENT_OTHER): Payer: Self-pay | Admitting: Cardiology

## 2022-09-12 VITALS — BP 126/76 | HR 77 | Ht 69.0 in | Wt 235.0 lb

## 2022-09-12 DIAGNOSIS — E119 Type 2 diabetes mellitus without complications: Secondary | ICD-10-CM

## 2022-09-12 DIAGNOSIS — E782 Mixed hyperlipidemia: Secondary | ICD-10-CM | POA: Diagnosis not present

## 2022-09-12 DIAGNOSIS — Z720 Tobacco use: Secondary | ICD-10-CM | POA: Diagnosis not present

## 2022-09-12 DIAGNOSIS — N1832 Chronic kidney disease, stage 3b: Secondary | ICD-10-CM

## 2022-09-12 DIAGNOSIS — I119 Hypertensive heart disease without heart failure: Secondary | ICD-10-CM | POA: Diagnosis not present

## 2022-09-12 DIAGNOSIS — Z794 Long term (current) use of insulin: Secondary | ICD-10-CM

## 2022-09-12 DIAGNOSIS — I428 Other cardiomyopathies: Secondary | ICD-10-CM

## 2022-09-12 DIAGNOSIS — I10 Essential (primary) hypertension: Secondary | ICD-10-CM

## 2022-09-12 NOTE — Patient Instructions (Signed)
Medication Instructions:  Your physician recommends that you continue on your current medications as directed. Please refer to the Current Medication list given to you today.  *If you need a refill on your cardiac medications before your next appointment, please call your pharmacy*  Follow-Up: At [facility], you and your health needs are our priority.  As part of our continuing mission to provide you with exceptional heart care, we have created designated Provider Care Teams.  These Care Teams include your primary Cardiologist (physician) and Advanced Practice Providers (APPs -  Physician Assistants and Nurse Practitioners) who all work together to provide you with the care you need, when you need it.  We recommend signing up for the patient portal called "MyChart".  Sign up information is provided on this After Visit Summary.  MyChart is used to connect with patients for Virtual Visits (Telemedicine).  Patients are able to view lab/test results, encounter notes, upcoming appointments, etc.  Non-urgent messages can be sent to your provider as well.   To learn more about what you can do with MyChart, go to [email or web address].    Your next appointment:   6 month(s)  Provider:   Jodelle Red, MD or Gillian Shields, NP

## 2022-10-24 ENCOUNTER — Ambulatory Visit: Payer: No Typology Code available for payment source | Admitting: Primary Care

## 2022-10-24 ENCOUNTER — Encounter: Payer: Self-pay | Admitting: Primary Care

## 2022-10-24 DIAGNOSIS — F17200 Nicotine dependence, unspecified, uncomplicated: Secondary | ICD-10-CM | POA: Diagnosis not present

## 2022-10-24 NOTE — Patient Instructions (Signed)
Thank you for participating in the [facility]. It was our pleasure to meet you today. We will call you with the results of your scan within the next few days. Your scan will be assigned a Lung RADS category score by the physicians reading the scans.  This Lung RADS score determines follow up scanning.  See below for description of categories, and follow up screening recommendations. We will be in touch to schedule your follow up screening annually or based on recommendations of our providers. We will fax a copy of your scan results to your Primary Care Physician, or the physician who referred you to the program, to ensure they have the results. Please call the office if you have any questions or concerns regarding your scanning experience or results.  Our office number is [phone]. Please speak with Denise Phelps, RN. , or  Denise Buckner RN, They are  our Lung Cancer Screening RN.'s If They are unavailable when you call, Please leave a message on the voice mail. We will return your call at our earliest convenience.This voice mail is monitored several times a day.  Remember, if your scan is normal, we will scan you annually as long as you continue to meet the criteria for the program. (Age 50-80, Current smoker or smoker who has quit within the last 15 years). If you are a smoker, remember, quitting is the single most powerful action that you can take to decrease your risk of lung cancer and other pulmonary, breathing related problems. We know quitting is hard, and we are here to help.  Please let us know if there is anything we can do to help you meet your goal of quitting. If you are a former smoker, congratulations. We are proud of you! Remain smoke free! Remember you can refer friends or family members through the number above.  We will screen them to make sure they meet criteria for the program. Thank you for helping us take better care of you by  participating in Lung Screening.  You can receive free nicotine replacement therapy ( patches, gum or mints) by calling 1-800-QUIT NOW. Please call so we can get you on the path to becoming  a non-smoker. I know it is hard, but you can do this!  Lung RADS Categories:  Lung RADS 1: no nodules or definitely non-concerning nodules.  Recommendation is for a repeat annual scan in 12 months.  Lung RADS 2:  nodules that are non-concerning in appearance and behavior with a very low likelihood of becoming an active cancer. Recommendation is for a repeat annual scan in 12 months.  Lung RADS 3: nodules that are probably non-concerning , includes nodules with a low likelihood of becoming an active cancer.  Recommendation is for a 6-month repeat screening scan. Often noted after an upper respiratory illness. We will be in touch to make sure you have no questions, and to schedule your 6-month scan.  Lung RADS 4 A: nodules with concerning findings, recommendation is most often for a follow up scan in 3 months or additional testing based on our provider's assessment of the scan. We will be in touch to make sure you have no questions and to schedule the recommended 3 month follow up scan.  Lung RADS 4 B:  indicates findings that are concerning. We will be in touch with you to schedule additional diagnostic testing based on our provider's  assessment of the scan.  Other options for assistance in smoking cessation (   As covered by your insurance benefits)  Hypnosis for smoking cessation  [employer] [phone]  Acupuncture for smoking cessation  [facility] [phone]   

## 2022-10-24 NOTE — Progress Notes (Signed)
Virtual Visit via Telephone Note  I connected with [name] on 10/24/22 at  1:30 PM EDT by telephone and verified that I am speaking with the correct person using two identifiers.  Location: Patient: Home Provider: Office   I discussed the limitations, risks, security and privacy concerns of performing an evaluation and management service by telephone and the availability of in person appointments. I also discussed with the patient that there may be a patient responsible charge related to this service. The patient expressed understanding and agreed to proceed.   Shared Decision Making Visit Lung Cancer Screening Program [phone])   Eligibility: Age 67 y.o. Pack Years Smoking History Calculation 46 (# packs/per year x # years smoked) Recent History of coughing up blood  no Unexplained weight loss? no ( >Than 15 pounds within the last 6 months ) Prior History Lung / other cancer no (Diagnosis within the last 5 years already requiring surveillance chest CT Scans). Smoking Status Current Smoker Former Smokers: Years since quit: < 1 year  Quit Date: NA  Visit Components: Discussion included one or more decision making aids. yes Discussion included risk/benefits of screening. yes Discussion included potential follow up diagnostic testing for abnormal scans. yes Discussion included meaning and risk of over diagnosis. yes Discussion included meaning and risk of False Positives. yes Discussion included meaning of total radiation exposure. yes  Counseling Included: Importance of adherence to annual lung cancer LDCT screening. yes Impact of comorbidities on ability to participate in the program. yes Ability and willingness to under diagnostic treatment. yes  Smoking Cessation Counseling: Current Smokers:  Discussed importance of smoking cessation. yes Information about tobacco cessation classes and interventions provided to patient. yes Patient provided with "ticket" for  LDCT Scan. yes Symptomatic Patient. no  Counseling(Intermediate counseling: > three minutes) 99406 Diagnosis Code: Tobacco Use Z72.0 Asymptomatic Patient yes  Counseling (Intermediate counseling: > three minutes counseling) U0454 Former Smokers:  Discussed the importance of maintaining cigarette abstinence. yes Diagnosis Code: Personal History of Nicotine Dependence. U98.119 Information about tobacco cessation classes and interventions provided to patient. Yes Patient provided with "ticket" for LDCT Scan. Na Written Order for Lung Cancer Screening with LDCT placed in Epic. Yes (CT Chest Lung Cancer Screening Low Dose W/O CM) [id number] Z12.2-Screening of respiratory organs Z87.891-Personal history of nicotine dependence   I have spent 25 minutes of face to face/ virtual visit   time with Mr Heitman discussing the risks and benefits of lung cancer screening. We viewed / discussed a power point together that explained in detail the above noted topics. We paused at intervals to allow for questions to be asked and answered to ensure understanding.We discussed that the single most powerful action that he can take to decrease his risk of developing lung cancer is to quit smoking. We discussed whether or not he is ready to commit to setting a quit date. We discussed options for tools to aid in quitting smoking including nicotine replacement therapy, non-nicotine medications, support groups, Quit Smart classes, and behavior modification. We discussed that often times setting smaller, more achievable goals, such as eliminating 1 cigarette a day for a week and then 2 cigarettes a day for a week can be helpful in slowly decreasing the number of cigarettes smoked. This allows for a sense of accomplishment as well as providing a clinical benefit. I provided  him  with smoking cessation  information  with contact information for community resources, classes, free nicotine replacement therapy, and access to mobile  apps,  text messaging, and on-line smoking cessation help. I have also provided him  the office contact information in the event he needs to contact me, or the screening staff. We discussed the time and location of the scan, and that either Abigail Miyamoto RN, Karlton Lemon, RN  or I will call / send a letter with the results within 24-72 hours of receiving them. The patient verbalized understanding of all of  the above and had no further questions upon leaving the office. They have my contact information in the event they have any further questions.  I spent 3-5 minutes counseling on smoking cessation and the health risks of continued tobacco abuse.  I explained to the patient that there has been a high incidence of coronary artery disease noted on these exams. I explained that this is a non-gated exam therefore degree or severity cannot be determined. This patient is on statin therapy. I have asked the patient to follow-up with their PCP regarding any incidental finding of coronary artery disease and management with diet or medication as their PCP  feels is clinically indicated. The patient verbalized understanding of the above and had no further questions upon completion of the visit.   Glenford Bayley, NP

## 2022-10-25 ENCOUNTER — Ambulatory Visit: Payer: No Typology Code available for payment source

## 2022-10-25 DIAGNOSIS — Z87891 Personal history of nicotine dependence: Secondary | ICD-10-CM

## 2022-10-25 DIAGNOSIS — Z122 Encounter for screening for malignant neoplasm of respiratory organs: Secondary | ICD-10-CM | POA: Diagnosis not present

## 2022-10-25 DIAGNOSIS — F17210 Nicotine dependence, cigarettes, uncomplicated: Secondary | ICD-10-CM

## 2022-10-30 ENCOUNTER — Other Ambulatory Visit: Payer: Self-pay | Admitting: Acute Care

## 2022-10-30 DIAGNOSIS — F17210 Nicotine dependence, cigarettes, uncomplicated: Secondary | ICD-10-CM

## 2022-10-30 DIAGNOSIS — Z122 Encounter for screening for malignant neoplasm of respiratory organs: Secondary | ICD-10-CM

## 2022-10-30 DIAGNOSIS — Z87891 Personal history of nicotine dependence: Secondary | ICD-10-CM

## 2022-11-30 ENCOUNTER — Encounter (INDEPENDENT_AMBULATORY_CARE_PROVIDER_SITE_OTHER): Payer: Self-pay

## 2022-12-04 ENCOUNTER — Other Ambulatory Visit: Payer: Self-pay | Admitting: Internal Medicine

## 2022-12-04 ENCOUNTER — Other Ambulatory Visit (HOSPITAL_BASED_OUTPATIENT_CLINIC_OR_DEPARTMENT_OTHER): Payer: Self-pay | Admitting: Cardiology

## 2022-12-04 DIAGNOSIS — E781 Pure hyperglyceridemia: Secondary | ICD-10-CM

## 2022-12-04 DIAGNOSIS — I10 Essential (primary) hypertension: Secondary | ICD-10-CM

## 2022-12-04 DIAGNOSIS — I428 Other cardiomyopathies: Secondary | ICD-10-CM

## 2022-12-04 DIAGNOSIS — E1121 Type 2 diabetes mellitus with diabetic nephropathy: Secondary | ICD-10-CM

## 2022-12-04 DIAGNOSIS — I119 Hypertensive heart disease without heart failure: Secondary | ICD-10-CM

## 2022-12-04 DIAGNOSIS — N400 Enlarged prostate without lower urinary tract symptoms: Secondary | ICD-10-CM

## 2022-12-04 DIAGNOSIS — I429 Cardiomyopathy, unspecified: Secondary | ICD-10-CM

## 2022-12-04 DIAGNOSIS — E785 Hyperlipidemia, unspecified: Secondary | ICD-10-CM

## 2022-12-04 DIAGNOSIS — E876 Hypokalemia: Secondary | ICD-10-CM

## 2022-12-28 ENCOUNTER — Encounter: Payer: Self-pay | Admitting: Internal Medicine

## 2022-12-28 ENCOUNTER — Ambulatory Visit: Payer: No Typology Code available for payment source | Admitting: Internal Medicine

## 2022-12-28 VITALS — BP 114/66 | HR 64 | Temp 98.1°F | Ht 69.0 in | Wt 231.0 lb

## 2022-12-28 DIAGNOSIS — R809 Proteinuria, unspecified: Secondary | ICD-10-CM | POA: Diagnosis not present

## 2022-12-28 DIAGNOSIS — E781 Pure hyperglyceridemia: Secondary | ICD-10-CM | POA: Diagnosis not present

## 2022-12-28 DIAGNOSIS — K219 Gastro-esophageal reflux disease without esophagitis: Secondary | ICD-10-CM | POA: Diagnosis not present

## 2022-12-28 DIAGNOSIS — E119 Type 2 diabetes mellitus without complications: Secondary | ICD-10-CM

## 2022-12-28 DIAGNOSIS — E1122 Type 2 diabetes mellitus with diabetic chronic kidney disease: Secondary | ICD-10-CM

## 2022-12-28 DIAGNOSIS — M25511 Pain in right shoulder: Secondary | ICD-10-CM | POA: Diagnosis not present

## 2022-12-28 DIAGNOSIS — N1832 Type 2 diabetes mellitus with diabetic chronic kidney disease: Secondary | ICD-10-CM | POA: Insufficient documentation

## 2022-12-28 DIAGNOSIS — G8929 Other chronic pain: Secondary | ICD-10-CM

## 2022-12-28 DIAGNOSIS — N1831 Chronic kidney disease, stage 3a: Secondary | ICD-10-CM

## 2022-12-28 DIAGNOSIS — Z23 Encounter for immunization: Secondary | ICD-10-CM | POA: Diagnosis not present

## 2022-12-28 DIAGNOSIS — Z794 Long term (current) use of insulin: Secondary | ICD-10-CM

## 2022-12-28 DIAGNOSIS — I10 Essential (primary) hypertension: Secondary | ICD-10-CM

## 2022-12-28 LAB — MICROALBUMIN / CREATININE URINE RATIO
Creatinine,U: 144.7 mg/dL
Microalb Creat Ratio: 5 mg/g (ref 0.0–30.0)
Microalb, Ur: 7.2 mg/dL — ABNORMAL HIGH (ref 0.0–1.9)

## 2022-12-28 LAB — URINALYSIS, ROUTINE W REFLEX MICROSCOPIC
Bilirubin Urine: NEGATIVE
Hgb urine dipstick: NEGATIVE
Ketones, ur: NEGATIVE
Leukocytes,Ua: NEGATIVE
Nitrite: NEGATIVE
RBC / HPF: NONE SEEN (ref 0–?)
Specific Gravity, Urine: 1.02 (ref 1.000–1.030)
Total Protein, Urine: NEGATIVE
Urine Glucose: NEGATIVE
Urobilinogen, UA: 0.2 (ref 0.0–1.0)
pH: 6 (ref 5.0–8.0)

## 2022-12-28 LAB — CBC WITH DIFFERENTIAL/PLATELET
Basophils Absolute: 0 10*3/uL (ref 0.0–0.1)
Basophils Relative: 0.4 % (ref 0.0–3.0)
Eosinophils Absolute: 0.2 10*3/uL (ref 0.0–0.7)
Eosinophils Relative: 2.8 % (ref 0.0–5.0)
HCT: 46.9 % (ref 39.0–52.0)
Hemoglobin: 15.4 g/dL (ref 13.0–17.0)
Lymphocytes Relative: 21.6 % (ref 12.0–46.0)
Lymphs Abs: 1.6 10*3/uL (ref 0.7–4.0)
MCHC: 32.8 g/dL (ref 30.0–36.0)
MCV: 92.1 fl (ref 78.0–100.0)
Monocytes Absolute: 0.8 10*3/uL (ref 0.1–1.0)
Monocytes Relative: 10.7 % (ref 3.0–12.0)
Neutro Abs: 4.7 10*3/uL (ref 1.4–7.7)
Neutrophils Relative %: 64.5 % (ref 43.0–77.0)
Platelets: 267 10*3/uL (ref 150.0–400.0)
RBC: 5.09 Mil/uL (ref 4.22–5.81)
RDW: 15.1 % (ref 11.5–15.5)
WBC: 7.2 10*3/uL (ref 4.0–10.5)

## 2022-12-28 LAB — BASIC METABOLIC PANEL
BUN: 32 mg/dL — ABNORMAL HIGH (ref 6–23)
CO2: 25 meq/L (ref 19–32)
Calcium: 9.5 mg/dL (ref 8.4–10.5)
Chloride: 101 meq/L (ref 96–112)
Creatinine, Ser: 2.03 mg/dL — ABNORMAL HIGH (ref 0.40–1.50)
GFR: 33.24 mL/min — ABNORMAL LOW (ref >60.00)
Glucose, Bld: 90 mg/dL (ref 70–99)
Potassium: 5.1 meq/L (ref 3.5–5.1)
Sodium: 134 meq/L — ABNORMAL LOW (ref 135–145)

## 2022-12-28 LAB — HEMOGLOBIN A1C: Hgb A1c MFr Bld: 6.7 % — ABNORMAL HIGH (ref 4.6–6.5)

## 2022-12-28 LAB — TRIGLYCERIDES: Triglycerides: 426 mg/dL — ABNORMAL HIGH (ref 0.0–149.0)

## 2022-12-28 MED ORDER — EMPAGLIFLOZIN 25 MG PO TABS
25.0000 mg | ORAL_TABLET | Freq: Every day | ORAL | 1 refills | Status: DC
Start: 2022-12-28 — End: 2023-06-18

## 2022-12-28 NOTE — Progress Notes (Unsigned)
Subjective:  Patient ID: Tim Decker, male    DOB: 09-Feb-1956  Age: 67 y.o. MRN: [id number]  CC: Hyperlipidemia, Hypertension, and Diabetes   HPI Tim Decker presents for f/up ----  He is active and denies DOE, CP, SOB, edema.   He has been taking his mother's jardiance 10 mg per day.  Outpatient Medications Prior to Visit  Medication Sig Dispense Refill   amLODipine (NORVASC) 10 MG tablet Take 1 tablet by mouth once daily 90 tablet 0   Ascorbic Acid (VITAMIN C) 1000 MG tablet Take 1,000 mg by mouth at bedtime.     aspirin EC 81 MG tablet Take 81 mg by mouth in the morning.     carvedilol (COREG) 25 MG tablet Take 1 tablet by mouth twice daily 180 tablet 0   Cholecalciferol (VITAMIN D3) 250 MCG (10000 UT) TABS Take 10,000 Units by mouth at bedtime.     Continuous Blood Gluc Receiver (DEXCOM G7 RECEIVER) DEVI 1 Act by Does not apply route daily. 9 each 1   Continuous Blood Gluc Sensor (DEXCOM G7 SENSOR) MISC 1 Act by Does not apply route daily. 9 each 1   doxazosin (CARDURA) 2 MG tablet Take 1 tablet by mouth once daily 90 tablet 0   Insulin Pen Needle 32G X 6 MM MISC 1 Act by Does not apply route daily. 100 each 1   irbesartan (AVAPRO) 300 MG tablet Take 1 tablet by mouth once daily 90 tablet 2   Multiple Vitamin (MULTIVITAMIN WITH MINERALS) TABS tablet Take 1 tablet by mouth in the morning.     omega-3 acid ethyl esters (LOVAZA) 1 g capsule Take 2 capsules by mouth twice daily 120 capsule 0   omeprazole (PRILOSEC) 40 MG capsule Take 1 capsule by mouth once daily 90 capsule 0   rosuvastatin (CRESTOR) 10 MG tablet TAKE 1 TABLET BY MOUTH ONCE DAILY . APPOINTMENT REQUIRED FOR FUTURE REFILLS 90 tablet 0   spironolactone (ALDACTONE) 25 MG tablet Take 1 tablet by mouth once daily 90 tablet 0   TOUJEO MAX SOLOSTAR 300 UNIT/ML Solostar Pen INJECT 30 UNITS SUBCUTANEOUSLY ONCE DAILY 6 mL 0   No facility-administered medications prior to visit.    ROS Review of Systems   Constitutional: Negative.  Negative for diaphoresis and fatigue.  HENT: Negative.    Eyes: Negative.   Respiratory: Negative.  Negative for chest tightness, shortness of breath and wheezing.   Cardiovascular:  Negative for chest pain, palpitations and leg swelling.  Gastrointestinal: Negative.  Negative for abdominal pain, constipation, diarrhea, nausea and vomiting.  Endocrine: Negative.   Genitourinary: Negative.  Negative for difficulty urinating.  Musculoskeletal:  Positive for arthralgias. Negative for myalgias.       Chronic right shoulder pain, no recent trauma/injury  Skin: Negative.  Negative for color change.  Neurological: Negative.  Negative for dizziness and weakness.  Hematological:  Negative for adenopathy. Does not bruise/bleed easily.  Psychiatric/Behavioral: Negative.      Objective:  BP 114/66 (BP Location: Left Arm, Patient Position: Sitting, Cuff Size: Large)   Pulse 64   Temp 98.1 F (36.7 C) (Oral)   Ht 5\' 9"  (1.753 m)   Wt 231 lb (104.8 kg)   SpO2 94%   BMI 34.11 kg/m   BP Readings from Last 3 Encounters:  12/28/22 114/66  09/12/22 126/76  08/07/22 (!) 87/50    Wt Readings from Last 3 Encounters:  12/28/22 231 lb (104.8 kg)  10/25/22 235 lb (106.6 kg)  09/12/22  235 lb (106.6 kg)    Physical Exam Vitals reviewed.  Constitutional:      Appearance: Normal appearance.  HENT:     Mouth/Throat:     Mouth: Mucous membranes are moist.  Eyes:     General: No scleral icterus.    Conjunctiva/sclera: Conjunctivae normal.  Cardiovascular:     Rate and Rhythm: Normal rate and regular rhythm.     Heart sounds: No murmur heard.    No gallop.  Pulmonary:     Effort: Pulmonary effort is normal.     Breath sounds: No stridor. No wheezing, rhonchi or rales.  Abdominal:     General: Abdomen is protuberant. Bowel sounds are normal. There is no distension.     Palpations: There is no hepatomegaly or mass.     Tenderness: There is no abdominal tenderness.  There is no guarding.  Musculoskeletal:        General: Normal range of motion.     Cervical back: Neck supple.     Right lower leg: No edema.     Left lower leg: No edema.  Lymphadenopathy:     Cervical: No cervical adenopathy.  Skin:    General: Skin is warm and dry.  Neurological:     General: No focal deficit present.     Mental Status: He is alert.  Psychiatric:        Mood and Affect: Mood normal.        Behavior: Behavior normal.     Lab Results  Component Value Date   WBC 7.2 12/28/2022   HGB 15.4 12/28/2022   HCT 46.9 12/28/2022   PLT 267.0 12/28/2022   GLUCOSE 90 12/28/2022   CHOL 223 (H) 05/30/2022   TRIG 426.0 (H) 12/28/2022   HDL 22.80 (L) 05/30/2022   LDLDIRECT 37.0 05/30/2022   LDLCALC 53 05/11/2021   ALT 38 05/30/2022   AST 26 05/30/2022   NA 134 (L) 12/28/2022   K 5.1 12/28/2022   CL 101 12/28/2022   CREATININE 2.03 (H) 12/28/2022   BUN 32 (H) 12/28/2022   CO2 25 12/28/2022   TSH 2.43 05/30/2022   PSA 1.28 05/30/2022   HGBA1C 6.7 (H) 12/28/2022   MICROALBUR 7.2 (H) 12/28/2022    No results found.  Assessment & Plan:  Flu vaccine need -     Flu Vaccine Trivalent High Dose (Fluad)  Stage 3a chronic kidney disease (HCC)- His renal function has declined. -     Microalbumin / creatinine urine ratio; Future -     Basic metabolic panel; Future  Microalbuminuria -     Microalbumin / creatinine urine ratio; Future  Essential hypertension- His BP is well controlled. -     CBC with Differential/Platelet; Future  High triglycerides -     Triglycerides; Future  Gastroesophageal reflux disease without esophagitis -     CBC with Differential/Platelet; Future  Insulin-requiring or dependent type II diabetes mellitus (HCC) -     Microalbumin / creatinine urine ratio; Future -     Urinalysis, Routine w reflex microscopic; Future -     Hemoglobin A1c; Future -     Basic metabolic panel; Future  Chronic right shoulder pain -     Ambulatory  referral to Sports Medicine  Type 2 diabetes mellitus with stage 3b chronic kidney disease, without long-term current use of insulin (HCC) -     Empagliflozin; Take 1 tablet (25 mg total) by mouth daily before breakfast.  Dispense: 90 tablet; Refill: 1  Follow-up: Return in about 4 months (around 04/29/2023).  Sanda Linger, MD

## 2022-12-28 NOTE — Patient Instructions (Signed)

## 2022-12-29 NOTE — Progress Notes (Unsigned)
    Aleen Sells D.[facility] [address] [place] [address] Phone: [phone]   Assessment and Plan:     There are no diagnoses linked to this encounter.  ***   Pertinent previous records reviewed include ***   Follow Up: ***     Subjective:   I, Analyn Matusek, am serving as a Neurosurgeon for Doctor Richardean Sale  Chief Complaint: right shoulder pain   HPI:   01/01/2023 Patient is a 67 year old male complaining of right shoulder pain. Patient state  Relevant Historical Information: ***  Additional pertinent review of systems negative.   Current Outpatient Medications:    amLODipine (NORVASC) 10 MG tablet, Take 1 tablet by mouth once daily, Disp: 90 tablet, Rfl: 0   Ascorbic Acid (VITAMIN C) 1000 MG tablet, Take 1,000 mg by mouth at bedtime., Disp: , Rfl:    aspirin EC 81 MG tablet, Take 81 mg by mouth in the morning., Disp: , Rfl:    carvedilol (COREG) 25 MG tablet, Take 1 tablet by mouth twice daily, Disp: 180 tablet, Rfl: 0   Cholecalciferol (VITAMIN D3) 250 MCG (10000 UT) TABS, Take 10,000 Units by mouth at bedtime., Disp: , Rfl:    Continuous Blood Gluc Receiver (DEXCOM G7 RECEIVER) DEVI, 1 Act by Does not apply route daily., Disp: 9 each, Rfl: 1   Continuous Blood Gluc Sensor (DEXCOM G7 SENSOR) MISC, 1 Act by Does not apply route daily., Disp: 9 each, Rfl: 1   doxazosin (CARDURA) 2 MG tablet, Take 1 tablet by mouth once daily, Disp: 90 tablet, Rfl: 0   empagliflozin (JARDIANCE) 25 MG TABS tablet, Take 1 tablet (25 mg total) by mouth daily before breakfast., Disp: 90 tablet, Rfl: 1   Insulin Pen Needle 32G X 6 MM MISC, 1 Act by Does not apply route daily., Disp: 100 each, Rfl: 1   irbesartan (AVAPRO) 300 MG tablet, Take 1 tablet by mouth once daily, Disp: 90 tablet, Rfl: 2   Multiple Vitamin (MULTIVITAMIN WITH MINERALS) TABS tablet, Take 1 tablet by mouth in the morning., Disp: , Rfl:    omega-3 acid ethyl esters (LOVAZA) 1 g  capsule, Take 2 capsules by mouth twice daily, Disp: 120 capsule, Rfl: 0   omeprazole (PRILOSEC) 40 MG capsule, Take 1 capsule by mouth once daily, Disp: 90 capsule, Rfl: 0   rosuvastatin (CRESTOR) 10 MG tablet, TAKE 1 TABLET BY MOUTH ONCE DAILY . APPOINTMENT REQUIRED FOR FUTURE REFILLS, Disp: 90 tablet, Rfl: 0   spironolactone (ALDACTONE) 25 MG tablet, Take 1 tablet by mouth once daily, Disp: 90 tablet, Rfl: 0   TOUJEO MAX SOLOSTAR 300 UNIT/ML Solostar Pen, INJECT 30 UNITS SUBCUTANEOUSLY ONCE DAILY, Disp: 6 mL, Rfl: 0   Objective:     There were no vitals filed for this visit.    There is no height or weight on file to calculate BMI.    Physical Exam:    ***   Electronically signed by:  Aleen Sells D.[facility] 12:11 PM 12/29/22

## 2023-01-01 ENCOUNTER — Ambulatory Visit (INDEPENDENT_AMBULATORY_CARE_PROVIDER_SITE_OTHER): Payer: No Typology Code available for payment source | Admitting: Sports Medicine

## 2023-01-01 ENCOUNTER — Ambulatory Visit (INDEPENDENT_AMBULATORY_CARE_PROVIDER_SITE_OTHER): Payer: No Typology Code available for payment source

## 2023-01-01 VITALS — BP 122/80 | Ht 69.0 in | Wt 231.0 lb

## 2023-01-01 DIAGNOSIS — M47812 Spondylosis without myelopathy or radiculopathy, cervical region: Secondary | ICD-10-CM | POA: Diagnosis not present

## 2023-01-01 DIAGNOSIS — M25511 Pain in right shoulder: Secondary | ICD-10-CM

## 2023-01-01 DIAGNOSIS — M542 Cervicalgia: Secondary | ICD-10-CM

## 2023-01-01 DIAGNOSIS — M50322 Other cervical disc degeneration at C5-C6 level: Secondary | ICD-10-CM | POA: Diagnosis not present

## 2023-01-01 DIAGNOSIS — M19011 Primary osteoarthritis, right shoulder: Secondary | ICD-10-CM | POA: Diagnosis not present

## 2023-01-01 DIAGNOSIS — M5030 Other cervical disc degeneration, unspecified cervical region: Secondary | ICD-10-CM | POA: Diagnosis not present

## 2023-01-01 DIAGNOSIS — G8929 Other chronic pain: Secondary | ICD-10-CM | POA: Diagnosis not present

## 2023-01-01 DIAGNOSIS — I6523 Occlusion and stenosis of bilateral carotid arteries: Secondary | ICD-10-CM | POA: Diagnosis not present

## 2023-01-01 MED ORDER — MELOXICAM 15 MG PO TABS: 15.0000 mg | ORAL_TABLET | Freq: Every day | ORAL | 0 refills | Status: DC

## 2023-01-01 NOTE — Patient Instructions (Signed)
-   Start meloxicam 15 mg daily x2 weeks.  If still having pain after 2 weeks, complete 3rd-week of meloxicam. May use remaining meloxicam as needed once daily for pain control.  Do not to use additional NSAIDs while taking meloxicam.  May use Tylenol [id number] mg 2 to 3 times a day for breakthrough pain. Neck HEP As needed follow up

## 2023-01-08 ENCOUNTER — Other Ambulatory Visit: Payer: Self-pay | Admitting: Internal Medicine

## 2023-01-08 DIAGNOSIS — E781 Pure hyperglyceridemia: Secondary | ICD-10-CM

## 2023-01-08 MED ORDER — OMEGA-3-ACID ETHYL ESTERS 1 G PO CAPS: 2.0000 g | ORAL_CAPSULE | Freq: Two times a day (BID) | ORAL | 1 refills | Status: DC

## 2023-01-18 DIAGNOSIS — Z6834 Body mass index (BMI) 34.0-34.9, adult: Secondary | ICD-10-CM | POA: Diagnosis not present

## 2023-01-18 DIAGNOSIS — E669 Obesity, unspecified: Secondary | ICD-10-CM | POA: Diagnosis not present

## 2023-01-18 DIAGNOSIS — E1169 Type 2 diabetes mellitus with other specified complication: Secondary | ICD-10-CM | POA: Diagnosis not present

## 2023-01-18 DIAGNOSIS — N1832 Chronic kidney disease, stage 3b: Secondary | ICD-10-CM | POA: Diagnosis not present

## 2023-01-18 DIAGNOSIS — E785 Hyperlipidemia, unspecified: Secondary | ICD-10-CM | POA: Diagnosis not present

## 2023-01-18 DIAGNOSIS — E1122 Type 2 diabetes mellitus with diabetic chronic kidney disease: Secondary | ICD-10-CM | POA: Diagnosis not present

## 2023-01-18 DIAGNOSIS — F17210 Nicotine dependence, cigarettes, uncomplicated: Secondary | ICD-10-CM | POA: Diagnosis not present

## 2023-01-18 DIAGNOSIS — Z008 Encounter for other general examination: Secondary | ICD-10-CM | POA: Diagnosis not present

## 2023-01-29 ENCOUNTER — Other Ambulatory Visit: Payer: Self-pay | Admitting: Sports Medicine

## 2023-02-27 ENCOUNTER — Other Ambulatory Visit: Payer: Self-pay | Admitting: Internal Medicine

## 2023-02-27 DIAGNOSIS — E785 Hyperlipidemia, unspecified: Secondary | ICD-10-CM

## 2023-02-27 DIAGNOSIS — N400 Enlarged prostate without lower urinary tract symptoms: Secondary | ICD-10-CM

## 2023-02-27 DIAGNOSIS — I429 Cardiomyopathy, unspecified: Secondary | ICD-10-CM

## 2023-02-27 DIAGNOSIS — I119 Hypertensive heart disease without heart failure: Secondary | ICD-10-CM

## 2023-02-27 DIAGNOSIS — E876 Hypokalemia: Secondary | ICD-10-CM

## 2023-02-27 DIAGNOSIS — I10 Essential (primary) hypertension: Secondary | ICD-10-CM

## 2023-03-13 ENCOUNTER — Ambulatory Visit (INDEPENDENT_AMBULATORY_CARE_PROVIDER_SITE_OTHER): Payer: No Typology Code available for payment source

## 2023-03-13 VITALS — BP 108/69 | Ht 69.0 in | Wt 230.0 lb

## 2023-03-13 DIAGNOSIS — Z0000 Encounter for general adult medical examination without abnormal findings: Secondary | ICD-10-CM

## 2023-03-13 NOTE — Progress Notes (Signed)
Subjective:   Tim Decker is a 67 y.o. male who presents for Medicare Annual/Subsequent preventive examination.  Visit Complete: Virtual I connected with  [name] on 03/13/23 by a audio enabled telemedicine application and verified that I am speaking with the correct person using two identifiers.  Patient Location: Home  Provider Location: Office/Clinic  I discussed the limitations of evaluation and management by telemedicine. The patient expressed understanding and agreed to proceed.  Vital Signs: Because this visit was a virtual/telehealth visit, some criteria may be missing or patient reported. Any vitals not documented were not able to be obtained and vitals that have been documented are patient reported.   Cardiac Risk Factors include: advanced age (>67men, >8 women);male gender;hypertension;diabetes mellitus;Other (see comment), Risk factor comments: Cardiomyopathy/LHV, BPH, CKD     Objective:    Today's Vitals   03/13/23 0807  BP: 108/69  Weight: 230 lb (104.3 kg)  Height: 5\' 9"  (1.753 m)   Body mass index is 33.97 kg/m.     03/13/2023    9:12 AM 08/07/2022    9:53 AM 08/02/2022   10:50 AM 11/04/2021    1:17 PM 02/10/2019    9:14 AM  Advanced Directives  Does Patient Have a Medical Advance Directive? No No No No No  Would patient like information on creating a medical advance directive?  No - Patient declined No - Patient declined No - Patient declined No - Patient declined    Current Medications (verified) Outpatient Encounter Medications as of 03/13/2023  Medication Sig   amLODipine (NORVASC) 10 MG tablet Take 1 tablet by mouth once daily   Ascorbic Acid (VITAMIN C) 1000 MG tablet Take 1,000 mg by mouth at bedtime.   aspirin EC 81 MG tablet Take 81 mg by mouth in the morning.   carvedilol (COREG) 25 MG tablet Take 1 tablet by mouth twice daily   Cholecalciferol (VITAMIN D3) 250 MCG (10000 UT) TABS Take 10,000 Units by mouth at bedtime.    doxazosin (CARDURA) 2 MG tablet Take 1 tablet by mouth once daily   irbesartan (AVAPRO) 300 MG tablet Take 1 tablet by mouth once daily   meloxicam (MOBIC) 15 MG tablet Take 1 tablet (15 mg total) by mouth daily.   Multiple Vitamin (MULTIVITAMIN WITH MINERALS) TABS tablet Take 1 tablet by mouth in the morning.   omega-3 acid ethyl esters (LOVAZA) 1 g capsule Take 2 capsules (2 g total) by mouth 2 (two) times daily.   omeprazole (PRILOSEC) 40 MG capsule Take 1 capsule by mouth once daily   rosuvastatin (CRESTOR) 10 MG tablet TAKE 1 TABLET BY MOUTH ONCE DAILY. APPOINTMENT REQUIRED FOR FUTURE REFILLS   spironolactone (ALDACTONE) 25 MG tablet Take 1 tablet by mouth once daily   Continuous Blood Gluc Receiver (DEXCOM G7 RECEIVER) DEVI 1 Act by Does not apply route daily. (Patient not taking: Reported on 03/13/2023)   Continuous Blood Gluc Sensor (DEXCOM G7 SENSOR) MISC 1 Act by Does not apply route daily. (Patient not taking: Reported on 03/13/2023)   empagliflozin (JARDIANCE) 25 MG TABS tablet Take 1 tablet (25 mg total) by mouth daily before breakfast. (Patient not taking: Reported on 03/13/2023)   Insulin Pen Needle 32G X 6 MM MISC 1 Act by Does not apply route daily. (Patient not taking: Reported on 03/13/2023)   TOUJEO MAX SOLOSTAR 300 UNIT/ML Solostar Pen INJECT 30 UNITS SUBCUTANEOUSLY ONCE DAILY (Patient not taking: Reported on 03/13/2023)   No facility-administered encounter medications on file as of  03/13/2023.    Allergies (verified) Patient has no known allergies.   History: Past Medical History:  Diagnosis Date   GERD (gastroesophageal reflux disease)    Hyperlipidemia    Hypertension    Pre-diabetes    A1c 6%   Past Surgical History:  Procedure Laterality Date   colonoscopy with polypectomy  2009   Dr Leafy [facility]   COLONOSCOPY WITH PROPOFOL N/A 08/07/2022   Procedure: COLONOSCOPY WITH PROPOFOL;  Surgeon: Lanelle Bal, DO;  [facility];  Service:  Endoscopy;  Laterality: N/A;  11:30 AM   POLYPECTOMY  08/07/2022   Procedure: POLYPECTOMY;  Surgeon: Lanelle Bal, DO;  Location: [facility];  Service: Endoscopy;;   TONSILLECTOMY AND ADENOIDECTOMY     WISDOM TOOTH EXTRACTION     two extraction   Family History  Problem Relation Age of Onset   Heart attack Father 47   Diabetes Mother    Heart attack Paternal Grandfather        ? 65   Cancer Neg Hx    Stroke Neg Hx    Social History   Socioeconomic History   Marital status: Divorced    Spouse name: Not on file   Number of children: Not on file   Years of education: Not on file   Highest education level: Not on file  Occupational History   Occupation: Retired/works some  Tobacco Use   Smoking status: Every Day    Current packs/day: 0.50    Average packs/day: 0.5 packs/day for 41.0 years (20.5 ttl pk-yrs)    Types: Cigarettes    Passive exposure: Never   Smokeless tobacco: Never   Tobacco comments:    smoked since 1972 up to 1/2 ppd. Updated 10/24/2022. Amy marsh, cma  Vaping Use   Vaping status: Never Used  Substance and Sexual Activity   Alcohol use: Not Currently   Drug use: No   Sexual activity: Yes    Partners: Female  Other Topics Concern   Not on file  Social History Narrative   Lives with girlfriend and his mother   Social Determinants of Health   Financial Resource Strain: Low Risk  (03/13/2023)   Overall Financial Resource Strain (CARDIA)    Difficulty of Paying Living Expenses: Not very hard  Food Insecurity: No Food Insecurity (03/13/2023)   Hunger Vital Sign    Worried About Running Out of Food in the Last Year: Never true    Ran Out of Food in the Last Year: Never true  Transportation Needs: No Transportation Needs (03/13/2023)   PRAPARE - Administrator, Civil Service (Medical): No    Lack of Transportation (Non-Medical): No  Physical Activity: Sufficiently Active (03/13/2023)   Exercise Vital Sign    Days of Exercise per Week:  6 days    Minutes of Exercise per Session: 60 min  Stress: No Stress Concern Present (03/13/2023)   [employer] of Occupational Health - Occupational Stress Questionnaire    Feeling of Stress : Not at all  Social Connections: Moderately Isolated (03/13/2023)   Social Connection and Isolation Panel [NHANES]    Frequency of Communication with Friends and Family: Once a week    Frequency of Social Gatherings with Friends and Family: Never    Attends Religious Services: More than 4 times per year    Active Member of [employer] or Organizations: Yes    Attends Banker Meetings: More than 4 times per year    Marital Status:  Divorced    Tobacco Counseling Ready to quit: Not Answered Counseling given: Not Answered Tobacco comments: smoked since 1972 up to 1/2 ppd. Updated 10/24/2022. Amy marsh, cma   Clinical Intake:  Pre-visit preparation completed: Yes  Pain : No/denies pain     BMI - recorded: 33.97 Nutritional Status: BMI > 30  Obese Nutritional Risks: None Diabetes: Yes CBG done?: Yes (109) CBG resulted in Enter/ Edit results?: No Did pt. bring in CBG monitor from home?: No  How often do you need to have someone help you when you read instructions, pamphlets, or other written materials from your doctor or pharmacy?: 1 - Never  Interpreter Needed?: No  Information entered by :: Christan Defranco, RMA   Activities of Daily Living    03/13/2023    8:07 AM 08/02/2022   10:59 AM  In your present state of health, do you have any difficulty performing the following activities:  Hearing? 0   Vision? 0   Difficulty concentrating or making decisions? 0   Walking or climbing stairs? 0   Dressing or bathing? 0   Doing errands, shopping? 0 0  Preparing Food and eating ? N   Using the Toilet? N   In the past six months, have you accidently leaked urine? N   Do you have problems with loss of bowel control? N   Managing your Medications? N   Managing your Finances? N    Housekeeping or managing your Housekeeping? N     Patient Care Team: Etta Grandchild, MD as PCP - General (Internal Medicine) Jodelle Red, MD as PCP - Cardiology (Cardiology)  Indicate any recent Medical Services you may have received from other than [facility] providers in the past year (date may be approximate).     Assessment:   This is a routine wellness examination for Mon[place]rio.  Hearing/Vision screen Hearing Screening - Comments:: Denies hearing difficulties   Vision Screening - Comments:: Denies vision issues   Goals Addressed               This Visit's Progress     Patient Stated (pt-stated)        Not at this time      Depression Screen    03/13/2023    9:18 AM 12/28/2022    1:21 PM 11/04/2021    1:17 PM 11/04/2021    1:16 PM 11/24/2020    8:37 AM 09/25/2019    3:38 PM 07/04/2018   11:56 AM  PHQ 2/9 Scores  PHQ - 2 Score 0 0 0 0 0 0 0  PHQ- 9 Score 0 0         Fall Risk    03/13/2023    9:12 AM 12/28/2022    1:21 PM 11/04/2021    1:17 PM 11/24/2020    8:37 AM 09/25/2019    3:38 PM  Fall Risk   Falls in the past year? 0 0 0 0 0  Number falls in past yr: 0 0 0  0  Injury with Fall? 0 0 0  0  Risk for fall due to : No Fall Risks No Fall Risks No Fall Risks  No Fall Risks  Follow up Falls evaluation completed;Falls prevention discussed Falls evaluation completed Falls evaluation completed;Education provided  Falls evaluation completed    MEDICARE RISK AT HOME: Medicare Risk at Home Any stairs in or around the home?: Yes If so, are there any without handrails?: Yes Home free of loose throw rugs in walkways, pet  beds, electrical cords, etc?: Yes Adequate lighting in your home to reduce risk of falls?: Yes Life alert?: No Use of a cane, walker or w/c?: No Grab bars in the bathroom?: Yes Shower chair or bench in shower?: Yes Elevated toilet seat or a handicapped toilet?: Yes  TIMED UP AND GO:  Was the test performed?  No    Cognitive Function:         03/13/2023    8:09 AM  6CIT Screen  What Year? 0 points  What month? 0 points  What time? 0 points  Count back from 20 0 points  Months in reverse 4 points  Repeat phrase 0 points  Total Score 4 points    Immunizations Immunization History  Administered Date(s) Administered   Fluad Quad(high Dose 65+) 05/12/2020   Fluad Trivalent(High Dose 65+) 12/28/2022   Influenza Whole 02/03/2008   Influenza, High Dose Seasonal PF 03/21/2022   Influenza,inj,Quad PF,6+ Mos 05/10/2017, 03/03/2018, 02/12/2019   Influenza-Unspecified 02/17/2015   Moderna Sars-Covid-2 Vaccination 07/11/2019, 08/12/2019   Pneumococcal Conjugate-13 05/12/2020   Pneumococcal Polysaccharide-23 07/18/2018   Td 04/21/2010   Tdap 07/13/2021   Zoster Recombinant(Shingrix) 07/13/2021, 01/20/2022    TDAP status: Up to date  Flu Vaccine status: Up to date  Pneumococcal vaccine status: Up to date  Covid-19 vaccine status: Completed vaccines  Qualifies for Shingles Vaccine? Yes   Zostavax completed Yes   Shingrix Completed?: Yes  Screening Tests Health Maintenance  Topic Date Due   COVID-19 Vaccine (3 - 2023-24 season) 03/29/2023 (Originally 12/17/2022)   OPHTHALMOLOGY EXAM  06/18/2023 (Originally 03/07/2023)   FOOT EXAM  05/31/2023   HEMOGLOBIN A1C  06/27/2023   Pneumonia Vaccine 68+ Years old (3 of 3 - PPSV23 or PCV20) 07/18/2023   Lung Cancer Screening  10/25/2023   Diabetic kidney evaluation - eGFR measurement  12/28/2023   Diabetic kidney evaluation - Urine ACR  12/28/2023   Medicare Annual Wellness (AWV)  03/12/2024   Fecal DNA (Cologuard)  08/08/2025   INFLUENZA VACCINE  Completed   Hepatitis C Screening  Completed   Zoster Vaccines- Shingrix  Completed   HPV VACCINES  Aged Out   DTaP/Tdap/Td  Discontinued    Health Maintenance  There are no preventive care reminders to display for this patient.   Colorectal cancer screening: Type of screening: Colonoscopy. Completed 08/07/2022. Repeat  every 3 years  Lung Cancer Screening: (Low Dose CT Chest recommended if Age 80-80 years, 20 pack-year currently smoking OR have quit w/in 15years.) does qualify.   Lung Cancer Screening Referral: 10/25/2022  Additional Screening:  Hepatitis C Screening: does qualify; Completed 07/28/2015  Vision Screening: Recommended annual ophthalmology exams for early detection of glaucoma and other disorders of the eye. Is the patient up to date with their annual eye exam?  No  Who is the provider or what is the name of the office in which the patient attends annual eye exams? N/A If pt is not established with a provider, would they like to be referred to a provider to establish care? Yes .   Dental Screening: Recommended annual dental exams for proper oral hygiene  Diabetic Foot Exam: Diabetic Foot Exam: Completed 05/30/2022  Community Resource Referral / Chronic Care Management: CRR required this visit?  No   CCM required this visit?  No     Plan:     I have personally reviewed and noted the following in the patient's chart:   Medical and social history Use of alcohol, tobacco or illicit drugs  Current medications and supplements including opioid prescriptions. Patient is not currently taking opioid prescriptions. Functional ability and status Nutritional status Physical activity Advanced directives List of other physicians Hospitalizations, surgeries, and ER visits in previous 12 months Vitals Screenings to include cognitive, depression, and falls Referrals and appointments  In addition, I have reviewed and discussed with patient certain preventive protocols, quality metrics, and best practice recommendations. A written personalized care plan for preventive services as well as general preventive health recommendations were provided to patient.     Grey Rakestraw L Coron Rossano, CMA   03/13/2023   After Visit Summary: (MyChart) Due to this being a telephonic visit, the after visit summary with  patients personalized plan was offered to patient via MyChart   Nurse Notes: Patient is due for a yearly eye exam and would like a recommendation for an eye doctor closer to where he lives in [place].  He has a 6 month follow up appointment coming in March with Dr. Yetta Barre.  Patient had no other concerns to address today.

## 2023-03-13 NOTE — Patient Instructions (Signed)
Tim Decker , Thank you for taking time to come for your Medicare Wellness Visit. I appreciate your ongoing commitment to your health goals. Please review the following plan we discussed and let me know if I can assist you in the future.   Referrals/Orders/Follow-Ups/Clinician Recommendations: You are due for a yearly eye exam.  Remember to ask Dr. Yetta Barre for a recommendation for one in your area during your next office visit with him in March.  It was nice talking to you today and keep up the good work.   This is a list of the screening recommended for you and due dates:  Health Maintenance  Topic Date Due   COVID-19 Vaccine (3 - 2023-24 season) 03/29/2023*   Eye exam for diabetics  06/18/2023*   Complete foot exam   05/31/2023   Hemoglobin A1C  06/27/2023   Pneumonia Vaccine (3 of 3 - PPSV23 or PCV20) 07/18/2023   Screening for Lung Cancer  10/25/2023   Yearly kidney function blood test for diabetes  12/28/2023   Yearly kidney health urinalysis for diabetes  12/28/2023   Medicare Annual Wellness Visit  03/12/2024   Cologuard (Stool DNA test)  08/08/2025   Flu Shot  Completed   Hepatitis C Screening  Completed   Zoster (Shingles) Vaccine  Completed   HPV Vaccine  Aged Out   DTaP/Tdap/Td vaccine  Discontinued  *Topic was postponed. The date shown is not the original due date.    Advanced directives: (Declined) Advance directive discussed with you today. Even though you declined this today, please call our office should you change your mind, and we can give you the proper paperwork for you to fill out.  Next Medicare Annual Wellness Visit scheduled for next year: Yes

## 2023-03-28 ENCOUNTER — Encounter (HOSPITAL_BASED_OUTPATIENT_CLINIC_OR_DEPARTMENT_OTHER): Payer: Self-pay | Admitting: Cardiology

## 2023-03-28 ENCOUNTER — Ambulatory Visit (INDEPENDENT_AMBULATORY_CARE_PROVIDER_SITE_OTHER): Payer: No Typology Code available for payment source | Admitting: Cardiology

## 2023-03-28 VITALS — BP 110/68 | HR 62 | Ht 69.0 in | Wt 236.0 lb

## 2023-03-28 DIAGNOSIS — N1832 Chronic kidney disease, stage 3b: Secondary | ICD-10-CM

## 2023-03-28 DIAGNOSIS — F17210 Nicotine dependence, cigarettes, uncomplicated: Secondary | ICD-10-CM

## 2023-03-28 DIAGNOSIS — E1121 Type 2 diabetes mellitus with diabetic nephropathy: Secondary | ICD-10-CM | POA: Diagnosis not present

## 2023-03-28 DIAGNOSIS — E782 Mixed hyperlipidemia: Secondary | ICD-10-CM

## 2023-03-28 DIAGNOSIS — Z716 Tobacco abuse counseling: Secondary | ICD-10-CM

## 2023-03-28 DIAGNOSIS — I119 Hypertensive heart disease without heart failure: Secondary | ICD-10-CM | POA: Diagnosis not present

## 2023-03-28 DIAGNOSIS — Z720 Tobacco use: Secondary | ICD-10-CM

## 2023-03-28 DIAGNOSIS — I10 Essential (primary) hypertension: Secondary | ICD-10-CM

## 2023-03-28 DIAGNOSIS — I428 Other cardiomyopathies: Secondary | ICD-10-CM

## 2023-03-28 NOTE — Progress Notes (Signed)
Cardiology Office Note:  .   Date:  03/28/2023  ID:  Tim Decker, DOB 11/25/1955, MRN [id number] PCP: Etta Grandchild, MD  [facility] Providers Cardiologist:  Jodelle Red, MD {  History of Present Illness: .   Tim Decker is a 67 y.o. male with a hx of cardiomyopathy, hyperlipidemia, hypertension, and pre-diabetes, who is seen for follow-up. I initially met him 02/08/2021 as a new consult at the request of Etta Grandchild, MD for the evaluation and management of abnormal electrocardiogram results.    CV history: echo 2020 with EF 35-40%. Severe LVH. Myoview 2020 EF 44%, mild diffuse hypokinesis. Small fixed mild defect in apical area, no ischemia. LVEF normalized to 55-60% 05/2021. We have discussed Entresto which he declined due to cost.   Fhx: his father died of a myocardial infarction. Smoking is heavy in his family. His sister smokes, and has suffered a stroke and had a CABG procedure.  Today: Staying active, lifts/walks throughout the day. Has some left arm tingling when driving, goes away when he changes his arm position.  He notes that he is not taking any of his diabetes medications. Has a CGM on, current sugar 132. Overnight had a sugar of 49. Most sugars are 70s-80s. Initially saw endocrinologist but they don't take his insurance. Dr. Yetta Barre is aware that he is not taking Jardiance, Ozempic, or insulin.   Still smoking 1 ppd, has thought about quitting but mostly precontemplative.   ROS: Denies chest pain, shortness of breath at rest or with normal exertion. No PND, orthopnea, LE edema or unexpected weight gain. No syncope or palpitations. ROS otherwise negative except as noted.   Studies Reviewed: Marland Kitchen    EKG:  EKG Interpretation Date/Time:  Wednesday March 28 2023 08:51:35 EST Ventricular Rate:  62 PR Interval:  166 QRS Duration:  102 QT Interval:  380 QTC Calculation: 385 R Axis:   -76  Text Interpretation: Normal sinus rhythm Left axis  deviation Incomplete right bundle branch block Confirmed by Jodelle Red [phone]) on 03/28/2023 9:24:40 AM    Physical Exam:   VS:  BP 110/68   Pulse 62   Ht 5\' 9"  (1.753 m)   Wt 236 lb (107 kg)   SpO2 98%   BMI 34.85 kg/m    Wt Readings from Last 3 Encounters:  03/28/23 236 lb (107 kg)  03/13/23 230 lb (104.3 kg)  01/01/23 231 lb (104.8 kg)    GEN: Well nourished, well developed in no acute distress HEENT: Normal, moist mucous membranes NECK: No JVD CARDIAC: regular rhythm, normal S1 and S2, no rubs or gallops. No murmur. VASCULAR: Radial and DP pulses 2+ bilaterally. No carotid bruits RESPIRATORY:  Clear to auscultation without rales, wheezing or rhonchi  ABDOMEN: Soft, non-tender, non-distended MUSCULOSKELETAL:  Ambulates independently SKIN: Warm and dry, no edema NEUROLOGIC:  Alert and oriented x 3. No focal neuro deficits noted. PSYCHIATRIC:  Normal affect    ASSESSMENT AND PLAN: .    Cardiomyopathy, suspect nonischemic due to stress test, likely 2/2 hypertensive heart disease Severe LVH Chronic kidney disease, most recent stage 3b (GFR 40)  -NYHA class I -EF 35-40% in 02/2019-->normalized to 55-60% 05/2021 -had myoview at that time without ischemia, has never had cath -on carvedilol -on spironolactone, irbesartan -was on SGLT2i, no longer taking -we discussed entresto, declined due to cost   Hypertension: -well controlled today -on carvedilol, amlodipine, spironolactone, irbesartan, doxazosin -checks home BP, contacting me if consistently >130/80   Mixed hyperlipidemia -  continue rosuvastatin -no longer taking lovaza 2/2 cost   Type II diabetes, with proteinuria Obesity -on aspirin, rosuvastatin -no longer on dapagliflozin -now on continuous glucose monitor, sugars well controlled, not taking insulin   Tobacco abuse: The patient was counseled on tobacco cessation today for 4 minutes.  Counseling included reviewing the risks of smoking tobacco  products, how it impacts the patient's current medical diagnoses and different strategies for quitting.  Pharmacotherapy to aid in tobacco cessation was not prescribed today.   CV risk counseling and prevention -recommend heart healthy/Mediterranean diet, with whole grains, fruits, vegetable, fish, lean meats, nuts, and olive oil. Limit salt. -recommend moderate walking, 3-5 times/week for 30-50 minutes each session. Aim for at least 150 minutes.week. Goal should be pace of 3 miles/hours, or walking 1.5 miles in 30 minutes -recommend avoidance of tobacco products. Avoid excess alcohol. -ASCVD risk score: The 10-year ASCVD risk score (Arnett DK, et al., 2019) is: 46.4%   Values used to calculate the score:     Age: 47 years     Sex: Male     Is Non-Hispanic African American: No     Diabetic: Yes     Tobacco smoker: Yes     Systolic Blood Pressure: 110 mmHg     Is BP treated: Yes     HDL Cholesterol: 22.8 mg/dL     Total Cholesterol: 223 mg/dL    Dispo: 6 mos or sooner as needed  Signed, Jodelle Red, MD   Jodelle Red, MD, PhD, [facility] [place]  [facility]    Heart & Vascular at [facility] at [facility] [address], [address] [place], [place] [address] [phone]

## 2023-03-28 NOTE — Patient Instructions (Signed)

## 2023-03-31 ENCOUNTER — Encounter: Payer: Self-pay | Admitting: Sports Medicine

## 2023-04-02 NOTE — Progress Notes (Signed)
[facility] [address] [place] [address] Phone: [phone]   Assessment and Plan:     1. Right foot pain 2. Plantar fasciitis of right foot  -Acute, uncomplicated, initial visit - Most consistent with plantar fasciitis of right foot, likely flared due to physical activity and working around his house - Start HEP for plan fasciitis - Start meloxicam 15 mg daily x2 weeks.  If still having pain after 2 weeks, complete 3rd-week of NSAID. May use remaining NSAID as needed once daily for pain control.  Do not to use additional over-the-counter NSAIDs (ibuprofen, naproxen, Advil, Aleve) while taking prescription NSAIDs.  May use Tylenol [phone] mg 2 to 3 times a day for breakthrough pain. - X-rays obtained in clinic.  My interpretation: No acute fracture or dislocation.  Enthesophytes at anterior inferior calcaneus and posterior calcaneus.  Midfoot arthritis.  First MTP arthritis  15 additional minutes spent for educating Therapeutic Home Exercise Program.  This included exercises focusing on stretching, strengthening, with focus on eccentric aspects.   Long term goals include an improvement in range of motion, strength, endurance as well as avoiding reinjury. Patient's frequency would include in 1-2 times a day, 3-5 times a week for a duration of 6-12 weeks. Proper technique shown and discussed handout in great detail with ATC.  All questions were discussed and answered.    Pertinent previous records reviewed include none    Follow Up: As needed if no improvement in 4 weeks.  Could consider plantar fascia CSI   Subjective:   I, Tim Decker, am serving as a Neurosurgeon for Doctor Richardean Sale  Chief Complaint: right foot pain   HPI:  04/03/2023 Patient is a 67 year old male with right foot pain. Patient states that he has roughly a week and a half of pain. Pain is located to the bottom of his foot. No MOI he was loading hay and  doesn't really remember. Aleve has helped with the pain.    Relevant Historical Information:   Hypertension, DM type II, CKD  Additional pertinent review of systems negative.   Current Outpatient Medications:    amLODipine (NORVASC) 10 MG tablet, Take 1 tablet by mouth once daily, Disp: 90 tablet, Rfl: 0   Ascorbic Acid (VITAMIN C) 1000 MG tablet, Take 1,000 mg by mouth at bedtime., Disp: , Rfl:    aspirin EC 81 MG tablet, Take 81 mg by mouth in the morning., Disp: , Rfl:    carvedilol (COREG) 25 MG tablet, Take 1 tablet by mouth twice daily, Disp: 180 tablet, Rfl: 0   Cholecalciferol (VITAMIN D3) 250 MCG (10000 UT) TABS, Take 10,000 Units by mouth at bedtime., Disp: , Rfl:    Continuous Blood Gluc Receiver (DEXCOM G7 RECEIVER) DEVI, 1 Act by Does not apply route daily., Disp: 9 each, Rfl: 1   Continuous Blood Gluc Sensor (DEXCOM G7 SENSOR) MISC, 1 Act by Does not apply route daily., Disp: 9 each, Rfl: 1   doxazosin (CARDURA) 2 MG tablet, Take 1 tablet by mouth once daily, Disp: 90 tablet, Rfl: 0   empagliflozin (JARDIANCE) 25 MG TABS tablet, Take 1 tablet (25 mg total) by mouth daily before breakfast., Disp: 90 tablet, Rfl: 1   Insulin Pen Needle 32G X 6 MM MISC, 1 Act by Does not apply route daily., Disp: 100 each, Rfl: 1   irbesartan (AVAPRO) 300 MG tablet, Take 1 tablet by mouth once daily, Disp: 90 tablet, Rfl: 2  meloxicam (MOBIC) 15 MG tablet, Take 1 tablet (15 mg total) by mouth daily., Disp: 30 tablet, Rfl: 0   meloxicam (MOBIC) 15 MG tablet, Take 1 tablet (15 mg total) by mouth daily., Disp: 30 tablet, Rfl: 0   Multiple Vitamin (MULTIVITAMIN WITH MINERALS) TABS tablet, Take 1 tablet by mouth in the morning., Disp: , Rfl:    omega-3 acid ethyl esters (LOVAZA) 1 g capsule, Take 2 capsules (2 g total) by mouth 2 (two) times daily., Disp: 360 capsule, Rfl: 1   omeprazole (PRILOSEC) 40 MG capsule, Take 1 capsule by mouth once daily, Disp: 90 capsule, Rfl: 0   rosuvastatin (CRESTOR) 10  MG tablet, TAKE 1 TABLET BY MOUTH ONCE DAILY. APPOINTMENT REQUIRED FOR FUTURE REFILLS, Disp: 90 tablet, Rfl: 0   spironolactone (ALDACTONE) 25 MG tablet, Take 1 tablet by mouth once daily, Disp: 90 tablet, Rfl: 0   TOUJEO MAX SOLOSTAR 300 UNIT/ML Solostar Pen, INJECT 30 UNITS SUBCUTANEOUSLY ONCE DAILY, Disp: 6 mL, Rfl: 0   Objective:     Vitals:   04/03/23 1427  Pulse: 66  SpO2: 97%  Weight: 237 lb (107.5 kg)  Height: 5\' 9"  (1.753 m)      Body mass index is 35 kg/m.    Physical Exam:    Gen: Appears well, nad, nontoxic and pleasant Psych: Alert and oriented, appropriate mood and affect Neuro: sensation intact, strength is 5/5 with df/pf/inv/ev, muscle tone wnl Skin: no susupicious lesions or rashes  Right foot/ankle:  No deformity, no swelling or effusion TTP mildly medial and middle plantar fascia NTTP over fibular head, lat mal, medial mal, achilles, navicular, base of 5th, ATFL, CFL, deltoid, calcaneous or midfoot ROM DF 30, PF 45, inv/ev intact Negative ant drawer, talar tilt, rotation test, squeeze test. Neg thompson No pain with resisted inversion or eversion    Electronically signed by:  Tim Decker [facility] 2:53 PM 04/03/23

## 2023-04-03 ENCOUNTER — Ambulatory Visit (INDEPENDENT_AMBULATORY_CARE_PROVIDER_SITE_OTHER): Payer: No Typology Code available for payment source

## 2023-04-03 ENCOUNTER — Ambulatory Visit (INDEPENDENT_AMBULATORY_CARE_PROVIDER_SITE_OTHER): Payer: No Typology Code available for payment source | Admitting: Sports Medicine

## 2023-04-03 VITALS — HR 66 | Ht 69.0 in | Wt 237.0 lb

## 2023-04-03 DIAGNOSIS — M19071 Primary osteoarthritis, right ankle and foot: Secondary | ICD-10-CM | POA: Diagnosis not present

## 2023-04-03 DIAGNOSIS — M722 Plantar fascial fibromatosis: Secondary | ICD-10-CM

## 2023-04-03 DIAGNOSIS — M79671 Pain in right foot: Secondary | ICD-10-CM

## 2023-04-03 MED ORDER — MELOXICAM 15 MG PO TABS: 15.0000 mg | ORAL_TABLET | Freq: Every day | ORAL | 0 refills | Status: DC

## 2023-04-03 NOTE — Patient Instructions (Signed)
-   Start meloxicam 15 mg daily x2 weeks.  If still having pain after 2 weeks, complete 3rd-week of NSAID. May use remaining NSAID as needed once daily for pain control.  Do not to use additional over-the-counter NSAIDs (ibuprofen, naproxen, Advil, Aleve) while taking prescription NSAIDs.  May use Tylenol [phone] mg 2 to 3 times a day for breakthrough pain. Foot HEP  As needed follow up, if no improvement 4-6 week follow up

## 2023-04-26 DIAGNOSIS — E785 Hyperlipidemia, unspecified: Secondary | ICD-10-CM | POA: Diagnosis not present

## 2023-04-26 DIAGNOSIS — Z008 Encounter for other general examination: Secondary | ICD-10-CM | POA: Diagnosis not present

## 2023-04-26 DIAGNOSIS — E1169 Type 2 diabetes mellitus with other specified complication: Secondary | ICD-10-CM | POA: Diagnosis not present

## 2023-05-09 ENCOUNTER — Other Ambulatory Visit: Payer: Self-pay | Admitting: Internal Medicine

## 2023-05-09 ENCOUNTER — Telehealth: Payer: Self-pay | Admitting: Internal Medicine

## 2023-05-09 DIAGNOSIS — E119 Type 2 diabetes mellitus without complications: Secondary | ICD-10-CM

## 2023-05-09 MED ORDER — DEXCOM G7 SENSOR MISC: 1.0000 | Freq: Every day | 1 refills | Status: DC

## 2023-05-09 MED ORDER — DEXCOM G7 RECEIVER DEVI: 1.0000 | Freq: Every day | 1 refills | Status: DC

## 2023-05-09 NOTE — Telephone Encounter (Signed)
 Copied from CRM [id number]. Topic: Clinical - Prescription Issue >> May 09, 2023  9:16 AM Kathryne Eriksson wrote: Reason for CRM: [facility] >> May 09, 2023  9:19 AM Kathryne Eriksson wrote: Patient calling in on behalf of not receiving his insulin machine, wanting an updated status as far as when he could expect it.

## 2023-05-31 DIAGNOSIS — E1140 Type 2 diabetes mellitus with diabetic neuropathy, unspecified: Secondary | ICD-10-CM | POA: Diagnosis not present

## 2023-05-31 DIAGNOSIS — E1122 Type 2 diabetes mellitus with diabetic chronic kidney disease: Secondary | ICD-10-CM | POA: Diagnosis not present

## 2023-05-31 DIAGNOSIS — D519 Vitamin B12 deficiency anemia, unspecified: Secondary | ICD-10-CM | POA: Diagnosis not present

## 2023-05-31 DIAGNOSIS — E559 Vitamin D deficiency, unspecified: Secondary | ICD-10-CM | POA: Diagnosis not present

## 2023-06-01 LAB — COMPREHENSIVE METABOLIC PANEL
Calcium: 10.4 (ref 8.7–10.7)
eGFR: 42

## 2023-06-01 LAB — LIPID PANEL
Cholesterol: 170 (ref 0–200)
HDL: 24 — AB (ref 35–70)
LDL Cholesterol: 60
Triglycerides: 569 — AB (ref 40–160)

## 2023-06-01 LAB — CBC AND DIFFERENTIAL
HCT: 46 (ref 41–53)
Hemoglobin: 15.3 (ref 13.5–17.5)
Platelets: 288 10*3/uL (ref 150–400)
WBC: 7

## 2023-06-01 LAB — HEPATIC FUNCTION PANEL
ALT: 30 U/L (ref 10–40)
AST: 19 (ref 14–40)
Alkaline Phosphatase: 46 (ref 25–125)
Bilirubin, Total: 0.6

## 2023-06-01 LAB — BASIC METABOLIC PANEL
BUN: 27 — AB (ref 4–21)
CO2: 21 (ref 13–22)
Chloride: 99 (ref 99–108)
Creatinine: 1.7 — AB (ref 0.6–1.3)
Glucose: 126
Potassium: 4.9 meq/L (ref 3.5–5.1)
Sodium: 137 (ref 137–147)

## 2023-06-01 LAB — LAB REPORT - SCANNED
A1c: 7.5
EGFR: 42

## 2023-06-01 LAB — VITAMIN D 25 HYDROXY (VIT D DEFICIENCY, FRACTURES): Vit D, 25-Hydroxy: 41

## 2023-06-01 LAB — HEMOGLOBIN A1C: Hemoglobin A1C: 7.5

## 2023-06-06 ENCOUNTER — Other Ambulatory Visit: Payer: Self-pay | Admitting: Sports Medicine

## 2023-06-06 ENCOUNTER — Other Ambulatory Visit: Payer: Self-pay | Admitting: Internal Medicine

## 2023-06-06 DIAGNOSIS — E876 Hypokalemia: Secondary | ICD-10-CM

## 2023-06-06 DIAGNOSIS — E785 Hyperlipidemia, unspecified: Secondary | ICD-10-CM

## 2023-06-06 DIAGNOSIS — I119 Hypertensive heart disease without heart failure: Secondary | ICD-10-CM

## 2023-06-06 DIAGNOSIS — I10 Essential (primary) hypertension: Secondary | ICD-10-CM

## 2023-06-06 DIAGNOSIS — N400 Enlarged prostate without lower urinary tract symptoms: Secondary | ICD-10-CM

## 2023-06-06 DIAGNOSIS — I429 Cardiomyopathy, unspecified: Secondary | ICD-10-CM

## 2023-06-08 DIAGNOSIS — E785 Hyperlipidemia, unspecified: Secondary | ICD-10-CM | POA: Diagnosis not present

## 2023-06-08 DIAGNOSIS — E1169 Type 2 diabetes mellitus with other specified complication: Secondary | ICD-10-CM | POA: Diagnosis not present

## 2023-06-08 DIAGNOSIS — E1122 Type 2 diabetes mellitus with diabetic chronic kidney disease: Secondary | ICD-10-CM | POA: Diagnosis not present

## 2023-06-08 DIAGNOSIS — N1832 Chronic kidney disease, stage 3b: Secondary | ICD-10-CM | POA: Diagnosis not present

## 2023-06-08 DIAGNOSIS — Z008 Encounter for other general examination: Secondary | ICD-10-CM | POA: Diagnosis not present

## 2023-06-18 ENCOUNTER — Ambulatory Visit: Payer: No Typology Code available for payment source | Admitting: Internal Medicine

## 2023-06-18 ENCOUNTER — Encounter: Payer: Self-pay | Admitting: Internal Medicine

## 2023-06-18 VITALS — BP 124/78 | HR 65 | Temp 97.7°F | Ht 69.0 in | Wt 236.8 lb

## 2023-06-18 DIAGNOSIS — E119 Type 2 diabetes mellitus without complications: Secondary | ICD-10-CM | POA: Diagnosis not present

## 2023-06-18 DIAGNOSIS — E781 Pure hyperglyceridemia: Secondary | ICD-10-CM

## 2023-06-18 DIAGNOSIS — Z794 Long term (current) use of insulin: Secondary | ICD-10-CM | POA: Diagnosis not present

## 2023-06-18 DIAGNOSIS — N400 Enlarged prostate without lower urinary tract symptoms: Secondary | ICD-10-CM | POA: Diagnosis not present

## 2023-06-18 DIAGNOSIS — N1831 Chronic kidney disease, stage 3a: Secondary | ICD-10-CM | POA: Diagnosis not present

## 2023-06-18 DIAGNOSIS — R0609 Other forms of dyspnea: Secondary | ICD-10-CM | POA: Diagnosis not present

## 2023-06-18 DIAGNOSIS — E1169 Type 2 diabetes mellitus with other specified complication: Secondary | ICD-10-CM

## 2023-06-18 DIAGNOSIS — Z0000 Encounter for general adult medical examination without abnormal findings: Secondary | ICD-10-CM | POA: Diagnosis not present

## 2023-06-18 DIAGNOSIS — Z0001 Encounter for general adult medical examination with abnormal findings: Secondary | ICD-10-CM

## 2023-06-18 DIAGNOSIS — I10 Essential (primary) hypertension: Secondary | ICD-10-CM | POA: Diagnosis not present

## 2023-06-18 DIAGNOSIS — N1832 Chronic kidney disease, stage 3b: Secondary | ICD-10-CM

## 2023-06-18 LAB — TSH: TSH: 2.85 u[IU]/mL (ref 0.35–5.50)

## 2023-06-18 LAB — PSA: PSA: 1.59 ng/mL (ref 0.10–4.00)

## 2023-06-18 LAB — BRAIN NATRIURETIC PEPTIDE: Pro B Natriuretic peptide (BNP): 44 pg/mL (ref 0.0–100.0)

## 2023-06-18 LAB — TROPONIN I (HIGH SENSITIVITY): High Sens Troponin I: 6 ng/L (ref 2–17)

## 2023-06-18 MED ORDER — OMEGA-3-ACID ETHYL ESTERS 1 G PO CAPS: 2.0000 g | ORAL_CAPSULE | Freq: Two times a day (BID) | ORAL | 1 refills | Status: DC

## 2023-06-18 MED ORDER — EMPAGLIFLOZIN 25 MG PO TABS: 25.0000 mg | ORAL_TABLET | Freq: Every day | ORAL | 1 refills | Status: DC

## 2023-06-18 MED ORDER — OZEMPIC (0.25 OR 0.5 MG/DOSE) 2 MG/3ML ~~LOC~~ SOPN: 0.2500 mg | PEN_INJECTOR | SUBCUTANEOUS | 0 refills | Status: DC

## 2023-06-18 MED ORDER — TOUJEO MAX SOLOSTAR 300 UNIT/ML ~~LOC~~ SOPN: 30.0000 [IU] | PEN_INJECTOR | Freq: Every day | SUBCUTANEOUS | 0 refills | Status: DC

## 2023-06-18 NOTE — Patient Instructions (Signed)
 Health Maintenance, Male  Adopting a healthy lifestyle and getting preventive care are important in promoting health and wellness. Ask your health care provider about:  The right schedule for you to have regular tests and exams.  Things you can do on your own to prevent diseases and keep yourself healthy.  What should I know about diet, weight, and exercise?  Eat a healthy diet    Eat a diet that includes plenty of vegetables, fruits, low-fat dairy products, and lean protein.  Do not eat a lot of foods that are high in solid fats, added sugars, or sodium.  Maintain a healthy weight  Body mass index (BMI) is a measurement that can be used to identify possible weight problems. It estimates body fat based on height and weight. Your health care provider can help determine your BMI and help you achieve or maintain a healthy weight.  Get regular exercise  Get regular exercise. This is one of the most important things you can do for your health. Most adults should:  Exercise for at least 150 minutes each week. The exercise should increase your heart rate and make you sweat (moderate-intensity exercise).  Do strengthening exercises at least twice a week. This is in addition to the moderate-intensity exercise.  Spend less time sitting. Even light physical activity can be beneficial.  Watch cholesterol and blood lipids  Have your blood tested for lipids and cholesterol at 69 years of age, then have this test every 5 years.  You may need to have your cholesterol levels checked more often if:  Your lipid or cholesterol levels are high.  You are older than 69 years of age.  You are at high risk for heart disease.  What should I know about cancer screening?  Many types of cancers can be detected early and may often be prevented. Depending on your health history and family history, you may need to have cancer screening at various ages. This may include screening for:  Colorectal cancer.  Prostate cancer.  Skin cancer.  Lung  cancer.  What should I know about heart disease, diabetes, and high blood pressure?  Blood pressure and heart disease  High blood pressure causes heart disease and increases the risk of stroke. This is more likely to develop in people who have high blood pressure readings or are overweight.  Talk with your health care provider about your target blood pressure readings.  Have your blood pressure checked:  Every 3-5 years if you are 9-95 years of age.  Every year if you are 85 years old or older.  If you are between the ages of 29 and 29 and are a current or former smoker, ask your health care provider if you should have a one-time screening for abdominal aortic aneurysm (AAA).  Diabetes  Have regular diabetes screenings. This checks your fasting blood sugar level. Have the screening done:  Once every three years after age 23 if you are at a normal weight and have a low risk for diabetes.  More often and at a younger age if you are overweight or have a high risk for diabetes.  What should I know about preventing infection?  Hepatitis B  If you have a higher risk for hepatitis B, you should be screened for this virus. Talk with your health care provider to find out if you are at risk for hepatitis B infection.  Hepatitis C  Blood testing is recommended for:  Everyone born from [birth year] through 1966.  Anyone  with known risk factors for hepatitis C.  Sexually transmitted infections (STIs)  You should be screened each year for STIs, including gonorrhea and chlamydia, if:  You are sexually active and are younger than 69 years of age.  You are older than 69 years of age and your health care provider tells you that you are at risk for this type of infection.  Your sexual activity has changed since you were last screened, and you are at increased risk for chlamydia or gonorrhea. Ask your health care provider if you are at risk.  Ask your health care provider about whether you are at high risk for HIV. Your health care provider  may recommend a prescription medicine to help prevent HIV infection. If you choose to take medicine to prevent HIV, you should first get tested for HIV. You should then be tested every 3 months for as long as you are taking the medicine.  Follow these instructions at home:  Alcohol use  Do not drink alcohol if your health care provider tells you not to drink.  If you drink alcohol:  Limit how much you have to 0-2 drinks a day.  Know how much alcohol is in your drink. In the [place] one drink equals one 12 oz bottle of beer (355 mL), one 5 oz glass of wine (148 mL), or one 1 oz glass of hard liquor (44 mL).  Lifestyle  Do not use any products that contain nicotine or tobacco. These products include cigarettes, chewing tobacco, and vaping devices, such as e-cigarettes. If you need help quitting, ask your health care provider.  Do not use street drugs.  Do not share needles.  Ask your health care provider for help if you need support or information about quitting drugs.  General instructions  Schedule regular health, dental, and eye exams.  Stay current with your vaccines.  Tell your health care provider if:  You often feel depressed.  You have ever been abused or do not feel safe at home.  Summary  Adopting a healthy lifestyle and getting preventive care are important in promoting health and wellness.  Follow your health care provider's instructions about healthy diet, exercising, and getting tested or screened for diseases.  Follow your health care provider's instructions on monitoring your cholesterol and blood pressure.  This information is not intended to replace advice given to you by your health care provider. Make sure you discuss any questions you have with your health care provider.  Document Revised: 08/23/2020 Document Reviewed: 08/23/2020  Elsevier Patient Education  2024 [employer].

## 2023-06-18 NOTE — Progress Notes (Unsigned)
 Subjective:  Patient ID: Tim Decker, male    DOB: Jul 13, 1955  Age: 68 y.o. MRN: [id number]  CC: Annual Exam, Diabetes, Hyperlipidemia, and Hypertension   HPI Tim Decker presents for a CPX and f/up ----  Discussed the use of AI scribe software for clinical note transcription with the patient, who gave verbal consent to proceed.  History of Present Illness   Tim Decker is a 68 year old male who presents for a routine physical exam and evaluation of occasional shortness of breath.  He experiences occasional shortness of breath, particularly during overexertion with activities such as yard work or farming. These episodes are infrequent and not severe, occurring only with overexertion. No associated chest pain, dizziness, or lightheadedness. He had a cardiology evaluation a few months ago, which was normal, and has had no significant cardiac symptoms since, except for the occasional shortness of breath with exertion.  He feels generally well but notes a decrease in physical activity due to cold weather, resulting in slight weight gain. He is due for an eye exam and a prostate blood test, which were not completed during his recent lab work. He is also due for a comprehensive physical examination, including a foot exam and a prostate examination.  His past medical history includes a normal colonoscopy performed less than a year ago. He has no known history of myocardial infarction. There is no family history of colon or prostate cancer, but his father died at the age of 75 from a myocardial infarction.  No chest pain, dizziness, or lightheadedness. Regular bowel movements. Occasional shortness of breath with exertion.       Outpatient Medications Prior to Visit  Medication Sig Dispense Refill   amLODipine (NORVASC) 10 MG tablet Take 1 tablet by mouth once daily 90 tablet 0   Ascorbic Acid (VITAMIN C) 1000 MG tablet Take 1,000 mg by mouth at bedtime.     aspirin EC 81 MG  tablet Take 81 mg by mouth in the morning.     carvedilol (COREG) 25 MG tablet Take 1 tablet by mouth twice daily 180 tablet 0   Cholecalciferol (VITAMIN D3) 250 MCG (10000 UT) TABS Take 10,000 Units by mouth at bedtime.     Continuous Glucose Receiver (DEXCOM G7 RECEIVER) DEVI 1 Act by Does not apply route daily. 9 each 1   Continuous Glucose Sensor (DEXCOM G7 SENSOR) MISC 1 Act by Does not apply route daily. 9 each 1   fenofibrate (TRICOR) 48 MG tablet Take 48 mg by mouth daily.     Insulin Pen Needle 32G X 6 MM MISC 1 Act by Does not apply route daily. 100 each 1   irbesartan (AVAPRO) 300 MG tablet Take 1 tablet by mouth once daily 90 tablet 2   Multiple Vitamin (MULTIVITAMIN WITH MINERALS) TABS tablet Take 1 tablet by mouth in the morning.     omeprazole (PRILOSEC) 40 MG capsule Take 1 capsule by mouth once daily 90 capsule 0   rosuvastatin (CRESTOR) 10 MG tablet TAKE 1 TABLET BY MOUTH ONCE DAILY . APPOINTMENT REQUIRED FOR FUTURE REFILLS 90 tablet 0   spironolactone (ALDACTONE) 25 MG tablet Take 1 tablet by mouth once daily 90 tablet 0   doxazosin (CARDURA) 2 MG tablet Take 1 tablet by mouth once daily 90 tablet 0   empagliflozin (JARDIANCE) 25 MG TABS tablet Take 1 tablet (25 mg total) by mouth daily before breakfast. 90 tablet 1   meloxicam (MOBIC) 15 MG tablet Take 1  tablet (15 mg total) by mouth daily. 30 tablet 0   meloxicam (MOBIC) 15 MG tablet Take 1 tablet (15 mg total) by mouth daily. 30 tablet 0   omega-3 acid ethyl esters (LOVAZA) 1 g capsule Take 2 capsules (2 g total) by mouth 2 (two) times daily. 360 capsule 1   TOUJEO MAX SOLOSTAR 300 UNIT/ML Solostar Pen INJECT 30 UNITS SUBCUTANEOUSLY ONCE DAILY 6 mL 0   No facility-administered medications prior to visit.    ROS Review of Systems  Constitutional: Negative.  Negative for diaphoresis and fatigue.  HENT: Negative.    Respiratory:  Positive for shortness of breath (DOE). Negative for cough, chest tightness and wheezing.    Cardiovascular: Negative.  Negative for chest pain, palpitations and leg swelling.  Gastrointestinal: Negative.  Negative for abdominal pain, constipation, diarrhea, nausea and vomiting.  Endocrine: Negative.   Genitourinary: Negative.  Negative for difficulty urinating.  Musculoskeletal: Negative.   Skin: Negative.   Neurological: Negative.  Negative for dizziness and weakness.  Hematological:  Negative for adenopathy. Does not bruise/bleed easily.  Psychiatric/Behavioral: Negative.      Objective:  BP 124/78 (BP Location: Left Arm, Patient Position: Sitting, Cuff Size: Normal)   Pulse 65   Temp 97.7 F (36.5 C) (Oral)   Ht 5\' 9"  (1.753 m)   Wt 236 lb 12.8 oz (107.4 kg)   SpO2 96%   BMI 34.97 kg/m   BP Readings from Last 3 Encounters:  06/18/23 124/78  03/28/23 110/68  03/13/23 108/69    Wt Readings from Last 3 Encounters:  06/18/23 236 lb 12.8 oz (107.4 kg)  04/03/23 237 lb (107.5 kg)  03/28/23 236 lb (107 kg)    Physical Exam Vitals reviewed.  Constitutional:      General: He is not in acute distress.    Appearance: He is obese. He is not toxic-appearing or diaphoretic.  HENT:     Mouth/Throat:     Mouth: Mucous membranes are moist.  Eyes:     General: No scleral icterus.    Conjunctiva/sclera: Conjunctivae normal.  Cardiovascular:     Rate and Rhythm: Normal rate and regular rhythm.     Heart sounds: Heart sounds are distant. Murmur heard.     No friction rub. No gallop.  Pulmonary:     Effort: Pulmonary effort is normal.     Breath sounds: No wheezing, rhonchi or rales.  Abdominal:     General: Abdomen is protuberant. Bowel sounds are normal.     Palpations: There is no hepatomegaly, splenomegaly or mass.     Tenderness: There is no abdominal tenderness.     Hernia: There is no hernia in the left inguinal area or right inguinal area.  Genitourinary:    Pubic Area: No rash.      Penis: Normal and circumcised.      Testes: Normal.     Epididymis:      Right: Normal.     Left: Normal.     Prostate: Enlarged. Not tender and no nodules present.     Rectum: Normal. Guaiac result negative. No mass, tenderness, anal fissure, external hemorrhoid or internal hemorrhoid. Normal anal tone.  Musculoskeletal:        General: Normal range of motion.     Cervical back: Neck supple.     Right lower leg: No edema.     Left lower leg: No edema.  Lymphadenopathy:     Lower Body: No right inguinal adenopathy. No left inguinal adenopathy.  Skin:  General: Skin is warm and dry.  Neurological:     General: No focal deficit present.     Mental Status: He is alert. Mental status is at baseline.  Psychiatric:        Mood and Affect: Mood normal.        Behavior: Behavior normal.     Lab Results  Component Value Date   WBC 7.0 06/01/2023   HGB 15.3 06/01/2023   HCT 46 06/01/2023   PLT 288 06/01/2023   GLUCOSE 90 12/28/2022   CHOL 170 06/01/2023   TRIG 569 (A) 06/01/2023   HDL 24 (A) 06/01/2023   LDLDIRECT 37.0 05/30/2022   LDLCALC 60 06/01/2023   ALT 30 06/01/2023   AST 19 06/01/2023   NA 137 06/01/2023   K 4.9 06/01/2023   CL 99 06/01/2023   CREATININE 1.7 (A) 06/01/2023   BUN 27 (A) 06/01/2023   CO2 21 06/01/2023   TSH 2.85 06/18/2023   PSA 1.59 06/18/2023   HGBA1C 7.5 06/01/2023   MICROALBUR 7.2 (H) 12/28/2022    No results found.  Assessment & Plan:  DOE (dyspnea on exertion) -     CT CORONARY MORPH W/CTA COR W/SCORE W/CA W/CM &/OR WO/CM; Future -     Brain natriuretic peptide; Future -     Troponin I (High Sensitivity); Future  Benign prostatic hyperplasia without lower urinary tract symptoms -     PSA; Future  Essential hypertension- His BP is over-controlled. Will d'c the alpha blocker. -     TSH; Future -     AMB Referral [facility] Care Management  Encounter for general adult medical examination with abnormal findings- Exam completed, labs reviewed, vaccines reviewed, cancer screenings addressed, pt ed material was given.    Type 2 diabetes mellitus with obesity (HCC) -     HM Diabetes Foot Exam -     Ambulatory referral to Ophthalmology -     Empagliflozin; Take 1 tablet (25 mg total) by mouth daily before breakfast.  Dispense: 90 tablet; Refill: 1 -     AMB Referral [facility] Care Management -     Ozempic (0.25 or 0.5 MG/DOSE); Inject 0.25 mg into the skin once a week.  Dispense: 3 mL; Refill: 0  Hypercalcemia -     Ambulatory referral to Endocrinology  Stage 3a chronic kidney disease (HCC) -     Empagliflozin; Take 1 tablet (25 mg total) by mouth daily before breakfast.  Dispense: 90 tablet; Refill: 1 -     AMB Referral [facility] Care Management  Type 2 diabetes mellitus with stage 3b chronic kidney disease, without long-term current use of insulin (HCC) -     Empagliflozin; Take 1 tablet (25 mg total) by mouth daily before breakfast.  Dispense: 90 tablet; Refill: 1 -     AMB Referral [facility] Care Management  Pure hyperglyceridemia -     Omega-3-acid Ethyl Esters; Take 2 capsules (2 g total) by mouth 2 (two) times daily.  Dispense: 360 capsule; Refill: 1 -     AMB Referral [facility] Care Management -     Toujeo Max SoloStar; Inject 30 Units into the skin daily.  Dispense: 9 mL; Refill: 0  Insulin-requiring or dependent type II diabetes mellitus (HCC) -     Toujeo Max SoloStar; Inject 30 Units into the skin daily.  Dispense: 9 mL; Refill: 0     Follow-up: Return in about 6 months (around 12/19/2023).  Sanda Linger, MD

## 2023-06-20 ENCOUNTER — Telehealth: Payer: Self-pay

## 2023-06-20 DIAGNOSIS — H5203 Hypermetropia, bilateral: Secondary | ICD-10-CM | POA: Diagnosis not present

## 2023-06-20 DIAGNOSIS — H2513 Age-related nuclear cataract, bilateral: Secondary | ICD-10-CM | POA: Diagnosis not present

## 2023-06-20 DIAGNOSIS — H524 Presbyopia: Secondary | ICD-10-CM | POA: Diagnosis not present

## 2023-06-20 DIAGNOSIS — H52223 Regular astigmatism, bilateral: Secondary | ICD-10-CM | POA: Diagnosis not present

## 2023-06-20 NOTE — Progress Notes (Signed)
 Care Guide Pharmacy Note  06/20/2023 Name: Tim Decker MRN: [id number] DOB: Oct 26, 1955  Referred By: Etta Grandchild, MD Reason for referral: Care Coordination (Outreach to schedule with Pharm d )   [name] is a 68 y.o. year old male who is a primary care patient of Etta Grandchild, MD.  [name] was referred to the pharmacist for assistance related to: HTN, HLD, and DMII  An unsuccessful telephone outreach was attempted today to contact the patient who was referred to the pharmacy team for assistance with medication management. Additional attempts will be made to contact the patient.  Penne Lash , RMA     [facility]  [facility], [facility] Guide  Direct Dial: [phone]  Website: [email or web address]

## 2023-06-22 NOTE — Progress Notes (Signed)
 Care Guide Pharmacy Note  06/22/2023 Name: Jentry Warnell MRN: [id number] DOB: Oct 16, 1955  Referred By: Etta Grandchild, MD Reason for referral: Care Coordination (Outreach to schedule with Pharm d )   Callum Wolf is a 68 y.o. year old male who is a primary care patient of Etta Grandchild, MD.  [name] was referred to the pharmacist for assistance related to: HLD and DMII  Successful contact was made with the patient to discuss pharmacy services including being ready for the pharmacist to call at least 5 minutes before the scheduled appointment time and to have medication bottles and any blood pressure readings ready for review. The patient agreed to meet with the pharmacist via telephone visit on (07/05/2023 at 10am).  Burman Nieves, CMA, Care Guide [facility]  [facility], [facility] Guide Direct Dial: [phone]  Fax: [phone] Website: [email or web address]

## 2023-06-29 ENCOUNTER — Encounter (HOSPITAL_COMMUNITY): Payer: Self-pay

## 2023-07-03 ENCOUNTER — Ambulatory Visit (HOSPITAL_COMMUNITY): Admission: RE | Admit: 2023-07-03 | Source: Ambulatory Visit

## 2023-07-05 ENCOUNTER — Other Ambulatory Visit (INDEPENDENT_AMBULATORY_CARE_PROVIDER_SITE_OTHER): Admitting: Pharmacist

## 2023-07-05 DIAGNOSIS — E781 Pure hyperglyceridemia: Secondary | ICD-10-CM

## 2023-07-05 DIAGNOSIS — N1832 Chronic kidney disease, stage 3b: Secondary | ICD-10-CM

## 2023-07-05 DIAGNOSIS — I10 Essential (primary) hypertension: Secondary | ICD-10-CM

## 2023-07-05 DIAGNOSIS — E1122 Type 2 diabetes mellitus with diabetic chronic kidney disease: Secondary | ICD-10-CM

## 2023-07-05 NOTE — Patient Instructions (Signed)
 It was a pleasure speaking with you today!  Stop taking doxazosin and monitor your blood pressure at home. Goal blood pressure is to stay below 130/80.  Keep monitoring blood sugars. I will send an invite for you to share your blood sugar readings with me through [employer].   Continue lifestyle changes to help control your blood sugars and blood pressure as you have been doing.  I will call back in 3 weeks to see how your blood pressures are doing.  Feel free to call with any questions or concerns!  Arbutus Leas, PharmD, BCPS, CPP Clinical Pharmacist Practitioner [facility] [phone]

## 2023-07-05 NOTE — Progress Notes (Signed)
 07/05/2023 Name: Tim Decker MRN: [id number] DOB: 30-Nov-1955  Chief Complaint  Patient presents with   Diabetes   Hypertension   Hyperlipidemia   Medication Management    Tim Decker is a 68 y.o. year old male who presented for a telephone visit.   They were referred to the pharmacist by their PCP for assistance in managing diabetes, hypertension, and hyperlipidemia.    Subjective:  Care Team: Primary Care Provider: Etta Grandchild, MD ; Next Scheduled Visit: not scheduled Pt notes his insurance has a company who has a MD that calls him and orders labs  Medication Access/Adherence  Current Pharmacy:  [facility] [address], [place] - [address] [place] [address] Phone: [phone] Fax: [phone]   Patient reports affordability concerns with their medications: Yes  Patient reports access/transportation concerns to their pharmacy: No  Patient reports adherence concerns with their medications:  Yes    Pt notes he has not been taking insulin for about a year. He was unsure if he is taking Jardiance however there is no fill hx. He did not start Ozempic due to cost and also does not feel he needs it   Diabetes:  Current medications: none *Toujeo, Jardiance, and Ozempic are on medication list however pt is not currently taking Medications tried in the past: Farxiga, metformin, Rybelsus  Current glucose readings: Pt notes average on Libre is 130, ranges 80-200 Using Freestyle Libre meter   Hypertension:  Current medications: amlodipine 10 mg daily, carvedilol 25 mg twice daily, irbesartan 300 mg daily, spironolactone 25 mg daily *Pt is still taking doxazosin - was not aware PCP stopped it at 3/3 appt Medications previously tried: benazepril, Bystolic, chlorthalidone, metoprolol succinate,   Patient has a validated, automated, upper arm home BP cuff Current blood pressure readings readings: around 120/80, sometimes  lower  Hyperlipidemia/ASCVD Risk Reduction  Current lipid lowering medications: Rosuvastatin 10 mg daily, Lovaza 1 g 2 cap twice daily, fenofibrate 48 mg daily (recently started by insurance provider per pt?) Medications tried in the past: Vascepa  *Lovaza recently filled howevr had been about 6 months late  Antiplatelet regimen: aspirin 81 mg  The 10-year ASCVD risk score (Arnett DK, et al., 2019) is: 47.1%*   Values used to calculate the score:     Age: 60 years     Sex: Male     Is Non-Hispanic African American: No     Diabetic: Yes     Tobacco smoker: Yes     Systolic Blood Pressure: 124 mmHg     Is BP treated: Yes     HDL Cholesterol: 24 mg/dL*     Total Cholesterol: 170 mg/dL*     * - Cholesterol units were assumed for this score calculation     Objective:  Lab Results  Component Value Date   HGBA1C 7.5 06/01/2023    Lab Results  Component Value Date   CREATININE 1.7 (A) 06/01/2023   BUN 27 (A) 06/01/2023   NA 137 06/01/2023   K 4.9 06/01/2023   CL 99 06/01/2023   CO2 21 06/01/2023    Lab Results  Component Value Date   CHOL 170 06/01/2023   HDL 24 (A) 06/01/2023   LDLCALC 60 06/01/2023   LDLDIRECT 37.0 05/30/2022   TRIG 569 (A) 06/01/2023   CHOLHDL 10 05/30/2022    Medications Reviewed Today     Reviewed by Bonita Quin, RPH (Pharmacist) on 07/05/23 at 1021  Med List Status: <None>   Medication Order Taking? Sig Documenting Provider Last Dose Status Informant  amLODipine (NORVASC) 10 MG tablet [id number] Yes Take 1 tablet by mouth once daily Etta Grandchild, MD Taking Active   Ascorbic Acid (VITAMIN C) 1000 MG tablet [id number]  Take 1,000 mg by mouth at bedtime. [provider]  Active Self  aspirin EC 81 MG tablet [id number]  Take 81 mg by mouth in the morning. [provider]  Active Self  carvedilol (COREG) 25 MG tablet [id number] Yes Take 1 tablet by mouth twice daily Etta Grandchild, MD Taking Active   Cholecalciferol  (VITAMIN D3) 250 MCG (10000 UT) TABS [id number]  Take 10,000 Units by mouth at bedtime. [provider]  Active Self  empagliflozin (JARDIANCE) 25 MG TABS tablet [id number] No Take 1 tablet (25 mg total) by mouth daily before breakfast.  Patient not taking: Reported on 07/05/2023   Etta Grandchild, MD Not Taking Active   fenofibrate (TRICOR) 48 MG tablet [id number] Yes Take 48 mg by mouth daily. [provider] Taking Active   insulin glargine, 2 Unit Dial, (TOUJEO MAX SOLOSTAR) 300 UNIT/ML Solostar Pen [id number] No Inject 30 Units into the skin daily.  Patient not taking: Reported on 07/05/2023   Etta Grandchild, MD Not Taking Active   Insulin Pen Needle 32G X 6 MM MISC [id number]  1 Act by Does not apply route daily. Etta Grandchild, MD  Active Self  irbesartan Evlyn Kanner) 300 MG tablet [id number] Yes Take 1 tablet by mouth once daily Jodelle Red, MD Taking Active   Multiple Vitamin (MULTIVITAMIN WITH MINERALS) TABS tablet [id number]  Take 1 tablet by mouth in the morning. [provider]  Active Self  omega-3 acid ethyl esters (LOVAZA) 1 g capsule [id number] Yes Take 2 capsules (2 g total) by mouth 2 (two) times daily. Etta Grandchild, MD Taking Active   omeprazole (PRILOSEC) 40 MG capsule [id number]  Take 1 capsule by mouth once daily Etta Grandchild, MD  Active   rosuvastatin (CRESTOR) 10 MG tablet [id number] Yes TAKE 1 TABLET BY MOUTH ONCE DAILY . APPOINTMENT REQUIRED FOR FUTURE REFILLS Etta Grandchild, MD Taking Active   Semaglutide,0.25 or 0.5MG /DOS, (OZEMPIC, 0.25 OR 0.5 MG/DOSE,) 2 MG/3ML SOPN [id number] No Inject 0.25 mg into the skin once a week.  Patient not taking: Reported on 07/05/2023   Etta Grandchild, MD Not Taking Active   spironolactone (ALDACTONE) 25 MG tablet [id number] Yes Take 1 tablet by mouth once daily Etta Grandchild, MD Taking Active               Assessment/Plan:   Diabetes: - Currently uncontrolled, A1c goal <7% - Recommend to  continue lifestyle modifications  - Discuss PAP option if need high cost medication for diabetes or CKD - Pt would benefit from [place] or [place] for diabetes, chronic kidney disease, and cardiomyopathy however medication cost is an issue - Will send LibreView invite to share readings from his [place]   Hypertension: - Currently controlled, BP goal <130/80 - Recommend to stop doxazosin and continue monitoring blood pressure to see if remains controlled. Follow up in 3-4 weeks for BP readings.   Hyperlipidemia/ASCVD Risk Reduction: - Currently uncontrolled. LDL goal <70, TG goal <150. Has long hx of uncontrolled TG - Recommend to continue current regimen.  - Follow up on Lovaza adherence. Consider increasing rosuvastatin to 20 mg. - Recommend getting updated lipid panel next month s/p initiation of fenofibrate  Follow Up Plan: 3-4 weeks  Arbutus Leas, PharmD, BCPS, CPP Clinical Pharmacist Practitioner [facility] [phone]

## 2023-07-13 DIAGNOSIS — E1122 Type 2 diabetes mellitus with diabetic chronic kidney disease: Secondary | ICD-10-CM | POA: Diagnosis not present

## 2023-07-13 DIAGNOSIS — Z008 Encounter for other general examination: Secondary | ICD-10-CM | POA: Diagnosis not present

## 2023-07-13 DIAGNOSIS — N1832 Chronic kidney disease, stage 3b: Secondary | ICD-10-CM | POA: Diagnosis not present

## 2023-07-13 DIAGNOSIS — E785 Hyperlipidemia, unspecified: Secondary | ICD-10-CM | POA: Diagnosis not present

## 2023-07-13 DIAGNOSIS — E1169 Type 2 diabetes mellitus with other specified complication: Secondary | ICD-10-CM | POA: Diagnosis not present

## 2023-07-16 ENCOUNTER — Telehealth (HOSPITAL_COMMUNITY): Payer: Self-pay | Admitting: Emergency Medicine

## 2023-07-16 NOTE — Telephone Encounter (Signed)
 Reaching out to patient to offer assistance regarding upcoming cardiac imaging study; pt verbalizes understanding of appt date/time, parking situation and where to check in, pre-test NPO status and medications ordered, and verified current allergies; name and call back number provided for further questions should they arise Rockwell Alexandria RN Navigator Cardiac Imaging [facility] [phone] office [phone] cell

## 2023-07-17 ENCOUNTER — Ambulatory Visit (HOSPITAL_COMMUNITY)
Admission: RE | Admit: 2023-07-17 | Discharge: 2023-07-17 | Disposition: A | Discharge status: Home / Self Care | Source: Ambulatory Visit | Attending: Internal Medicine | Admitting: Internal Medicine

## 2023-07-17 DIAGNOSIS — I2510 Atherosclerotic heart disease of native coronary artery without angina pectoris: Secondary | ICD-10-CM | POA: Insufficient documentation

## 2023-07-17 DIAGNOSIS — I10 Essential (primary) hypertension: Secondary | ICD-10-CM | POA: Diagnosis not present

## 2023-07-17 DIAGNOSIS — I429 Cardiomyopathy, unspecified: Secondary | ICD-10-CM | POA: Insufficient documentation

## 2023-07-17 DIAGNOSIS — F17200 Nicotine dependence, unspecified, uncomplicated: Secondary | ICD-10-CM | POA: Diagnosis not present

## 2023-07-17 DIAGNOSIS — R7303 Prediabetes: Secondary | ICD-10-CM | POA: Insufficient documentation

## 2023-07-17 DIAGNOSIS — E785 Hyperlipidemia, unspecified: Secondary | ICD-10-CM | POA: Insufficient documentation

## 2023-07-17 DIAGNOSIS — R9431 Abnormal electrocardiogram [ECG] [EKG]: Secondary | ICD-10-CM | POA: Insufficient documentation

## 2023-07-17 DIAGNOSIS — R0609 Other forms of dyspnea: Secondary | ICD-10-CM | POA: Insufficient documentation

## 2023-07-17 MED ORDER — NITROGLYCERIN 0.4 MG SL SUBL
SUBLINGUAL_TABLET | SUBLINGUAL | Status: AC
  Filled 2023-07-17: qty 2

## 2023-07-17 MED ORDER — NITROGLYCERIN 0.4 MG SL SUBL
0.8000 mg | SUBLINGUAL_TABLET | Freq: Once | SUBLINGUAL | Status: AC
  Administered 2023-07-17: 0.8 mg via SUBLINGUAL

## 2023-07-17 MED ORDER — IOHEXOL 350 MG/ML SOLN
100.0000 mL | Freq: Once | INTRAVENOUS | Status: AC | PRN
  Administered 2023-07-17: 100 mL via INTRAVENOUS

## 2023-07-19 NOTE — Progress Notes (Signed)
 Does cardiology automatically contact him about the cardiac cath?

## 2023-07-26 ENCOUNTER — Other Ambulatory Visit (INDEPENDENT_AMBULATORY_CARE_PROVIDER_SITE_OTHER): Admitting: Pharmacist

## 2023-07-26 DIAGNOSIS — N1832 Chronic kidney disease, stage 3b: Secondary | ICD-10-CM

## 2023-07-26 DIAGNOSIS — I10 Essential (primary) hypertension: Secondary | ICD-10-CM

## 2023-07-26 DIAGNOSIS — E781 Pure hyperglyceridemia: Secondary | ICD-10-CM

## 2023-07-26 NOTE — Progress Notes (Signed)
 07/26/2023 Name: Tim Decker MRN: [id number] DOB: 01-06-1956  Chief Complaint  Patient presents with   Medication Management    Tim Decker is a 68 y.o. year old male who presented for a telephone visit.   They were referred to the pharmacist by their PCP for assistance in managing diabetes, hypertension, and hyperlipidemia.    Subjective:  Care Team: Primary Care Provider: Etta Grandchild, MD ; Next Scheduled Visit: not scheduled Pt notes his insurance has a company who has a MD that calls him and orders labs  Medication Access/Adherence  Current Pharmacy:  [facility] [address] [place] - [address] [place] [address] Phone: [phone] Fax: [phone]   Patient reports affordability concerns with their medications: Yes  Patient reports access/transportation concerns to their pharmacy: No  Patient reports adherence concerns with their medications:  Yes    Pt notes he has not been taking insulin for about a year. He was unsure if he is taking Jardiance however there is no fill hx. He did not start Ozempic due to cost and also does not feel he needs it   Diabetes:  Current medications: none *Toujeo, Jardiance, and Ozempic are on medication list however pt is not currently taking Medications tried in the past: Farxiga, metformin, Rybelsus  Current glucose readings: Pt notes average on Libre is 140 Using Freestyle Libre meter   Hypertension:  Current medications: amlodipine 10 mg daily, carvedilol 25 mg twice daily, irbesartan 300 mg daily, spironolactone 25 mg daily  Medications previously tried: benazepril, Bystolic, chlorthalidone, metoprolol succinate,   Patient has a validated, automated, upper arm home BP cuff Current blood pressure readings readings:  Has not checked BP recently  Hyperlipidemia/ASCVD Risk Reduction  Current lipid lowering medications: Rosuvastatin 10 mg daily, Lovaza 1 g 2 cap twice daily,  fenofibrate 48 mg daily (recently started by insurance provider per pt?) Medications tried in the past: Vascepa  *Lovaza recently filled howevr had been about 6 months late  Antiplatelet regimen: aspirin 81 mg  The 10-year ASCVD risk score (Arnett DK, et al., 2019) is: 40.7%*   Values used to calculate the score:     Age: 48 years     Sex: Male     Is Non-Hispanic African American: No     Diabetic: Yes     Tobacco smoker: Yes     Systolic Blood Pressure: 111 mmHg     Is BP treated: Yes     HDL Cholesterol: 24 mg/dL*     Total Cholesterol: 170 mg/dL*     * - Cholesterol units were assumed for this score calculation   Recent had CAC scoring done and CT showed very high CAC 1308 (91st percentile) Pt notes he has not heard from cardio regarding the results Pt denies chest pain or SOB  Objective:  Lab Results  Component Value Date   HGBA1C 7.5 06/01/2023    Lab Results  Component Value Date   CREATININE 1.7 (A) 06/01/2023   BUN 27 (A) 06/01/2023   NA 137 06/01/2023   K 4.9 06/01/2023   CL 99 06/01/2023   CO2 21 06/01/2023    Lab Results  Component Value Date   CHOL 170 06/01/2023   HDL 24 (A) 06/01/2023   LDLCALC 60 06/01/2023   LDLDIRECT 37.0 05/30/2022   TRIG 569 (A) 06/01/2023   CHOLHDL 10 05/30/2022    Medications Reviewed Today     Reviewed by Bonita Quin,  RPH (Pharmacist) on 07/26/23 at 1630  Med List Status: <None>   Medication Order Taking? Sig Documenting Provider Last Dose Status Informant  amLODipine (NORVASC) 10 MG tablet [id number] No Take 1 tablet by mouth once daily Tim Grandchild, MD Taking Active   Ascorbic Acid (VITAMIN C) 1000 MG tablet [id number] No Take 1,000 mg by mouth at bedtime. [provider] Taking Active Self  aspirin EC 81 MG tablet [id number] No Take 81 mg by mouth in the morning. [provider] Taking Active Self  carvedilol (COREG) 25 MG tablet [id number] No Take 1 tablet by mouth twice daily Tim Grandchild, MD Taking Active   Cholecalciferol (VITAMIN D3) 250 MCG (10000 UT) TABS [id number] No Take 10,000 Units by mouth at bedtime. [provider] Taking Active Self  Continuous Glucose Sensor (FREESTYLE LIBRE 3 PLUS SENSOR) MISC [id number] No 1 each by Does not apply route. Change sensor every 15 days. [provider] Taking Active   fenofibrate (TRICOR) 48 MG tablet [id number] No Take 48 mg by mouth daily. [provider] Taking Active   irbesartan (AVAPRO) 300 MG tablet [id number] No Take 1 tablet by mouth once daily Jodelle Red, MD Taking Active   Multiple Vitamin (MULTIVITAMIN WITH MINERALS) TABS tablet [id number] No Take 1 tablet by mouth in the morning. [provider] Taking Active Self  omega-3 acid ethyl esters (LOVAZA) 1 g capsule [id number] No Take 2 capsules (2 g total) by mouth 2 (two) times daily. Tim Grandchild, MD Taking Active   omeprazole (PRILOSEC) 40 MG capsule [id number] No Take 1 capsule by mouth once daily Tim Grandchild, MD Taking Active   rosuvastatin (CRESTOR) 10 MG tablet [id number] No TAKE 1 TABLET BY MOUTH ONCE DAILY . APPOINTMENT REQUIRED FOR FUTURE REFILLS Tim Grandchild, MD Taking Active   spironolactone (ALDACTONE) 25 MG tablet [id number] No Take 1 tablet by mouth once daily Tim Grandchild, MD Taking Active               Assessment/Plan:   Diabetes: - Currently uncontrolled, A1c goal <7% - Recommend to continue lifestyle modifications  - Discuss PAP option if need high cost medication for diabetes or CKD - Pt would benefit from [place] or [place] for diabetes, chronic kidney disease, and cardiomyopathy however medication cost is an issue - Will send LibreView invite to share readings from his [place]   Hypertension: - Currently controlled, BP goal <130/80 - Recommend to check blood pressure to see if remains controlled. Notify us if seeing BP >140/90s   Hyperlipidemia/ASCVD Risk Reduction: - Currently  uncontrolled. LDL goal <70, TG goal <150. Has long hx of uncontrolled TG - Recommend to continue current regimen.  - Follow up on Lovaza adherence. Consider increasing rosuvastatin to 20 mg. - Recommend getting updated lipid panel next month s/p initiation of fenofibrate - Sent cardio referral to get appt due to very elevated CAC  Follow Up Plan: 3-4 weeks  Arbutus Leas, PharmD, BCPS, CPP Clinical Pharmacist Practitioner [facility] [phone]

## 2023-07-26 NOTE — Patient Instructions (Signed)
 It was a pleasure speaking with you today!  Continue current regimen. Check blood pressures at home. Contact us if having readings above 140/90.  I have sent a referral to cardiology so that you can be seen regarding the results of your cardiac CT.  Feel free to call with any questions or concerns!  Arbutus Leas, PharmD, BCPS, CPP Clinical Pharmacist Practitioner [facility] [phone]

## 2023-08-06 ENCOUNTER — Encounter: Payer: Self-pay | Admitting: Pharmacist

## 2023-08-13 ENCOUNTER — Encounter (HOSPITAL_BASED_OUTPATIENT_CLINIC_OR_DEPARTMENT_OTHER): Payer: Self-pay | Admitting: Cardiology

## 2023-08-13 ENCOUNTER — Ambulatory Visit (HOSPITAL_BASED_OUTPATIENT_CLINIC_OR_DEPARTMENT_OTHER): Admitting: Cardiology

## 2023-08-13 VITALS — BP 114/67 | HR 64 | Ht 69.0 in | Wt 232.7 lb

## 2023-08-13 DIAGNOSIS — F17200 Nicotine dependence, unspecified, uncomplicated: Secondary | ICD-10-CM

## 2023-08-13 DIAGNOSIS — Z7189 Other specified counseling: Secondary | ICD-10-CM | POA: Diagnosis not present

## 2023-08-13 DIAGNOSIS — Z716 Tobacco abuse counseling: Secondary | ICD-10-CM

## 2023-08-13 DIAGNOSIS — E1121 Type 2 diabetes mellitus with diabetic nephropathy: Secondary | ICD-10-CM | POA: Diagnosis not present

## 2023-08-13 DIAGNOSIS — F17210 Nicotine dependence, cigarettes, uncomplicated: Secondary | ICD-10-CM | POA: Diagnosis not present

## 2023-08-13 DIAGNOSIS — Z712 Person consulting for explanation of examination or test findings: Secondary | ICD-10-CM | POA: Diagnosis not present

## 2023-08-13 DIAGNOSIS — E782 Mixed hyperlipidemia: Secondary | ICD-10-CM

## 2023-08-13 DIAGNOSIS — I25118 Atherosclerotic heart disease of native coronary artery with other forms of angina pectoris: Secondary | ICD-10-CM

## 2023-08-13 DIAGNOSIS — E785 Hyperlipidemia, unspecified: Secondary | ICD-10-CM | POA: Diagnosis not present

## 2023-08-13 DIAGNOSIS — Z720 Tobacco use: Secondary | ICD-10-CM

## 2023-08-13 MED ORDER — NITROGLYCERIN 0.4 MG SL SUBL: 0.4000 mg | SUBLINGUAL_TABLET | SUBLINGUAL | 3 refills | Status: AC | PRN

## 2023-08-13 MED ORDER — ROSUVASTATIN CALCIUM 20 MG PO TABS: 20.0000 mg | ORAL_TABLET | Freq: Every day | ORAL | 3 refills | Status: AC

## 2023-08-13 MED ORDER — FENOFIBRATE 145 MG PO TABS: 145.0000 mg | ORAL_TABLET | Freq: Every day | ORAL | 3 refills | Status: AC

## 2023-08-13 NOTE — Patient Instructions (Signed)
 Medication Instructions:  Your physician has recommended you make the following change in your medication:   START Rosuvastatin  20 mg START Fenofibrate 145 mg START Nitroglycerin   We discussed changing your meds to try to get your cholesterol to goal (Goal LDL <55, Goal triglycerides <150). -increase the rosuvastatin  to 20 mg daily (can 2 of the 10 mg tabs of what you have) -increase the fenofibrate to 145 mg daily (can take 3 pills of the 48 mg daily to use them up, then change to 145 mg dose)  Get your labs done in about 2 mos, fasting. Will check your cholesterol, liver function, and lp(a)  As we discussed, if you only have shortness of breath with activity that goes away with rest, this is stable angina and manageable with medications. If you have symptoms that are severe/sustained at rest, this needs 911/ER.  I am sending you nitroglycerin . If you have symptoms, put a nitroglycerin  under your tongue and give it 5 minutes. If symptoms aren't resolved, take a 2nd nitroglycerin  and give it 5 minutes. If not completely gone, take a 3rd nitroglycerin  and call 911.   *If you need a refill on your cardiac medications before your next appointment, please call your pharmacy*  Lab Work: Your physician recommends that you return for lab work in: FASTING LIPIDS, LFT and LPA  If you have labs (blood work) drawn today and your tests are completely normal, you will receive your results only by: MyChart Message (if you have MyChart) OR A paper copy in the mail If you have any lab test that is abnormal or we need to change your treatment, we will call you to review the results.  Follow-Up: At [facility], you and your health needs are our priority.  As part of our continuing mission to provide you with exceptional heart care, our providers are all part of one team.  This team includes your primary Cardiologist (physician) and Advanced Practice Providers or APPs (Physician Assistants and  Nurse Practitioners) who all work together to provide you with the care you need, when you need it.  Please follow up in 3 months with Dr. Veryl Gottron, Slater Duncan, NP or Neomi Banks, NP   We recommend signing up for the patient portal called "MyChart".  Sign up information is provided on this After Visit Summary.  MyChart is used to connect with patients for Virtual Visits (Telemedicine).  Patients are able to view lab/test results, encounter notes, upcoming appointments, etc.  Non-urgent messages can be sent to your provider as well.   To learn more about what you can do with MyChart, go to [email or web address].

## 2023-08-13 NOTE — Progress Notes (Signed)
 Cardiology Office Note:  .   Date:  08/13/2023  ID:  Tim Decker, DOB [birth year], MRN [id number] PCP: Arcadio Knuckles, MD  [facility] Providers Cardiologist:  Sheryle Donning, MD {  History of Present Illness: .   Tim Decker is a 68 y.o. male with a hx of CAD, cardiomyopathy (previously presumed nonischemic), hyperlipidemia, hypertension, and pre-diabetes, who is seen for follow-up. I initially met him 02/08/2021 as a new consult at the request of Arcadio Knuckles, MD for the evaluation and management of abnormal electrocardiogram results.    CV history: echo 2020 with EF 35-40%. Severe LVH. Myoview  2020 EF 44%, mild diffuse hypokinesis. Small fixed mild defect in apical area, no ischemia. LVEF normalized to 55-60% 05/2021. We have discussed Entresto which he declined due to cost. Coronary CT 2025 with Ca score 1308 (91st %ile), severe mLAD stenosis, moderate D1 and Lcx stenosis.   Fhx: his father died of a myocardial infarction at age 53. Smoking is heavy in his family. His sister smokes, and has suffered a stroke and had a CABG procedure.  Today:  Reviewed CT coronary findings of severe calcification, severe mLAD stenosis, moderate D1 and Lcx stenosis. He denies any symptoms. Reviewed his cholesterol numbers, which showed TG 569 (highest previously 1162), LDL 60, HDL 24.   Gets shortwinded with activity, but nonlimiting. No chest pain/tightness. Very busy with work.   We reviewed his actual CT images. Could not be submitted to heartflow. Reviewed stable vs. Unstable angina, management of each.   Still smoking 1 ppd, has thought about quitting but mostly precontemplative. Discussed cessation again today.   ROS: Denies chest pain, shortness of breath at rest or with normal exertion. No PND, orthopnea, LE edema or unexpected weight gain. No syncope or palpitations. ROS otherwise negative except as noted.   Studies Reviewed: Aaron Aas    EKG:       CT images:  LAD    High risk/protruding plaque in LCx  Physical Exam:   VS:  BP 114/67   Pulse 64   Ht 5\' 9"  (1.753 m)   Wt 232 lb 11.2 oz (105.6 kg)   SpO2 96%   BMI 34.36 kg/m    Wt Readings from Last 3 Encounters:  08/13/23 232 lb 11.2 oz (105.6 kg)  06/18/23 236 lb 12.8 oz (107.4 kg)  04/03/23 237 lb (107.5 kg)    GEN: Well nourished, well developed in no acute distress HEENT: Normal, moist mucous membranes NECK: No JVD CARDIAC: regular rhythm, normal S1 and S2, no rubs or gallops. No murmur. VASCULAR: Radial and DP pulses 2+ bilaterally. No carotid bruits RESPIRATORY:  Clear to auscultation without rales, wheezing or rhonchi  ABDOMEN: Soft, non-tender, non-distended MUSCULOSKELETAL:  Ambulates independently SKIN: Warm and dry, no edema NEUROLOGIC:  Alert and oriented x 3. No focal neuro deficits noted. PSYCHIATRIC:  Normal affect    ASSESSMENT AND PLAN: .    CAD -Ca score 1308 (91st %ile), severe mLAD, moderate D1 and Lcx. See images above -denies any chest pain. Has nonlimiting dyspnea on exertion. Discussed cath, but given lack of major/high risk symptoms, decided together to focus on medical management first. -reviewed difference between stable and unstable angina -discussed importance of management of risk factors, especially lipids, tobacco use, type II diabetes, hypertension -reviewed red flag warning signs that need immediate medical attention  Cardiomyopathy, suspect nonischemic due to stress test, likely 2/2 hypertensive heart disease Severe LVH Chronic kidney disease, most recent stage 3b (GFR 40)  -  NYHA class I -EF 35-40% in 02/2019-->normalized to 55-60% 05/2021 -had myoview  at that time without ischemia, has never had cath, but CT 2025 as above -on carvedilol  -on spironolactone , irbesartan  -was on SGLT2i, no longer taking -we discussed entresto, declined due to cost   Hypertension: -well controlled today -on carvedilol , amlodipine , spironolactone , irbesartan ,  doxazosin  -checks home BP, contacting me if consistently >130/80   Mixed hyperlipidemia, with severely elevated TG -continue rosuvastatin . LDL was 60 on 10 mg daily dose. Discussed goal LDL <55 -no longer taking lovaza  2/2 cost. Reviewed his elevated TG. Discussed options; vascepa  and lovaza  declined due to cost. Discussed increasing statin dose, increasing fibrate (currently on 48 mg/day). He is amenable to both. Increasing to rosuvastatin  20 mg daily and fenofibrate 145 mg daily, recheck labs in 2 mos. -discussed potential for clinical trial based on response -checking lpa with recheck labs   Type II diabetes, with proteinuria Obesity -on aspirin, rosuvastatin  -no longer on dapagliflozin  -now on continuous glucose monitor, sugars well controlled, not taking insulin  -would benefit from GLP for CV risk reduction, cost is major factor -A1c 7.5   Tobacco abuse: The patient was counseled on tobacco cessation today for 3 minutes.  Counseling included reviewing the risks of smoking tobacco products, how it impacts the patient's current medical diagnoses and different strategies for quitting.  Pharmacotherapy to aid in tobacco cessation was not prescribed today.   CV risk counseling and prevention -recommend heart healthy/Mediterranean diet, with whole grains, fruits, vegetable, fish, lean meats, nuts, and olive oil. Limit salt. -recommend moderate walking, 3-5 times/week for 30-50 minutes each session. Aim for at least 150 minutes.week. Goal should be pace of 3 miles/hours, or walking 1.5 miles in 30 minutes -recommend avoidance of tobacco products. Avoid excess alcohol.  Dispo: 3 mos this time, or sooner as needed  Total time of encounter: I spent 60 minutes dedicated to the care of this patient on the date of this encounter to include pre-visit review of records, face-to-face time with the patient discussing conditions above, and clinical documentation with the electronic health record. We  specifically spent time today discussing test results, including actual images. Discussed guidelines, cath, recommendations for medical management. Discussed goals, medications, monitoring response.   Signed, Sheryle Donning, MD   Sheryle Donning, MD, PhD, [facility]  [facility]  [place]  [facility] at [facility] at [facility] [address], [address] [place], [place] [address] [phone]

## 2023-08-16 ENCOUNTER — Other Ambulatory Visit

## 2023-08-20 ENCOUNTER — Other Ambulatory Visit (HOSPITAL_BASED_OUTPATIENT_CLINIC_OR_DEPARTMENT_OTHER): Payer: Self-pay | Admitting: Cardiology

## 2023-08-20 ENCOUNTER — Other Ambulatory Visit: Payer: Self-pay | Admitting: Internal Medicine

## 2023-08-20 ENCOUNTER — Other Ambulatory Visit

## 2023-08-20 DIAGNOSIS — I10 Essential (primary) hypertension: Secondary | ICD-10-CM

## 2023-08-20 DIAGNOSIS — E781 Pure hyperglyceridemia: Secondary | ICD-10-CM

## 2023-08-20 DIAGNOSIS — E1121 Type 2 diabetes mellitus with diabetic nephropathy: Secondary | ICD-10-CM

## 2023-08-20 DIAGNOSIS — I429 Cardiomyopathy, unspecified: Secondary | ICD-10-CM

## 2023-08-20 DIAGNOSIS — I119 Hypertensive heart disease without heart failure: Secondary | ICD-10-CM

## 2023-08-20 DIAGNOSIS — E876 Hypokalemia: Secondary | ICD-10-CM

## 2023-08-20 DIAGNOSIS — I428 Other cardiomyopathies: Secondary | ICD-10-CM

## 2023-08-20 DIAGNOSIS — N1832 Chronic kidney disease, stage 3b: Secondary | ICD-10-CM

## 2023-08-20 DIAGNOSIS — E1122 Type 2 diabetes mellitus with diabetic chronic kidney disease: Secondary | ICD-10-CM

## 2023-08-20 NOTE — Progress Notes (Unsigned)
 08/20/2023 Name: Tim Decker MRN: [id number] DOB: 03/29/1956  Chief Complaint  Patient presents with   Diabetes   Medication Management    Tim Decker is a 68 y.o. year old male who presented for a telephone visit.   They were referred to the pharmacist by their PCP for assistance in managing diabetes, hypertension, and hyperlipidemia.   Subjective:  Care Team: Primary Care Provider: Arcadio Knuckles, MD ; Next Scheduled Visit: not scheduled Pt notes his insurance has a company who has a MD that calls him and orders labs  Medication Access/Adherence  Current Pharmacy:  [facility] [address], [place] - [address] [place] [address] Phone: [phone] Fax: [phone]   Patient reports affordability concerns with their medications: Yes  Patient reports access/transportation concerns to their pharmacy: No  Patient reports adherence concerns with their medications:  Yes    Pt notes he has not been taking insulin  for about a year. He was unsure if he is taking Jardiance  however there is no fill hx. He did not start Ozempic  due to cost and also does not feel he needs it  *Unable to take any high cost meds   Diabetes:  Current medications: none *Toujeo , Jardiance , and Ozempic  are on medication list however pt is not currently taking Medications tried in the past: Farxiga , metformin , Rybelsus   Current glucose readings: Average 120s, gets alerts for lows over night Using Freestyle Libre meter   Hypertension:  Current medications: amlodipine  10 mg daily, carvedilol  25 mg twice daily, irbesartan  300 mg daily, spironolactone  25 mg daily  Medications previously tried: benazepril , Bystolic , chlorthalidone , metoprolol  succinate,   Patient has a validated, automated, upper arm home BP cuff Current blood pressure readings readings:  Has not checked BP recently  Hyperlipidemia/ASCVD Risk Reduction  Current lipid lowering  medications: Rosuvastatin  20 mg daily, Lovaza  1 g 2 cap twice daily, fenofibrate  145 mg daily  Medications tried in the past: Vascepa   *Rosuvastatin  and fenofibrate  increased by cardio 4/28  Antiplatelet regimen: aspirin 81 mg  The 10-year ASCVD risk score (Arnett DK, et al., 2019) is: 42.2%*   Values used to calculate the score:     Age: 68 years     Sex: Male     Is Non-Hispanic African American: No     Diabetic: Yes     Tobacco smoker: Yes     Systolic Blood Pressure: 114 mmHg     Is BP treated: Yes     HDL Cholesterol: 24 mg/dL*     Total Cholesterol: 170 mg/dL*     * - Cholesterol units were assumed for this score calculation   Recent had CAC scoring done and CT showed very high CAC 1308 (91st percentile) - had cardio consult, planned for medication management instead of cath for now  Objective:  Lab Results  Component Value Date   HGBA1C 7.5 06/01/2023    Lab Results  Component Value Date   CREATININE 1.7 (A) 06/01/2023   BUN 27 (A) 06/01/2023   NA 137 06/01/2023   K 4.9 06/01/2023   CL 99 06/01/2023   CO2 21 06/01/2023    Lab Results  Component Value Date   CHOL 170 06/01/2023   HDL 24 (A) 06/01/2023   LDLCALC 60 06/01/2023   LDLDIRECT 37.0 05/30/2022   TRIG 569 (A) 06/01/2023   CHOLHDL 10 05/30/2022    Medications Reviewed Today     Reviewed by Dion Frankel, [facility] (Pharmacist)  on 08/20/23 at 1636  Med List Status: <None>   Medication Order Taking? Sig Documenting Provider Last Dose Status Informant  amLODipine  (NORVASC ) 10 MG tablet [id number] Yes Take 1 tablet by mouth once daily Arcadio Knuckles, MD Taking Active   Ascorbic Acid (VITAMIN C) 1000 MG tablet [id number] Yes Take 1,000 mg by mouth at bedtime. [provider] Taking Active Self  aspirin EC 81 MG tablet [id number] Yes Take 81 mg by mouth in the morning. [provider] Taking Active Self  carvedilol  (COREG ) 25 MG tablet [id number] Yes Take 1 tablet by mouth twice daily  Arcadio Knuckles, MD Taking Active   Cholecalciferol  (VITAMIN D3) 250 MCG (10000 UT) TABS [id number] Yes Take 10,000 Units by mouth at bedtime. [provider] Taking Active Self  Continuous Glucose Sensor (FREESTYLE LIBRE 3 PLUS SENSOR) MISC [id number] Yes 1 each by Does not apply route. Change sensor every 15 days. [provider] Taking Active   fenofibrate  (TRICOR ) 145 MG tablet [id number] Yes Take 1 tablet (145 mg total) by mouth daily. Sheryle Donning, MD Taking Active   irbesartan  (AVAPRO ) 300 MG tablet [id number] Yes Take 1 tablet by mouth once daily Sheryle Donning, MD Taking Active   Multiple Vitamin (MULTIVITAMIN WITH MINERALS) TABS tablet [id number] Yes Take 1 tablet by mouth in the morning. [provider] Taking Active Self  nitroGLYCERIN  (NITROSTAT ) 0.4 MG SL tablet [id number] Yes Place 1 tablet (0.4 mg total) under the tongue every 5 (five) minutes as needed for chest pain. Sheryle Donning, MD Taking Active   omega-3 acid ethyl esters (LOVAZA ) 1 g capsule [id number] Yes Take 2 capsules (2 g total) by mouth 2 (two) times daily. Arcadio Knuckles, MD Taking Active   omeprazole  [facility]) 40 MG capsule [id number] Yes Take 1 capsule by mouth once daily Arcadio Knuckles, MD Taking Active   rosuvastatin  (CRESTOR ) 20 MG tablet [id number] Yes Take 1 tablet (20 mg total) by mouth daily. Sheryle Donning, MD Taking Active   spironolactone  (ALDACTONE ) 25 MG tablet [id number] Yes Take 1 tablet by mouth once daily Arcadio Knuckles, MD Taking Active               Assessment/Plan:   Diabetes: - Currently uncontrolled, A1c goal <7% - Recommend to continue lifestyle modifications  - Discuss PAP option if need high cost medication for diabetes or CKD - pt notes he does not qualify - Pt would benefit from Jardiance  or Farxiga  for diabetes, chronic kidney disease, and cardiomyopathy however medication cost is an issue   Hypertension: - Currently  controlled, BP goal <130/80 - Recommend to check blood pressure to see if remains controlled. Notify us  if seeing BP >140/90s   Hyperlipidemia/ASCVD Risk Reduction: - Currently uncontrolled. LDL goal <70, TG goal <150. Has long hx of uncontrolled TG - Recommend to continue current regimen.  - Cardio f/u in July  Follow Up Plan: f/u after cardio appt  Rainelle Bur, PharmD, BCPS, CPP Clinical Pharmacist Practitioner [facility] [phone]

## 2023-08-21 NOTE — Patient Instructions (Signed)
 It was a pleasure speaking with you today!  Continue your current medication regimen. Continue monitoring your blood sugars and give me a call if your blood sugar average is above 150.  Feel free to call with any questions or concerns!  Rainelle Bur, PharmD, BCPS, CPP Clinical Pharmacist Practitioner [facility] [phone]

## 2023-10-18 ENCOUNTER — Telehealth: Payer: Self-pay | Admitting: Cardiology

## 2023-10-18 ENCOUNTER — Encounter: Payer: Self-pay | Admitting: Physician Assistant

## 2023-10-18 ENCOUNTER — Ambulatory Visit: Attending: Physician Assistant | Admitting: Physician Assistant

## 2023-10-18 VITALS — BP 120/80 | HR 71 | Ht 68.0 in | Wt 229.0 lb

## 2023-10-18 DIAGNOSIS — R0602 Shortness of breath: Secondary | ICD-10-CM

## 2023-10-18 DIAGNOSIS — I10 Essential (primary) hypertension: Secondary | ICD-10-CM

## 2023-10-18 DIAGNOSIS — R9389 Abnormal findings on diagnostic imaging of other specified body structures: Secondary | ICD-10-CM | POA: Diagnosis not present

## 2023-10-18 DIAGNOSIS — Z01818 Encounter for other preprocedural examination: Secondary | ICD-10-CM

## 2023-10-18 DIAGNOSIS — E119 Type 2 diabetes mellitus without complications: Secondary | ICD-10-CM

## 2023-10-18 DIAGNOSIS — I25111 Atherosclerotic heart disease of native coronary artery with angina pectoris with documented spasm: Secondary | ICD-10-CM | POA: Diagnosis not present

## 2023-10-18 DIAGNOSIS — R079 Chest pain, unspecified: Secondary | ICD-10-CM | POA: Diagnosis not present

## 2023-10-18 NOTE — H&P (View-Only) (Signed)
 b Cardiology Office Note   Date:  10/20/2023  ID:  Tim, Decker 31-Aug-1955, MRN [id number] PCP: Tim Debby CROME, MD  [facility] Providers Cardiologist:  Tim Bruckner, MD     History of Present Illness Tim Decker is a 68 y.o. male with past medical history of CAD, cardiomyopathy, hypertension, hyperlipidemia and DM II.  Echocardiogram in 2020 showed EF 35 to 40%, severe.  Myoview  in 2020 showed EF 44%, mild diffuse hypokinesis, small fixed defect in the apical area, no ischemia.  He declined Entresto due to cost.  EF normalized to 55 to 60% by February 2023.  Coronary CT obtained in 2025 showed calcium  score 1308 which placed the patient 91st percentile for age and sex matched control, severe mid LAD lesion, moderate D1 and left circumflex stenosis.  Patient was seen by Dr. Brion Decker for on 08/13/2023 at which time he denied any chest pain and has nonlimiting dyspnea on exertion.  Cardiac catheterization was discussed with the patient however given all major and high risk symptom, it was decided to focus on medical management first.  Patient presents today to [facility] as an add-on for follow-up and evaluation of shortness of breath with exertion.  He has occasional chest tightness however main issue is he is getting more more short of breath with less activity for the past several weeks.  He is also more fatigued than usual.  EKG showed no acute changes.  I recommended outpatient echocardiogram and proceed with cardiac catheterization.  I discussed his case with DOD Dr. Peter Decker who was agreeable with the plan.  Risk and benefit of the cardiac catheterization has been discussed with the patient who is agreeable to proceed.  Since we are getting the echocardiogram, he does not need LV gram during the procedure.  This is to minimize the risk of contrast nephropathy.  In the morning of the procedure he has to hold irbesartan  and spironolactone .  He will  need for IV hydration prior to the cardiac optimization.  I will see the patient back in 4 weeks after the cardiac apposition.  ROS:   Patient complains of fatigue, shortness of breath with activity and intermittent chest discomfort.  He has no lower extremity edema, orthopnea or PND.  Studies Reviewed EKG Interpretation Date/Time:  Thursday October 18 2023 14:04:27 EDT Ventricular Rate:  71 PR Interval:  186 QRS Duration:  104 QT Interval:  390 QTC Calculation: 423 R Axis:   -74  Text Interpretation: Normal sinus rhythm Left axis deviation Incomplete right bundle branch block When compared with ECG of 28-Mar-2023 08:51, No significant change was found Confirmed by Tim Decker [phone]) on 10/20/2023 7:33:55 PM    Cardiac Studies & Procedures   ______________________________________________________________________________________________   STRESS TESTS  MYOCARDIAL PERFUSION IMAGING 02/19/2019  Narrative  Nuclear stress EF: 44%. Mild diffuse hypokinesis  The left ventricular ejection fraction is mildly decreased (45-54%).  There was no ST segment deviation noted during stress.  Defect 1: There is a small defect of mild severity present in the apical inferior and apical lateral location. Fixed defect.  This is an intermediate risk study based upon reduced EF. Consider echocardiogram for verification. There is no ischemia identified.  Tim Parchment, MD   ECHOCARDIOGRAM  ECHOCARDIOGRAM COMPLETE 05/20/2021  Narrative ECHOCARDIOGRAM REPORT    Patient Name:   Tim Decker [facility] Date of Exam: 05/20/2021 Medical Rec #:  [id number]          Height:  69.0 in Accession #:    [id number]         Weight:       243.5 lb Date of Birth:  [birth year]          BSA:          2.246 m Patient Age:    65 years           BP:           130/84 mmHg Patient Gender: M                  HR:           65 bpm. Exam Location:  Outpatient  Procedure: 2D Echo, Cardiac Doppler, Color Doppler and Strain  Analysis  Indications:    R00.2 Palpitations; I42.9 Cardiomyopathy (unspecified)  History:        Patient has prior history of Echocardiogram examinations, most recent 02/26/2019. Cardiomyopathy; Risk Factors:Hypertension, Family History of Coronary Artery Disease, Diabetes, CKD stage 3b and Current Smoker. Patient denies chest pain, SOB and leg edema and has some palpitations intermittently.  Sonographer:    Annabella Cater RVT, RDCS (AE), RDMS Referring Phys: [phone] [facility] [name]   Sonographer Comments: Patient is morbidly obese and suboptimal parasternal window. Image acquisition challenging due to patient body habitus. IMPRESSIONS   1. Left ventricular ejection fraction, by estimation, is 55 to 60%. The left ventricle has normal function. The left ventricle has no regional wall motion abnormalities. There is mild concentric left ventricular hypertrophy. Left ventricular diastolic parameters are indeterminate. The average left ventricular global longitudinal strain is -13.5 %. The global longitudinal strain is abnormal. 2. Right ventricular systolic function is normal. The right ventricular size is normal. 3. Left atrial size was mildly dilated. 4. The mitral valve is grossly normal. Trivial mitral valve regurgitation. No evidence of mitral stenosis. 5. The aortic valve is grossly normal. There is mild calcification of the aortic valve. Aortic valve regurgitation is not visualized. No aortic stenosis is present. 6. Aortic dilatation noted. There is borderline dilatation of the ascending aorta, measuring 38 mm.  Comparison(s): Changes from prior study are noted. EF 35%, severe LVH, AOV calcifications mild-mod, GLS -15.9%.  Conclusion(s)/Recommendation(s): EF significantly improved (EF within normal limits, strain mildly abnormal) compared to prior.  FINDINGS Left Ventricle: Left ventricular ejection fraction, by estimation, is 55 to 60%. The left ventricle has normal function.  The left ventricle has no regional wall motion abnormalities. The average left ventricular global longitudinal strain is -13.5 %. The global longitudinal strain is abnormal. The left ventricular internal cavity size was normal in size. There is mild concentric left ventricular hypertrophy. Left ventricular diastolic parameters are indeterminate.  Right Ventricle: The right ventricular size is normal. No increase in right ventricular wall thickness. Right ventricular systolic function is normal.  Left Atrium: Left atrial size was mildly dilated.  Right Atrium: Right atrial size was normal in size.  Pericardium: There is no evidence of pericardial effusion.  Mitral Valve: The mitral valve is grossly normal. Trivial mitral valve regurgitation. No evidence of mitral valve stenosis.  Tricuspid Valve: The tricuspid valve is grossly normal. Tricuspid valve regurgitation is trivial. No evidence of tricuspid stenosis.  Aortic Valve: The aortic valve is grossly normal. There is mild calcification of the aortic valve. Aortic valve regurgitation is not visualized. No aortic stenosis is present. Aortic valve mean gradient measures 2.7 mmHg. Aortic valve peak gradient measures 6.2 mmHg. Aortic valve area, by VTI measures 3.13 cm.  Pulmonic  Valve: The pulmonic valve was not well visualized. Pulmonic valve regurgitation is not visualized. No evidence of pulmonic stenosis.  Aorta: Aortic dilatation noted. There is borderline dilatation of the ascending aorta, measuring 38 mm.  IAS/Shunts: The atrial septum is grossly normal.   LEFT VENTRICLE PLAX 2D LVIDd:         4.90 cm     Diastology LVIDs:         3.04 cm     LV e' medial:    5.98 cm/s LV PW:         1.27 cm     LV E/e' medial:  17.1 LV IVS:        1.25 cm     LV e' lateral:   8.59 cm/s LVOT diam:     2.10 cm     LV E/e' lateral: 11.9 LV SV:         71 LV SV Index:   32          2D Longitudinal Strain LVOT Area:     3.46 cm    2D Strain GLS  (A2C):   -14.3 % 2D Strain GLS (A3C):   -11.7 % 2D Strain GLS (A4C):   -14.6 % LV Volumes (MOD)           2D Strain GLS Avg:     -13.5 % LV vol d, MOD A2C: 64.4 ml LV vol d, MOD A4C: 79.1 ml LV vol s, MOD A2C: 27.6 ml LV vol s, MOD A4C: 22.0 ml 3D Volume EF: LV SV MOD A2C:     36.8 ml 3D EF:        57 % LV SV MOD A4C:     79.1 ml LV EDV:       102 ml LV SV MOD BP:      46.2 ml LV ESV:       44 ml LV SV:        58 ml  RIGHT VENTRICLE RV S prime:     12.60 cm/s TAPSE (M-mode): 2.3 cm  LEFT ATRIUM             Index        RIGHT ATRIUM           Index LA diam:        4.40 cm 1.96 cm/m   RA Area:     13.60 cm LA Vol (A2C):   57.4 ml 25.55 ml/m  RA Volume:   29.00 ml  12.91 ml/m LA Vol (A4C):   39.6 ml 17.63 ml/m LA Biplane Vol: 53.0 ml 23.60 ml/m AORTIC VALVE                    PULMONIC VALVE AV Area (Vmax):    2.88 cm     PV Vmax:       1.32 m/s AV Area (Vmean):   2.98 cm     PV Peak grad:  7.0 mmHg AV Area (VTI):     3.13 cm AV Vmax:           124.00 cm/s AV Vmean:          75.500 cm/s AV VTI:            0.228 m AV Peak Grad:      6.2 mmHg AV Mean Grad:      2.7 mmHg LVOT Vmax:         103.00 cm/s LVOT Vmean:  65.000 cm/s LVOT VTI:          0.206 m LVOT/AV VTI ratio: 0.90  AORTA Ao Root diam: 3.70 cm Ao Asc diam:  3.80 cm Ao Arch diam: 3.8 cm  MITRAL VALVE                TRICUSPID VALVE MV Area (PHT): 3.50 cm     TR Peak grad:   23.4 mmHg MV Decel Time: 217 msec     TR Vmax:        242.00 cm/s MV E velocity: 102.00 cm/s MV A velocity: 88.00 cm/s   SHUNTS MV E/A ratio:  1.16         Systemic VTI:  0.21 m Systemic Diam: 2.10 cm  Tim Bruckner MD Electronically signed by Tim Bruckner MD Signature Date/Time: 05/20/2021/1:19:52 PM    Final      CT SCANS  CT CORONARY MORPH W/CTA COR W/SCORE 07/17/2023  Addendum 07/28/2023  2:18 AM ADDENDUM REPORT: 07/28/2023 02:15  EXAM: OVER-READ INTERPRETATION  CT CHEST  The following report is  an over-read performed by radiologist Dr. Suzen Dials of [facility] on 07/28/2023. This over-read does not include interpretation of cardiac or coronary anatomy or pathology. The coronary calcium  score/coronary CTA interpretation by the cardiologist is attached.  COMPARISON:  October 25, 2022  FINDINGS: Cardiovascular: There are no significant extracardiac vascular findings.  Mediastinum/Nodes: There are no enlarged lymph nodes within the visualized mediastinum.  Lungs/Pleura: There is no pleural effusion. The visualized lungs appear clear.  Upper abdomen: No significant findings in the visualized upper abdomen.  Musculoskeletal/Chest wall: No chest wall mass or suspicious osseous findings within the visualized chest.  IMPRESSION: No significant extracardiac findings within the visualized chest.   Electronically Signed By: Suzen Dials M.D. On: 07/28/2023 02:15  Narrative CLINICAL DATA:  27M with hypertension, hyperlipidemia, prediabetes, tobacco abuse, and cardiomyopathy with abnormal EKG.  EXAM: Cardiac/Coronary  CT  TECHNIQUE: The patient was scanned on a [employer].  FINDINGS: A 120 kV prospective scan was triggered in the descending thoracic aorta at 111 HU's. Axial non-contrast 3 mm slices were carried out through the heart. The data set was analyzed on a dedicated work station and scored using the Agatson method. Gantry rotation speed was 250 msecs and collimation was .6 mm. No beta blockade and 0.8 mg of sl NTG was given. The 3D data set was reconstructed in 5% intervals of the 67-82 % of the R-R cycle. Diastolic phases were analyzed on a dedicated work station using MPR, MIP and VRT modes. The patient received 80 cc of contrast.  Aorta: Normal size. Ascending aorta 3.8 cm. Aortic atherosclerosis. No dissection.  Aortic Valve:  Trileaflet.  Mild calcification.  Coronary Arteries:  Normal coronary origin.  Right  dominance.  RCA is a large dominant artery that gives rise to PDA and PLVB. There is mild (25-49%) plaque in the mid RCA and minimal (<25%) mixed plaque distally.  Left main is a large artery that gives rise to LAD and LCX arteries. There is minimal (<25%) calcified plaque distally.  LAD is a large vessel that has mild (25-49%) calcified plaque proximally. There is severe (>70%) mixed plaque in the mid and distal LAD. D1 has moderate calcified stenosis proximally and severe stenosis in the mid vessel.  LCX is a non-dominant artery that gives rise to one large OM1 branch. There is moderate (50-69%) mixed plaque in the proximal LCX. The mid LCX is not interpretable due to  slab reconstruction artifact.  Coronary Calcium  Score:  Left main: 34.4  Left anterior descending artery: 798  Left circumflex artery: 334  Right coronary artery: 141  Total: 1,308  Percentile: 91st  Other findings:  Normal pulmonary vein drainage into the left atrium.  Normal let atrial appendage without a thrombus.  Normal size of the pulmonary artery.  Non-cardiac: See separate report from [facility].  IMPRESSION: 1. Coronary calcium  score of 1308. This was 91st percentile for age-, sex, and race-matched controls.  2. Total plaque volume was not assessed by HeartFlow due to artifact.  3. Normal coronary origin with right dominance.  4. There is severe (>70%) stenosis in the mid LAD. There is moderate (50-69%) stenosis in D1 and the LCX. CAD-RADS 4.  5. Study interpretation limited by reconstruction artifact and could not be submitted to HeartFlow.  6.  Recommend cardiac catheterization.  RECOMMENDATIONS: 1. CAD-RADS 0: No evidence of CAD (0%). Consider non-atherosclerotic causes of chest pain.  2. CAD-RADS 1: Minimal non-obstructive CAD (0-24%). Consider non-atherosclerotic causes of chest pain. Consider preventive therapy and risk factor modification.  3. CAD-RADS 2:  Mild non-obstructive CAD (25-49%). Consider non-atherosclerotic causes of chest pain. Consider preventive therapy and risk factor modification.  4. CAD-RADS 3: Moderate stenosis. Consider symptom-guided anti-ischemic pharmacotherapy as well as risk factor modification per guideline directed care. Additional analysis with CT FFR will be submitted.  5. CAD-RADS 4: Severe stenosis. (70-99% or > 50% left main). Cardiac catheterization or CT FFR is recommended. Consider symptom-guided anti-ischemic pharmacotherapy as well as risk factor modification per guideline directed care. Invasive coronary angiography recommended with revascularization per published guideline statements.  6. CAD-RADS 5: Total coronary occlusion (100%). Consider cardiac catheterization or viability assessment. Consider symptom-guided anti-ischemic pharmacotherapy as well as risk factor modification per guideline directed care.  7. CAD-RADS N: Non-diagnostic study. Obstructive CAD can't be excluded. Alternative evaluation is recommended.  Annabella Scarce, MD  Electronically Signed: By: Annabella Scarce M.D. On: 07/18/2023 17:32     ______________________________________________________________________________________________      Risk Assessment/Calculations          Physical Exam VS:  BP 120/80   Pulse 71   Ht 5' 8 (1.727 m)   Wt 229 lb (103.9 kg)   SpO2 97%   BMI 34.82 kg/m        Wt Readings from Last 3 Encounters:  10/18/23 229 lb (103.9 kg)  08/13/23 232 lb 11.2 oz (105.6 kg)  06/18/23 236 lb 12.8 oz (107.4 kg)    GEN: Well nourished, well developed in no acute distress NECK: No JVD; No carotid bruits CARDIAC: RRR, no murmurs, rubs, gallops RESPIRATORY:  Clear to auscultation without rales, wheezing or rhonchi  ABDOMEN: Soft, non-tender, non-distended EXTREMITIES:  No edema; No deformity   ASSESSMENT AND PLAN  Shortness of breath and chest pain: Coronary CT obtained earlier this  year was abnormal with significant LAD disease and at least moderate left circumflex lesion.  He opted for medical therapy at that time which was reasonable given lack of symptom.  However in the past few weeks, he has been having worsening fatigue, shortness of breath with exertion and chest discomfort.  At this time, we recommend proceed with cardiac catheterization for definitive evaluation.  He will need 4 hours of IV hydration given poor renal function.  Obtain basic metabolic panel and a CBC today.  No LV gram.  CAD: See #1, significant mid LAD lesion and moderate disease in left circumflex artery  Hypertension: Blood pressure  stable  Hyperlipidemia: Continue fenofibrate  and rosuvastatin   DM2: Managed by primary care provider.  CKD stage III: Obtain basic metabolic panel.  No LV gram during cardiac authorization.  4 hours IV hydration prior to cardiac catheterization.  Risk of potential renal injury discussed with the patient.    Informed Consent   Shared Decision Making/Informed Consent The risks [stroke (1 in 1000), death (1 in 1000), kidney failure [usually temporary] (1 in 500), bleeding (1 in 200), allergic reaction [possibly serious] (1 in 200)], benefits (diagnostic support and management of coronary artery disease) and alternatives of a cardiac catheterization were discussed in detail with Mr. Mathey and he is willing to proceed.     Dispo: Follow-up in 4 weeks.  Signed, Scot Ford, PA

## 2023-10-18 NOTE — Progress Notes (Signed)
 b Cardiology Office Note   Date:  10/20/2023  ID:  Tim, Decker 31-Aug-1955, MRN [id number] PCP: Tim Debby CROME, MD  [facility] Providers Cardiologist:  Shelda Bruckner, MD     History of Present Illness Tim Decker is a 68 y.o. male with past medical history of CAD, cardiomyopathy, hypertension, hyperlipidemia and DM II.  Echocardiogram in 2020 showed EF 35 to 40%, severe.  Myoview  in 2020 showed EF 44%, mild diffuse hypokinesis, small fixed defect in the apical area, no ischemia.  He declined Entresto due to cost.  EF normalized to 55 to 60% by February 2023.  Coronary CT obtained in 2025 showed calcium  score 1308 which placed the patient 91st percentile for age and sex matched control, severe mid LAD lesion, moderate D1 and left circumflex stenosis.  Patient was seen by Dr. Brion request for on 08/13/2023 at which time he denied any chest pain and has nonlimiting dyspnea on exertion.  Cardiac catheterization was discussed with the patient however given all major and high risk symptom, it was decided to focus on medical management first.  [facility] as an add-on for follow-up and evaluation of shortness of breath with exertion.  He has occasional chest tightness however main issue is he is getting more more short of breath with less activity for the past several weeks.  He is also more fatigued than usual.  EKG showed no acute changes.  I recommended outpatient echocardiogram and proceed with cardiac catheterization.  I discussed his case with DOD Dr. Peter [place] who was agreeable with the plan.  Risk and benefit of the cardiac catheterization has been discussed with the patient who is agreeable to proceed.  Since we are getting the echocardiogram, he does not need LV gram during the procedure.  This is to minimize the risk of contrast nephropathy.  In the morning of the procedure he has to hold irbesartan  and spironolactone .  He will  need for IV hydration prior to the cardiac optimization.  I will see the patient back in 4 weeks after the cardiac apposition.  ROS:   Patient complains of fatigue, shortness of breath with activity and intermittent chest discomfort.  He has no lower extremity edema, orthopnea or PND.  Studies Reviewed EKG Interpretation Date/Time:  Thursday October 18 2023 14:04:27 EDT Ventricular Rate:  71 PR Interval:  186 QRS Duration:  104 QT Interval:  390 QTC Calculation: 423 R Axis:   -74  Text Interpretation: Normal sinus rhythm Left axis deviation Incomplete right bundle branch block When compared with ECG of 28-Mar-2023 08:51, No significant change was found Confirmed by [name] [phone]) on 10/20/2023 7:33:55 PM    Cardiac Studies & Procedures   ______________________________________________________________________________________________   STRESS TESTS  MYOCARDIAL PERFUSION IMAGING 02/19/2019  Narrative  Nuclear stress EF: 44%. Mild diffuse hypokinesis  The left ventricular ejection fraction is mildly decreased (45-54%).  There was no ST segment deviation noted during stress.  Defect 1: There is a small defect of mild severity present in the apical inferior and apical lateral location. Fixed defect.  This is an intermediate risk study based upon reduced EF. Consider echocardiogram for verification. There is no ischemia identified.  [name], MD   ECHOCARDIOGRAM  ECHOCARDIOGRAM COMPLETE 05/20/2021  Narrative ECHOCARDIOGRAM REPORT    Patient Name:   Tim Decker [facility] Date of Exam: 05/20/2021 Medical Rec #:  [id number]          Height:  69.0 in Accession #:    [id number]         Weight:       243.5 lb Date of Birth:  [birth year]          BSA:          2.246 m Patient Age:    65 years           BP:           130/84 mmHg Patient Gender: M                  HR:           65 bpm. Exam Location:  Outpatient  Procedure: 2D Echo, Cardiac Doppler, Color Doppler and Strain  Analysis  Indications:    R00.2 Palpitations; I42.9 Cardiomyopathy (unspecified)  History:        Patient has prior history of Echocardiogram examinations, most recent 02/26/2019. Cardiomyopathy; Risk Factors:Hypertension, Family History of Coronary Artery Disease, Diabetes, CKD stage 3b and Current Smoker. Patient denies chest pain, SOB and leg edema and has some palpitations intermittently.  Sonographer:    Annabella Cater RVT, RDCS (AE), RDMS Referring Phys: [phone] [facility] [name]   Sonographer Comments: Patient is morbidly obese and suboptimal parasternal window. Image acquisition challenging due to patient body habitus. IMPRESSIONS   1. Left ventricular ejection fraction, by estimation, is 55 to 60%. The left ventricle has normal function. The left ventricle has no regional wall motion abnormalities. There is mild concentric left ventricular hypertrophy. Left ventricular diastolic parameters are indeterminate. The average left ventricular global longitudinal strain is -13.5 %. The global longitudinal strain is abnormal. 2. Right ventricular systolic function is normal. The right ventricular size is normal. 3. Left atrial size was mildly dilated. 4. The mitral valve is grossly normal. Trivial mitral valve regurgitation. No evidence of mitral stenosis. 5. The aortic valve is grossly normal. There is mild calcification of the aortic valve. Aortic valve regurgitation is not visualized. No aortic stenosis is present. 6. Aortic dilatation noted. There is borderline dilatation of the ascending aorta, measuring 38 mm.  Comparison(s): Changes from prior study are noted. EF 35%, severe LVH, AOV calcifications mild-mod, GLS -15.9%.  Conclusion(s)/Recommendation(s): EF significantly improved (EF within normal limits, strain mildly abnormal) compared to prior.  FINDINGS Left Ventricle: Left ventricular ejection fraction, by estimation, is 55 to 60%. The left ventricle has normal function.  The left ventricle has no regional wall motion abnormalities. The average left ventricular global longitudinal strain is -13.5 %. The global longitudinal strain is abnormal. The left ventricular internal cavity size was normal in size. There is mild concentric left ventricular hypertrophy. Left ventricular diastolic parameters are indeterminate.  Right Ventricle: The right ventricular size is normal. No increase in right ventricular wall thickness. Right ventricular systolic function is normal.  Left Atrium: Left atrial size was mildly dilated.  Right Atrium: Right atrial size was normal in size.  Pericardium: There is no evidence of pericardial effusion.  Mitral Valve: The mitral valve is grossly normal. Trivial mitral valve regurgitation. No evidence of mitral valve stenosis.  Tricuspid Valve: The tricuspid valve is grossly normal. Tricuspid valve regurgitation is trivial. No evidence of tricuspid stenosis.  Aortic Valve: The aortic valve is grossly normal. There is mild calcification of the aortic valve. Aortic valve regurgitation is not visualized. No aortic stenosis is present. Aortic valve mean gradient measures 2.7 mmHg. Aortic valve peak gradient measures 6.2 mmHg. Aortic valve area, by VTI measures 3.13 cm.  Pulmonic  Valve: The pulmonic valve was not well visualized. Pulmonic valve regurgitation is not visualized. No evidence of pulmonic stenosis.  Aorta: Aortic dilatation noted. There is borderline dilatation of the ascending aorta, measuring 38 mm.  IAS/Shunts: The atrial septum is grossly normal.   LEFT VENTRICLE PLAX 2D LVIDd:         4.90 cm     Diastology LVIDs:         3.04 cm     LV e' medial:    5.98 cm/s LV PW:         1.27 cm     LV E/e' medial:  17.1 LV IVS:        1.25 cm     LV e' lateral:   8.59 cm/s LVOT diam:     2.10 cm     LV E/e' lateral: 11.9 LV SV:         71 LV SV Index:   32          2D Longitudinal Strain LVOT Area:     3.46 cm    2D Strain GLS  (A2C):   -14.3 % 2D Strain GLS (A3C):   -11.7 % 2D Strain GLS (A4C):   -14.6 % LV Volumes (MOD)           2D Strain GLS Avg:     -13.5 % LV vol d, MOD A2C: 64.4 ml LV vol d, MOD A4C: 79.1 ml LV vol s, MOD A2C: 27.6 ml LV vol s, MOD A4C: 22.0 ml 3D Volume EF: LV SV MOD A2C:     36.8 ml 3D EF:        57 % LV SV MOD A4C:     79.1 ml LV EDV:       102 ml LV SV MOD BP:      46.2 ml LV ESV:       44 ml LV SV:        58 ml  RIGHT VENTRICLE RV S prime:     12.60 cm/s TAPSE (M-mode): 2.3 cm  LEFT ATRIUM             Index        RIGHT ATRIUM           Index LA diam:        4.40 cm 1.96 cm/m   RA Area:     13.60 cm LA Vol (A2C):   57.4 ml 25.55 ml/m  RA Volume:   29.00 ml  12.91 ml/m LA Vol (A4C):   39.6 ml 17.63 ml/m LA Biplane Vol: 53.0 ml 23.60 ml/m AORTIC VALVE                    PULMONIC VALVE AV Area (Vmax):    2.88 cm     PV Vmax:       1.32 m/s AV Area (Vmean):   2.98 cm     PV Peak grad:  7.0 mmHg AV Area (VTI):     3.13 cm AV Vmax:           124.00 cm/s AV Vmean:          75.500 cm/s AV VTI:            0.228 m AV Peak Grad:      6.2 mmHg AV Mean Grad:      2.7 mmHg LVOT Vmax:         103.00 cm/s LVOT Vmean:  65.000 cm/s LVOT VTI:          0.206 m LVOT/AV VTI ratio: 0.90  AORTA Ao Root diam: 3.70 cm Ao Asc diam:  3.80 cm Ao Arch diam: 3.8 cm  MITRAL VALVE                TRICUSPID VALVE MV Area (PHT): 3.50 cm     TR Peak grad:   23.4 mmHg MV Decel Time: 217 msec     TR Vmax:        242.00 cm/s MV E velocity: 102.00 cm/s MV A velocity: 88.00 cm/s   SHUNTS MV E/A ratio:  1.16         Systemic VTI:  0.21 m Systemic Diam: 2.10 cm  Shelda Bruckner MD Electronically signed by Shelda Bruckner MD Signature Date/Time: 05/20/2021/1:19:52 PM    Final      CT SCANS  CT CORONARY MORPH W/CTA COR W/SCORE 07/17/2023  Addendum 07/28/2023  2:18 AM ADDENDUM REPORT: 07/28/2023 02:15  EXAM: OVER-READ INTERPRETATION  CT CHEST  The following report is  an over-read performed by radiologist Dr. Suzen Dials of [facility] on 07/28/2023. This over-read does not include interpretation of cardiac or coronary anatomy or pathology. The coronary calcium  score/coronary CTA interpretation by the cardiologist is attached.  COMPARISON:  October 25, 2022  FINDINGS: Cardiovascular: There are no significant extracardiac vascular findings.  Mediastinum/Nodes: There are no enlarged lymph nodes within the visualized mediastinum.  Lungs/Pleura: There is no pleural effusion. The visualized lungs appear clear.  Upper abdomen: No significant findings in the visualized upper abdomen.  Musculoskeletal/Chest wall: No chest wall mass or suspicious osseous findings within the visualized chest.  IMPRESSION: No significant extracardiac findings within the visualized chest.   Electronically Signed By: Suzen Dials M.D. On: 07/28/2023 02:15  Narrative CLINICAL DATA:  27M with hypertension, hyperlipidemia, prediabetes, tobacco abuse, and cardiomyopathy with abnormal EKG.  EXAM: Cardiac/Coronary  CT  TECHNIQUE: The patient was scanned on a [employer].  FINDINGS: A 120 kV prospective scan was triggered in the descending thoracic aorta at 111 HU's. Axial non-contrast 3 mm slices were carried out through the heart. The data set was analyzed on a dedicated work station and scored using the Agatson method. Gantry rotation speed was 250 msecs and collimation was .6 mm. No beta blockade and 0.8 mg of sl NTG was given. The 3D data set was reconstructed in 5% intervals of the 67-82 % of the R-R cycle. Diastolic phases were analyzed on a dedicated work station using MPR, MIP and VRT modes. The patient received 80 cc of contrast.  Aorta: Normal size. Ascending aorta 3.8 cm. Aortic atherosclerosis. No dissection.  Aortic Valve:  Trileaflet.  Mild calcification.  Coronary Arteries:  Normal coronary origin.  Right  dominance.  RCA is a large dominant artery that gives rise to PDA and PLVB. There is mild (25-49%) plaque in the mid RCA and minimal (<25%) mixed plaque distally.  Left main is a large artery that gives rise to LAD and LCX arteries. There is minimal (<25%) calcified plaque distally.  LAD is a large vessel that has mild (25-49%) calcified plaque proximally. There is severe (>70%) mixed plaque in the mid and distal LAD. D1 has moderate calcified stenosis proximally and severe stenosis in the mid vessel.  LCX is a non-dominant artery that gives rise to one large OM1 branch. There is moderate (50-69%) mixed plaque in the proximal LCX. The mid LCX is not interpretable due to  slab reconstruction artifact.  Coronary Calcium  Score:  Left main: 34.4  Left anterior descending artery: 798  Left circumflex artery: 334  Right coronary artery: 141  Total: 1,308  Percentile: 91st  Other findings:  Normal pulmonary vein drainage into the left atrium.  Normal let atrial appendage without a thrombus.  Normal size of the pulmonary artery.  Non-cardiac: See separate report from [facility].  IMPRESSION: 1. Coronary calcium  score of 1308. This was 91st percentile for age-, sex, and race-matched controls.  2. Total plaque volume was not assessed by HeartFlow due to artifact.  3. Normal coronary origin with right dominance.  4. There is severe (>70%) stenosis in the mid LAD. There is moderate (50-69%) stenosis in D1 and the LCX. CAD-RADS 4.  5. Study interpretation limited by reconstruction artifact and could not be submitted to [facility].  6.  Recommend cardiac catheterization.  RECOMMENDATIONS: 1. CAD-RADS 0: No evidence of CAD (0%). Consider non-atherosclerotic causes of chest pain.  2. CAD-RADS 1: Minimal non-obstructive CAD (0-24%). Consider non-atherosclerotic causes of chest pain. Consider preventive therapy and risk factor modification.  3. CAD-RADS 2:  Mild non-obstructive CAD (25-49%). Consider non-atherosclerotic causes of chest pain. Consider preventive therapy and risk factor modification.  4. CAD-RADS 3: Moderate stenosis. Consider symptom-guided anti-ischemic pharmacotherapy as well as risk factor modification per guideline directed care. Additional analysis with CT FFR will be submitted.  5. CAD-RADS 4: Severe stenosis. (70-99% or > 50% left main). Cardiac catheterization or CT FFR is recommended. Consider symptom-guided anti-ischemic pharmacotherapy as well as risk factor modification per guideline directed care. Invasive coronary angiography recommended with revascularization per published guideline statements.  6. CAD-RADS 5: Total coronary occlusion (100%). Consider cardiac catheterization or viability assessment. Consider symptom-guided anti-ischemic pharmacotherapy as well as risk factor modification per guideline directed care.  7. CAD-RADS N: Non-diagnostic study. Obstructive CAD can't be excluded. Alternative evaluation is recommended.  Annabella Scarce, MD  Electronically Signed: By: Annabella Scarce M.D. On: 07/18/2023 17:32     ______________________________________________________________________________________________      Risk Assessment/Calculations          Physical Exam VS:  BP 120/80   Pulse 71   Ht 5' 8 (1.727 m)   Wt 229 lb (103.9 kg)   SpO2 97%   BMI 34.82 kg/m        Wt Readings from Last 3 Encounters:  10/18/23 229 lb (103.9 kg)  08/13/23 232 lb 11.2 oz (105.6 kg)  06/18/23 236 lb 12.8 oz (107.4 kg)    GEN: Well nourished, well developed in no acute distress NECK: No JVD; No carotid bruits CARDIAC: RRR, no murmurs, rubs, gallops RESPIRATORY:  Clear to auscultation without rales, wheezing or rhonchi  ABDOMEN: Soft, non-tender, non-distended EXTREMITIES:  No edema; No deformity   ASSESSMENT AND PLAN  Shortness of breath and chest pain: Coronary CT obtained earlier this  year was abnormal with significant LAD disease and at least moderate left circumflex lesion.  He opted for medical therapy at that time which was reasonable given lack of symptom.  However in the past few weeks, he has been having worsening fatigue, shortness of breath with exertion and chest discomfort.  At this time, we recommend proceed with cardiac catheterization for definitive evaluation.  He will need 4 hours of IV hydration given poor renal function.  Obtain basic metabolic panel and a CBC today.  No LV gram.  CAD: See #1, significant mid LAD lesion and moderate disease in left circumflex artery  Hypertension: Blood pressure  stable  Hyperlipidemia: Continue fenofibrate  and rosuvastatin   DM2: Managed by primary care provider.  CKD stage III: Obtain basic metabolic panel.  No LV gram during cardiac authorization.  4 hours IV hydration prior to cardiac catheterization.  Risk of potential renal injury discussed with the patient.    Informed Consent   Shared Decision Making/Informed Consent The risks [stroke (1 in 1000), death (1 in 1000), kidney failure [usually temporary] (1 in 500), bleeding (1 in 200), allergic reaction [possibly serious] (1 in 200)], benefits (diagnostic support and management of coronary artery disease) and alternatives of a cardiac catheterization were discussed in detail with Tim Decker and he is willing to proceed.     Dispo: Follow-up in 4 weeks.  Signed, Scot Ford, PA

## 2023-10-18 NOTE — Telephone Encounter (Signed)
  Per MyChart scheduling message;  Pt c/o Shortness Of Breath: STAT if SOB developed within the last 24 hours or pt is noticeably SOB on the phone  1. Are you currently SOB (can you hear that pt is SOB on the phone)?   2. How long have you been experiencing SOB?   3. Are you SOB when sitting or when up moving around?   4. Are you currently experiencing any other symptoms?    I HAVE BEEN VERY WEAK AND SHORT WINDED JUST NOT FEELING GOOD FOR ABOUT 2 WEEKS  I GOT IT SHORT OF BREATH. ABOUT A WEEK OR LONGER, ALSO SOME CHEST PAIN NOT BAD I AM MOVEING AROUND BUT WEAK

## 2023-10-18 NOTE — H&P (View-Only) (Signed)
 b Cardiology Office Note   Date:  10/20/2023  ID:  Tim, Decker 31-Aug-1955, MRN [id number] PCP: [name], MD  [facility] Providers Cardiologist:  Shelda Bruckner, MD     History of Present Illness Tim Decker is a 68 y.o. male with past medical history of CAD, cardiomyopathy, hypertension, hyperlipidemia and DM II.  Echocardiogram in 2020 showed EF 35 to 40%, severe.  Myoview  in 2020 showed EF 44%, mild diffuse hypokinesis, small fixed defect in the apical area, no ischemia.  He declined Entresto due to cost.  EF normalized to 55 to 60% by February 2023.  Coronary CT obtained in 2025 showed calcium  score 1308 which placed the patient 91st percentile for age and sex matched control, severe mid LAD lesion, moderate D1 and left circumflex stenosis.  Patient was seen by Dr. Brion request for on 08/13/2023 at which time he denied any chest pain and has nonlimiting dyspnea on exertion.  Cardiac catheterization was discussed with the patient however given all major and high risk symptom, it was decided to focus on medical management first.  Patient presents today to [facility] clinic as an add-on for follow-up and evaluation of shortness of breath with exertion.  He has occasional chest tightness however main issue is he is getting more more short of breath with less activity for the past several weeks.  He is also more fatigued than usual.  EKG showed no acute changes.  I recommended outpatient echocardiogram and proceed with cardiac catheterization.  I discussed his case with DOD Dr. Peter Swaziland who was agreeable with the plan.  Risk and benefit of the cardiac catheterization has been discussed with the patient who is agreeable to proceed.  Since we are getting the echocardiogram, he does not need LV gram during the procedure.  This is to minimize the risk of contrast nephropathy.  In the morning of the procedure he has to hold irbesartan  and spironolactone .  He will  need for IV hydration prior to the cardiac optimization.  I will see the patient back in 4 weeks after the cardiac apposition.  ROS:   Patient complains of fatigue, shortness of breath with activity and intermittent chest discomfort.  He has no lower extremity edema, orthopnea or PND.  Studies Reviewed EKG Interpretation Date/Time:  Thursday October 18 2023 14:04:27 EDT Ventricular Rate:  71 PR Interval:  186 QRS Duration:  104 QT Interval:  390 QTC Calculation: 423 R Axis:   -74  Text Interpretation: Normal sinus rhythm Left axis deviation Incomplete right bundle branch block When compared with ECG of 28-Mar-2023 08:51, No significant change was found Confirmed by [name] [phone]) on 10/20/2023 7:33:55 PM    Cardiac Studies & Procedures   ______________________________________________________________________________________________   STRESS TESTS  MYOCARDIAL PERFUSION IMAGING 02/19/2019  Narrative  Nuclear stress EF: 44%. Mild diffuse hypokinesis  The left ventricular ejection fraction is mildly decreased (45-54%).  There was no ST segment deviation noted during stress.  Defect 1: There is a small defect of mild severity present in the apical inferior and apical lateral location. Fixed defect.  This is an intermediate risk study based upon reduced EF. Consider echocardiogram for verification. There is no ischemia identified.  [name], MD   ECHOCARDIOGRAM  ECHOCARDIOGRAM COMPLETE 05/20/2021  Narrative ECHOCARDIOGRAM REPORT    Patient Name:   Tim Decker [facility] Date of Exam: 05/20/2021 Medical Rec #:  [id number]          Height:  69.0 in Accession #:    [id number]         Weight:       243.5 lb Date of Birth:  [birth year]          BSA:          2.246 m Patient Age:    65 years           BP:           130/84 mmHg Patient Gender: M                  HR:           65 bpm. Exam Location:  Outpatient  Procedure: 2D Echo, Cardiac Doppler, Color Doppler and Strain  Analysis  Indications:    R00.2 Palpitations; I42.9 Cardiomyopathy (unspecified)  History:        Patient has prior history of Echocardiogram examinations, most recent 02/26/2019. Cardiomyopathy; Risk Factors:Hypertension, Family History of Coronary Artery Disease, Diabetes, CKD stage 3b and Current Smoker. Patient denies chest pain, SOB and leg edema and has some palpitations intermittently.  Sonographer:    Annabella Cater RVT, RDCS (AE), RDMS Referring Phys: [phone] [facility] [name]   Sonographer Comments: Patient is morbidly obese and suboptimal parasternal window. Image acquisition challenging due to patient body habitus. IMPRESSIONS   1. Left ventricular ejection fraction, by estimation, is 55 to 60%. The left ventricle has normal function. The left ventricle has no regional wall motion abnormalities. There is mild concentric left ventricular hypertrophy. Left ventricular diastolic parameters are indeterminate. The average left ventricular global longitudinal strain is -13.5 %. The global longitudinal strain is abnormal. 2. Right ventricular systolic function is normal. The right ventricular size is normal. 3. Left atrial size was mildly dilated. 4. The mitral valve is grossly normal. Trivial mitral valve regurgitation. No evidence of mitral stenosis. 5. The aortic valve is grossly normal. There is mild calcification of the aortic valve. Aortic valve regurgitation is not visualized. No aortic stenosis is present. 6. Aortic dilatation noted. There is borderline dilatation of the ascending aorta, measuring 38 mm.  Comparison(s): Changes from prior study are noted. EF 35%, severe LVH, AOV calcifications mild-mod, GLS -15.9%.  Conclusion(s)/Recommendation(s): EF significantly improved (EF within normal limits, strain mildly abnormal) compared to prior.  FINDINGS Left Ventricle: Left ventricular ejection fraction, by estimation, is 55 to 60%. The left ventricle has normal function.  The left ventricle has no regional wall motion abnormalities. The average left ventricular global longitudinal strain is -13.5 %. The global longitudinal strain is abnormal. The left ventricular internal cavity size was normal in size. There is mild concentric left ventricular hypertrophy. Left ventricular diastolic parameters are indeterminate.  Right Ventricle: The right ventricular size is normal. No increase in right ventricular wall thickness. Right ventricular systolic function is normal.  Left Atrium: Left atrial size was mildly dilated.  Right Atrium: Right atrial size was normal in size.  Pericardium: There is no evidence of pericardial effusion.  Mitral Valve: The mitral valve is grossly normal. Trivial mitral valve regurgitation. No evidence of mitral valve stenosis.  Tricuspid Valve: The tricuspid valve is grossly normal. Tricuspid valve regurgitation is trivial. No evidence of tricuspid stenosis.  Aortic Valve: The aortic valve is grossly normal. There is mild calcification of the aortic valve. Aortic valve regurgitation is not visualized. No aortic stenosis is present. Aortic valve mean gradient measures 2.7 mmHg. Aortic valve peak gradient measures 6.2 mmHg. Aortic valve area, by VTI measures 3.13 cm.  Pulmonic  Valve: The pulmonic valve was not well visualized. Pulmonic valve regurgitation is not visualized. No evidence of pulmonic stenosis.  Aorta: Aortic dilatation noted. There is borderline dilatation of the ascending aorta, measuring 38 mm.  IAS/Shunts: The atrial septum is grossly normal.   LEFT VENTRICLE PLAX 2D LVIDd:         4.90 cm     Diastology LVIDs:         3.04 cm     LV e' medial:    5.98 cm/s LV PW:         1.27 cm     LV E/e' medial:  17.1 LV IVS:        1.25 cm     LV e' lateral:   8.59 cm/s LVOT diam:     2.10 cm     LV E/e' lateral: 11.9 LV SV:         71 LV SV Index:   32          2D Longitudinal Strain LVOT Area:     3.46 cm    2D Strain GLS  (A2C):   -14.3 % 2D Strain GLS (A3C):   -11.7 % 2D Strain GLS (A4C):   -14.6 % LV Volumes (MOD)           2D Strain GLS Avg:     -13.5 % LV vol d, MOD A2C: 64.4 ml LV vol d, MOD A4C: 79.1 ml LV vol s, MOD A2C: 27.6 ml LV vol s, MOD A4C: 22.0 ml 3D Volume EF: LV SV MOD A2C:     36.8 ml 3D EF:        57 % LV SV MOD A4C:     79.1 ml LV EDV:       102 ml LV SV MOD BP:      46.2 ml LV ESV:       44 ml LV SV:        58 ml  RIGHT VENTRICLE RV S prime:     12.60 cm/s TAPSE (M-mode): 2.3 cm  LEFT ATRIUM             Index        RIGHT ATRIUM           Index LA diam:        4.40 cm 1.96 cm/m   RA Area:     13.60 cm LA Vol (A2C):   57.4 ml 25.55 ml/m  RA Volume:   29.00 ml  12.91 ml/m LA Vol (A4C):   39.6 ml 17.63 ml/m LA Biplane Vol: 53.0 ml 23.60 ml/m AORTIC VALVE                    PULMONIC VALVE AV Area (Vmax):    2.88 cm     PV Vmax:       1.32 m/s AV Area (Vmean):   2.98 cm     PV Peak grad:  7.0 mmHg AV Area (VTI):     3.13 cm AV Vmax:           124.00 cm/s AV Vmean:          75.500 cm/s AV VTI:            0.228 m AV Peak Grad:      6.2 mmHg AV Mean Grad:      2.7 mmHg LVOT Vmax:         103.00 cm/s LVOT Vmean:  65.000 cm/s LVOT VTI:          0.206 m LVOT/AV VTI ratio: 0.90  AORTA Ao Root diam: 3.70 cm Ao Asc diam:  3.80 cm Ao Arch diam: 3.8 cm  MITRAL VALVE                TRICUSPID VALVE MV Area (PHT): 3.50 cm     TR Peak grad:   23.4 mmHg MV Decel Time: 217 msec     TR Vmax:        242.00 cm/s MV E velocity: 102.00 cm/s MV A velocity: 88.00 cm/s   SHUNTS MV E/A ratio:  1.16         Systemic VTI:  0.21 m Systemic Diam: 2.10 cm  Shelda Bruckner MD Electronically signed by Shelda Bruckner MD Signature Date/Time: 05/20/2021/1:19:52 PM    Final      CT SCANS  CT CORONARY MORPH W/CTA COR W/SCORE 07/17/2023  Addendum 07/28/2023  2:18 AM ADDENDUM REPORT: 07/28/2023 02:15  EXAM: OVER-READ INTERPRETATION  CT CHEST  The following report is  an over-read performed by radiologist Dr. Suzen Dials of [facility] on 07/28/2023. This over-read does not include interpretation of cardiac or coronary anatomy or pathology. The coronary calcium  score/coronary CTA interpretation by the cardiologist is attached.  COMPARISON:  October 25, 2022  FINDINGS: Cardiovascular: There are no significant extracardiac vascular findings.  Mediastinum/Nodes: There are no enlarged lymph nodes within the visualized mediastinum.  Lungs/Pleura: There is no pleural effusion. The visualized lungs appear clear.  Upper abdomen: No significant findings in the visualized upper abdomen.  Musculoskeletal/Chest wall: No chest wall mass or suspicious osseous findings within the visualized chest.  IMPRESSION: No significant extracardiac findings within the visualized chest.   Electronically Signed By: Suzen Dials M.D. On: 07/28/2023 02:15  Narrative CLINICAL DATA:  27M with hypertension, hyperlipidemia, prediabetes, tobacco abuse, and cardiomyopathy with abnormal EKG.  EXAM: Cardiac/Coronary  CT  TECHNIQUE: The patient was scanned on a [employer].  FINDINGS: A 120 kV prospective scan was triggered in the descending thoracic aorta at 111 HU's. Axial non-contrast 3 mm slices were carried out through the heart. The data set was analyzed on a dedicated work station and scored using the Agatson method. Gantry rotation speed was 250 msecs and collimation was .6 mm. No beta blockade and 0.8 mg of sl NTG was given. The 3D data set was reconstructed in 5% intervals of the 67-82 % of the R-R cycle. Diastolic phases were analyzed on a dedicated work station using MPR, MIP and VRT modes. The patient received 80 cc of contrast.  Aorta: Normal size. Ascending aorta 3.8 cm. Aortic atherosclerosis. No dissection.  Aortic Valve:  Trileaflet.  Mild calcification.  Coronary Arteries:  Normal coronary origin.  Right  dominance.  RCA is a large dominant artery that gives rise to PDA and PLVB. There is mild (25-49%) plaque in the mid RCA and minimal (<25%) mixed plaque distally.  Left main is a large artery that gives rise to LAD and LCX arteries. There is minimal (<25%) calcified plaque distally.  LAD is a large vessel that has mild (25-49%) calcified plaque proximally. There is severe (>70%) mixed plaque in the mid and distal LAD. D1 has moderate calcified stenosis proximally and severe stenosis in the mid vessel.  LCX is a non-dominant artery that gives rise to one large OM1 branch. There is moderate (50-69%) mixed plaque in the proximal LCX. The mid LCX is not interpretable due to  slab reconstruction artifact.  Coronary Calcium  Score:  Left main: 34.4  Left anterior descending artery: 798  Left circumflex artery: 334  Right coronary artery: 141  Total: 1,308  Percentile: 91st  Other findings:  Normal pulmonary vein drainage into the left atrium.  Normal let atrial appendage without a thrombus.  Normal size of the pulmonary artery.  Non-cardiac: See separate report from [facility].  IMPRESSION: 1. Coronary calcium  score of 1308. This was 91st percentile for age-, sex, and race-matched controls.  2. Total plaque volume was not assessed by HeartFlow due to artifact.  3. Normal coronary origin with right dominance.  4. There is severe (>70%) stenosis in the mid LAD. There is moderate (50-69%) stenosis in D1 and the LCX. CAD-RADS 4.  5. Study interpretation limited by reconstruction artifact and could not be submitted to HeartFlow.  6.  Recommend cardiac catheterization.  RECOMMENDATIONS: 1. CAD-RADS 0: No evidence of CAD (0%). Consider non-atherosclerotic causes of chest pain.  2. CAD-RADS 1: Minimal non-obstructive CAD (0-24%). Consider non-atherosclerotic causes of chest pain. Consider preventive therapy and risk factor modification.  3. CAD-RADS 2:  Mild non-obstructive CAD (25-49%). Consider non-atherosclerotic causes of chest pain. Consider preventive therapy and risk factor modification.  4. CAD-RADS 3: Moderate stenosis. Consider symptom-guided anti-ischemic pharmacotherapy as well as risk factor modification per guideline directed care. Additional analysis with CT FFR will be submitted.  5. CAD-RADS 4: Severe stenosis. (70-99% or > 50% left main). Cardiac catheterization or CT FFR is recommended. Consider symptom-guided anti-ischemic pharmacotherapy as well as risk factor modification per guideline directed care. Invasive coronary angiography recommended with revascularization per published guideline statements.  6. CAD-RADS 5: Total coronary occlusion (100%). Consider cardiac catheterization or viability assessment. Consider symptom-guided anti-ischemic pharmacotherapy as well as risk factor modification per guideline directed care.  7. CAD-RADS N: Non-diagnostic study. Obstructive CAD can't be excluded. Alternative evaluation is recommended.  Annabella Scarce, MD  Electronically Signed: By: Annabella Scarce M.D. On: 07/18/2023 17:32     ______________________________________________________________________________________________      Risk Assessment/Calculations          Physical Exam VS:  BP 120/80   Pulse 71   Ht 5' 8 (1.727 m)   Wt 229 lb (103.9 kg)   SpO2 97%   BMI 34.82 kg/m        Wt Readings from Last 3 Encounters:  10/18/23 229 lb (103.9 kg)  08/13/23 232 lb 11.2 oz (105.6 kg)  06/18/23 236 lb 12.8 oz (107.4 kg)    GEN: Well nourished, well developed in no acute distress NECK: No JVD; No carotid bruits CARDIAC: RRR, no murmurs, rubs, gallops RESPIRATORY:  Clear to auscultation without rales, wheezing or rhonchi  ABDOMEN: Soft, non-tender, non-distended EXTREMITIES:  No edema; No deformity   ASSESSMENT AND PLAN  Shortness of breath and chest pain: Coronary CT obtained earlier this  year was abnormal with significant LAD disease and at least moderate left circumflex lesion.  He opted for medical therapy at that time which was reasonable given lack of symptom.  However in the past few weeks, he has been having worsening fatigue, shortness of breath with exertion and chest discomfort.  At this time, we recommend proceed with cardiac catheterization for definitive evaluation.  He will need 4 hours of IV hydration given poor renal function.  Obtain basic metabolic panel and a CBC today.  No LV gram.  CAD: See #1, significant mid LAD lesion and moderate disease in left circumflex artery  Hypertension: Blood pressure  stable  Hyperlipidemia: Continue fenofibrate  and rosuvastatin   DM2: Managed by primary care provider.  CKD stage III: Obtain basic metabolic panel.  No LV gram during cardiac authorization.  4 hours IV hydration prior to cardiac catheterization.  Risk of potential renal injury discussed with the patient.    Informed Consent   Shared Decision Making/Informed Consent The risks [stroke (1 in 1000), death (1 in 1000), kidney failure [usually temporary] (1 in 500), bleeding (1 in 200), allergic reaction [possibly serious] (1 in 200)], benefits (diagnostic support and management of coronary artery disease) and alternatives of a cardiac catheterization were discussed in detail with Mr. Mathey and he is willing to proceed.     Dispo: Follow-up in 4 weeks.  Signed, Scot Ford, PA

## 2023-10-18 NOTE — Telephone Encounter (Signed)
 Spoke with patient regarding shortness of breath.  Stated over the last couple of weeks increase shortness of breath, fluttering in chest, and overall not feeling well. Discussed conversation he and Dr Lonni had at April office visit, see below. Stated he didn't feel it was at the point needed to go to ED   CAD -Ca score 1308 (91st %ile), severe mLAD, moderate D1 and Lcx. See images above -denies any chest pain. Has nonlimiting dyspnea on exertion. Discussed cath, but given lack of major/high risk symptoms, decided together to focus on medical management first. -reviewed difference between stable and unstable angina -discussed importance of management of risk factors, especially lipids, tobacco use, type II diabetes, hypertension -reviewed red flag warning signs that need immediate medical attention   Scheduled visit with VEAR Ford PA at 2:00 pm, ok per scheduling team  Advised if worsening symptoms go to ED for evaluation

## 2023-10-18 NOTE — Patient Instructions (Addendum)
 Medication Instructions:  NO CHANGES *If you need a refill on your cardiac medications before your next appointment, please call your pharmacy*  Lab Work: BMP AND CBC TODAY on 1st floor at [facility]   If you have labs (blood work) drawn today and your tests are completely normal, you will receive your results only by: MyChart Message (if you have MyChart) OR A paper copy in the mail If you have any lab test that is abnormal or we need to change your treatment, we will call you to review the results.  Testing/Procedures:[address] Your physician has requested that you have an echocardiogram. Echocardiography is a painless test that uses sound waves to create images of your heart. It provides your doctor with information about the size and shape of your heart and how well your heart's chambers and valves are working. This procedure takes approximately one hour. There are no restrictions for this procedure. Please do NOT wear cologne, perfume, aftershave, or lotions (deodorant is allowed). Please arrive 15 minutes prior to your appointment time.  Please note: We ask at that you not bring children with you during ultrasound (echo/ vascular) testing. Due to room size and safety concerns, children are not allowed in the ultrasound rooms during exams. Our front office staff cannot provide observation of children in our lobby area while testing is being conducted. An adult accompanying a patient to their appointment will only be allowed in the ultrasound room at the discretion of the ultrasound technician under special circumstances. We apologize for any inconvenience.    Your physician has requested that you have a cardiac catheterization. Cardiac catheterization is used to diagnose and/or treat various heart conditions. Doctors may recommend this procedure for a number of different reasons. The most common reason is to evaluate chest pain. Chest pain can be a symptom of coronary artery disease  (CAD), and cardiac catheterization can show whether plaque is narrowing or blocking your heart's arteries. This procedure is also used to evaluate the valves, as well as measure the blood flow and oxygen levels in different parts of your heart. For further information please visit [email or web address] Please follow instruction sheet, as given.   Follow-Up: At [facility], you and your health needs are our priority.  As part of our continuing mission to provide you with exceptional heart care, our providers are all part of one team.  This team includes your primary Cardiologist (physician) and Advanced Practice Providers or APPs (Physician Assistants and Nurse Practitioners) who all work together to provide you with the care you need, when you need it.  Your next appointment:   4 week(s) after heart cath  Provider:   Hao Meng, PA-C  OTHER INSTRUCTIONS  Tim Decker  10/18/2023  You are scheduled for a Cardiac Catheterization on Wednesday, July 9 with Dr. Lonni Cash.  1. Please arrive at the [facility] (Main Entrance A) at [facility] [address]: [address] [place], [place] [address] at 6:30 AM (This time is 5 hour(s) before your procedure to ensure your preparation). - You will receive IV hydration prior to your Heart Cath.    Free valet parking service is available. You will check in at ADMITTING. The support person will be asked to wait in the waiting room.  It is OK to have someone drop you off and come back when you are ready to be discharged.    Special note: Every effort is made to have your procedure done on time. Please understand  that emergencies sometimes delay scheduled procedures.  2. Diet: Do not eat solid foods after midnight.  The patient may have clear liquids until 5am upon the day of the procedure.  3. Labs: You will need to have blood drawn TODAY at [facility] on 1st floor..  4. Medication instructions in preparation for your procedure:    Contrast Allergy: No  Do NOT take Spironolactone  the morning of Heart Cath.  DO NOT take Irbesartan  the morning of Heart Cath.    On the morning of your procedure, take your Aspirin 81 mg and any morning medicines NOT listed above.  You may use sips of water.  5. Plan to go home the same day, you will only stay overnight if medically necessary. 6. Bring a current list of your medications and current insurance cards. 7. You MUST have a responsible person to drive you home. 8. Someone MUST be with you the first 24 hours after you arrive home or your discharge will be delayed. 9. Please wear clothes that are easy to get on and off and wear slip-on shoes.  Thank you for allowing us  to care for you!   -- [name]

## 2023-10-20 ENCOUNTER — Ambulatory Visit: Payer: Self-pay | Admitting: Physician Assistant

## 2023-10-20 ENCOUNTER — Other Ambulatory Visit: Payer: Self-pay | Admitting: Physician Assistant

## 2023-10-20 DIAGNOSIS — N1832 Chronic kidney disease, stage 3b: Secondary | ICD-10-CM

## 2023-10-20 LAB — BASIC METABOLIC PANEL WITH GFR
BUN/Creatinine Ratio: 11 (ref 10–24)
BUN: 26 mg/dL (ref 8–27)
CO2: 16 mmol/L — ABNORMAL LOW (ref 20–29)
Calcium: 10.1 mg/dL (ref 8.6–10.2)
Chloride: 103 mmol/L (ref 96–106)
Creatinine, Ser: 2.47 mg/dL — ABNORMAL HIGH (ref 0.76–1.27)
Glucose: 78 mg/dL (ref 70–99)
Potassium: 5.2 mmol/L (ref 3.5–5.2)
Sodium: 141 mmol/L (ref 134–144)
eGFR: 28 mL/min/1.73 — ABNORMAL LOW (ref >59)

## 2023-10-20 LAB — CBC
Hematocrit: 48.7 % (ref 37.5–51.0)
Hemoglobin: 16 g/dL (ref 13.0–17.7)
MCH: 30.8 pg (ref 26.6–33.0)
MCHC: 32.9 g/dL (ref 31.5–35.7)
MCV: 94 fL (ref 79–97)
Platelets: 297 x10E3/uL (ref 150–450)
RBC: 5.19 x10E6/uL (ref 4.14–5.80)
RDW: 13.8 % (ref 11.6–15.4)
WBC: 8.2 x10E3/uL (ref 3.4–10.8)

## 2023-10-20 NOTE — Progress Notes (Signed)
 I have already called and spoke with Tim Decker, his renal function worsened and Cr is now 2.47, baseline Cr should be between 1.7-2.0. I instructed him to stop spironolactone  and irbesartan  now instead of the day prior to cath and aggressively hydrate himself. He will come by our office on Monday for repeat BMET. Order placed. Hopefully, his Cr would have improved by that point for cath. He will have 4 hours of precath hydration as well.

## 2023-10-22 DIAGNOSIS — N1832 Chronic kidney disease, stage 3b: Secondary | ICD-10-CM | POA: Diagnosis not present

## 2023-10-23 ENCOUNTER — Telehealth: Payer: Self-pay | Admitting: *Deleted

## 2023-10-23 ENCOUNTER — Ambulatory Visit: Payer: Self-pay | Admitting: Physician Assistant

## 2023-10-23 LAB — BASIC METABOLIC PANEL WITH GFR
BUN/Creatinine Ratio: 12 (ref 10–24)
BUN: 21 mg/dL (ref 8–27)
CO2: 22 mmol/L (ref 20–29)
Calcium: 9.6 mg/dL (ref 8.6–10.2)
Chloride: 100 mmol/L (ref 96–106)
Creatinine, Ser: 1.75 mg/dL — ABNORMAL HIGH (ref 0.76–1.27)
Glucose: 94 mg/dL (ref 70–99)
Potassium: 5.1 mmol/L (ref 3.5–5.2)
Sodium: 137 mmol/L (ref 134–144)
eGFR: 42 mL/min/1.73 — ABNORMAL LOW (ref >59)

## 2023-10-23 NOTE — Telephone Encounter (Signed)
 Cardiac Catheterization scheduled at [facility] for: Wednesday October 24, 2023 2:30 PM (time change per cath schedule) Arrival time [facility] Main Entrance A at: 9:30 AM-pre-procedure hydration  Nothing to eat after midnight prior to procedure, clear liquids until 5 AM day of procedure.  Medication instructions: -Hold:  Irbesartan /Spironolactone -on hold 10/20/23 -will continue to hold until post procedure eGFR <60 (42) -Other usual morning medications can be taken with sips of water including aspirin  81 mg.  Plan to go home the same day, you will only stay overnight if medically necessary.  You must have responsible adult to drive you home.  Someone must be with you the first 24 hours after you arrive home.  Reviewed procedure instructions /pre-procedure hydration/arrival/procedure time change with patient.

## 2023-10-24 ENCOUNTER — Ambulatory Visit (HOSPITAL_COMMUNITY)
Admission: RE | Admit: 2023-10-24 | Discharge: 2023-10-24 | Disposition: A | Discharge status: Home / Self Care | Attending: Cardiology | Admitting: Cardiology

## 2023-10-24 ENCOUNTER — Other Ambulatory Visit: Payer: Self-pay

## 2023-10-24 ENCOUNTER — Encounter (HOSPITAL_COMMUNITY)
Admission: RE | Disposition: A | Discharge status: Home / Self Care | Payer: Self-pay | Source: Home / Self Care | Attending: Cardiology

## 2023-10-24 ENCOUNTER — Other Ambulatory Visit: Payer: Self-pay | Admitting: Cardiology

## 2023-10-24 DIAGNOSIS — I429 Cardiomyopathy, unspecified: Secondary | ICD-10-CM | POA: Diagnosis not present

## 2023-10-24 DIAGNOSIS — Z79899 Other long term (current) drug therapy: Secondary | ICD-10-CM | POA: Diagnosis not present

## 2023-10-24 DIAGNOSIS — E785 Hyperlipidemia, unspecified: Secondary | ICD-10-CM | POA: Diagnosis not present

## 2023-10-24 DIAGNOSIS — N183 Chronic kidney disease, stage 3 unspecified: Secondary | ICD-10-CM | POA: Insufficient documentation

## 2023-10-24 DIAGNOSIS — E1122 Type 2 diabetes mellitus with diabetic chronic kidney disease: Secondary | ICD-10-CM | POA: Diagnosis not present

## 2023-10-24 DIAGNOSIS — I25118 Atherosclerotic heart disease of native coronary artery with other forms of angina pectoris: Secondary | ICD-10-CM | POA: Diagnosis not present

## 2023-10-24 DIAGNOSIS — I129 Hypertensive chronic kidney disease with stage 1 through stage 4 chronic kidney disease, or unspecified chronic kidney disease: Secondary | ICD-10-CM | POA: Diagnosis not present

## 2023-10-24 HISTORY — PX: LEFT HEART CATH AND CORONARY ANGIOGRAPHY: CATH118249

## 2023-10-24 LAB — GLUCOSE, CAPILLARY: Glucose-Capillary: 108 mg/dL — ABNORMAL HIGH (ref 70–99)

## 2023-10-24 SURGERY — LEFT HEART CATH AND CORONARY ANGIOGRAPHY
Anesthesia: LOCAL

## 2023-10-24 MED ORDER — VERAPAMIL HCL 2.5 MG/ML IV SOLN
INTRAVENOUS | Status: DC | PRN
  Administered 2023-10-24: 10 mL via INTRA_ARTERIAL

## 2023-10-24 MED ORDER — HEPARIN SODIUM (PORCINE) 1000 UNIT/ML IJ SOLN
INTRAMUSCULAR | Status: AC
  Filled 2023-10-24: qty 10

## 2023-10-24 MED ORDER — ACETAMINOPHEN 325 MG PO TABS: 650.0000 mg | ORAL_TABLET | ORAL | Status: DC | PRN

## 2023-10-24 MED ORDER — IOHEXOL 350 MG/ML SOLN
INTRAVENOUS | Status: DC | PRN
Start: 2023-10-24 — End: 2023-10-24
  Administered 2023-10-24: 35 mL

## 2023-10-24 MED ORDER — MIDAZOLAM HCL 2 MG/2ML IJ SOLN
INTRAMUSCULAR | Status: DC | PRN
  Administered 2023-10-24: 1 mg via INTRAVENOUS

## 2023-10-24 MED ORDER — SODIUM CHLORIDE 0.9% FLUSH: 3.0000 mL | INTRAVENOUS | Status: DC | PRN

## 2023-10-24 MED ORDER — SODIUM CHLORIDE 0.9% FLUSH: 3.0000 mL | Freq: Two times a day (BID) | INTRAVENOUS | Status: DC

## 2023-10-24 MED ORDER — SODIUM CHLORIDE 0.9 % IV SOLN: INTRAVENOUS | Status: DC

## 2023-10-24 MED ORDER — SODIUM CHLORIDE 0.9 % IV SOLN: 250.0000 mL | INTRAVENOUS | Status: DC | PRN

## 2023-10-24 MED ORDER — LIDOCAINE HCL (PF) 1 % IJ SOLN
INTRAMUSCULAR | Status: DC | PRN
Start: 2023-10-24 — End: 2023-10-24
  Administered 2023-10-24: 2 mL

## 2023-10-24 MED ORDER — ONDANSETRON HCL 4 MG/2ML IJ SOLN: 4.0000 mg | Freq: Four times a day (QID) | INTRAMUSCULAR | Status: DC | PRN

## 2023-10-24 MED ORDER — HEPARIN SODIUM (PORCINE) 1000 UNIT/ML IJ SOLN
INTRAMUSCULAR | Status: DC | PRN
  Administered 2023-10-24: 5000 [IU] via INTRAVENOUS

## 2023-10-24 MED ORDER — FENTANYL CITRATE (PF) 100 MCG/2ML IJ SOLN
INTRAMUSCULAR | Status: DC | PRN
  Administered 2023-10-24: 25 ug via INTRAVENOUS

## 2023-10-24 MED ORDER — LIDOCAINE HCL (PF) 1 % IJ SOLN
INTRAMUSCULAR | Status: AC
  Filled 2023-10-24: qty 30

## 2023-10-24 MED ORDER — SODIUM CHLORIDE 0.9 % WEIGHT BASED INFUSION: 1.0000 mL/kg/h | INTRAVENOUS | Status: DC

## 2023-10-24 MED ORDER — SODIUM CHLORIDE 0.9 % WEIGHT BASED INFUSION
3.0000 mL/kg/h | INTRAVENOUS | Status: AC
  Administered 2023-10-24: 3 mL/kg/h via INTRAVENOUS

## 2023-10-24 MED ORDER — FENTANYL CITRATE (PF) 100 MCG/2ML IJ SOLN
INTRAMUSCULAR | Status: AC
  Filled 2023-10-24: qty 2

## 2023-10-24 MED ORDER — LABETALOL HCL 5 MG/ML IV SOLN: 10.0000 mg | INTRAVENOUS | Status: DC | PRN

## 2023-10-24 MED ORDER — HYDRALAZINE HCL 20 MG/ML IJ SOLN: 10.0000 mg | INTRAMUSCULAR | Status: DC | PRN

## 2023-10-24 MED ORDER — ASPIRIN 81 MG PO CHEW: 81.0000 mg | CHEWABLE_TABLET | ORAL | Status: DC

## 2023-10-24 MED ORDER — HEPARIN (PORCINE) IN NACL 1000-0.9 UT/500ML-% IV SOLN
INTRAVENOUS | Status: DC | PRN
  Administered 2023-10-24 (×2): 500 mL

## 2023-10-24 MED ORDER — CLOPIDOGREL BISULFATE 75 MG PO TABS: 75.0000 mg | ORAL_TABLET | Freq: Every day | ORAL | 11 refills | Status: AC

## 2023-10-24 MED ORDER — VERAPAMIL HCL 2.5 MG/ML IV SOLN
INTRAVENOUS | Status: AC
  Filled 2023-10-24: qty 2

## 2023-10-24 MED ORDER — MIDAZOLAM HCL 2 MG/2ML IJ SOLN
INTRAMUSCULAR | Status: AC
  Filled 2023-10-24: qty 2

## 2023-10-24 SURGICAL SUPPLY — 7 items
CATH 5FR JL3.5 JR4 ANG PIG MP (CATHETERS) IMPLANT
DEVICE RAD COMP TR BAND LRG (VASCULAR PRODUCTS) IMPLANT
GLIDESHEATH SLEND SS 6F .021 (SHEATH) IMPLANT
GUIDEWIRE INQWIRE 1.5J.035X260 (WIRE) IMPLANT
PACK CARDIAC CATHETERIZATION (CUSTOM PROCEDURE TRAY) ×2 IMPLANT
SET ATX-X65L (MISCELLANEOUS) IMPLANT
SHEATH PROBE COVER 6X72 (BAG) IMPLANT

## 2023-10-24 NOTE — Progress Notes (Signed)
 BMET ordered per Dr. Swaziland

## 2023-10-24 NOTE — Interval H&P Note (Signed)
 History and Physical Interval Note:  10/24/2023 2:18 PM  Tim Decker  has presented today for surgery, with the diagnosis of chest pain - abnormal ct.  The various methods of treatment have been discussed with the patient and family. After consideration of risks, benefits and other options for treatment, the patient has consented to  Procedure(s): LEFT HEART CATH AND CORONARY ANGIOGRAPHY (N/A) as a surgical intervention.  The patient's history has been reviewed, patient examined, no change in status, stable for surgery.  I have reviewed the patient's chart and labs.  Questions were answered to the patient's satisfaction.   Cath Lab Visit (complete for each Cath Lab visit)  Clinical Evaluation Leading to the Procedure:   ACS: No.  Non-ACS:    Anginal Classification: CCS II  Anti-ischemic medical therapy: Maximal Therapy (2 or more classes of medications)  Non-Invasive Test Results: Intermediate-risk stress test findings: cardiac mortality 1-3%/year  Prior CABG: No previous CABG        Maude [facility] 10/24/2023 2:18 PM

## 2023-10-25 ENCOUNTER — Encounter (HOSPITAL_COMMUNITY): Payer: Self-pay | Admitting: Cardiology

## 2023-10-29 ENCOUNTER — Other Ambulatory Visit: Payer: Self-pay | Admitting: *Deleted

## 2023-10-29 ENCOUNTER — Telehealth: Payer: Self-pay | Admitting: *Deleted

## 2023-10-29 DIAGNOSIS — I25111 Atherosclerotic heart disease of native coronary artery with angina pectoris with documented spasm: Secondary | ICD-10-CM

## 2023-10-29 DIAGNOSIS — Z01812 Encounter for preprocedural laboratory examination: Secondary | ICD-10-CM

## 2023-10-29 NOTE — Telephone Encounter (Addendum)
 Coronary Stent  scheduled at [facility] for: Wednesday November 07, 2023 1 PM Arrival time [facility] Main Entrance A at: 8 AM-pre-procedure hydration  Nothing to eat after midnight prior to procedure, clear liquids until 5 AM day of procedure.  Medication instructions: -Hold:  Irbesartan -day before and day of procedure-per protocol GFR <60   Patient tells me he is not currently taking spironolactone . -Other usual morning medications can be taken with sips of water including aspirin  81 mg and Plavix  75 mg.   Plan to go home the same day, you will only stay overnight if medically necessary.  You must have responsible adult to drive you home.  Someone must be with you the first 24 hours after you arrive home.  Patient will plan to get pre-procedure BMP/CBC 10/31/23 at [facility].

## 2023-10-31 ENCOUNTER — Ambulatory Visit (HOSPITAL_COMMUNITY)
Admission: RE | Admit: 2023-10-31 | Discharge: 2023-10-31 | Disposition: A | Discharge status: Home / Self Care | Source: Ambulatory Visit | Attending: Cardiovascular Disease | Admitting: Cardiovascular Disease

## 2023-10-31 DIAGNOSIS — Z01812 Encounter for preprocedural laboratory examination: Secondary | ICD-10-CM | POA: Diagnosis not present

## 2023-10-31 DIAGNOSIS — R0602 Shortness of breath: Secondary | ICD-10-CM | POA: Insufficient documentation

## 2023-10-31 DIAGNOSIS — I25111 Atherosclerotic heart disease of native coronary artery with angina pectoris with documented spasm: Secondary | ICD-10-CM | POA: Diagnosis not present

## 2023-10-31 LAB — ECHOCARDIOGRAM COMPLETE
AR max vel: 3.61 cm2
AV Peak grad: 5.2 mmHg
Ao pk vel: 1.14 m/s
Area-P 1/2: 3.48 cm2
MV M vel: 4.89 m/s
MV Peak grad: 95.6 mmHg
S' Lateral: 3.7 cm

## 2023-10-31 LAB — CBC
Hematocrit: 49 % (ref 37.5–51.0)
Hemoglobin: 16 g/dL (ref 13.0–17.7)
MCH: 30.7 pg (ref 26.6–33.0)
MCHC: 32.7 g/dL (ref 31.5–35.7)
MCV: 94 fL (ref 79–97)
Platelets: 275 x10E3/uL (ref 150–450)
RBC: 5.21 x10E6/uL (ref 4.14–5.80)
RDW: 14.1 % (ref 11.6–15.4)
WBC: 6.9 x10E3/uL (ref 3.4–10.8)

## 2023-11-01 ENCOUNTER — Ambulatory Visit: Payer: Self-pay | Admitting: Cardiology

## 2023-11-01 ENCOUNTER — Other Ambulatory Visit: Payer: Self-pay | Admitting: Cardiology

## 2023-11-01 DIAGNOSIS — I209 Angina pectoris, unspecified: Secondary | ICD-10-CM

## 2023-11-01 LAB — BASIC METABOLIC PANEL WITH GFR
BUN/Creatinine Ratio: 10 (ref 10–24)
BUN: 19 mg/dL (ref 8–27)
CO2: 23 mmol/L (ref 20–29)
Calcium: 10.1 mg/dL (ref 8.6–10.2)
Chloride: 102 mmol/L (ref 96–106)
Creatinine, Ser: 1.82 mg/dL — ABNORMAL HIGH (ref 0.76–1.27)
Glucose: 109 mg/dL — ABNORMAL HIGH (ref 70–99)
Potassium: 5.6 mmol/L — ABNORMAL HIGH (ref 3.5–5.2)
Sodium: 142 mmol/L (ref 134–144)
eGFR: 40 mL/min/1.73 — ABNORMAL LOW (ref >59)

## 2023-11-06 ENCOUNTER — Telehealth: Payer: Self-pay | Admitting: Cardiology

## 2023-11-06 NOTE — Telephone Encounter (Signed)
 Left message to call office

## 2023-11-06 NOTE — Telephone Encounter (Signed)
 Pt returning call to a nurse

## 2023-11-07 ENCOUNTER — Ambulatory Visit (HOSPITAL_COMMUNITY)
Admission: RE | Admit: 2023-11-07 | Discharge: 2023-11-07 | Disposition: A | Discharge status: Home / Self Care | Attending: Cardiology | Admitting: Cardiology

## 2023-11-07 ENCOUNTER — Ambulatory Visit (HOSPITAL_COMMUNITY)
Admission: RE | Disposition: A | Discharge status: Home / Self Care | Payer: Self-pay | Source: Home / Self Care | Attending: Cardiology

## 2023-11-07 ENCOUNTER — Other Ambulatory Visit: Payer: Self-pay

## 2023-11-07 DIAGNOSIS — N1832 Chronic kidney disease, stage 3b: Secondary | ICD-10-CM | POA: Diagnosis not present

## 2023-11-07 DIAGNOSIS — F17200 Nicotine dependence, unspecified, uncomplicated: Secondary | ICD-10-CM | POA: Diagnosis not present

## 2023-11-07 DIAGNOSIS — Z79899 Other long term (current) drug therapy: Secondary | ICD-10-CM | POA: Insufficient documentation

## 2023-11-07 DIAGNOSIS — Z7902 Long term (current) use of antithrombotics/antiplatelets: Secondary | ICD-10-CM | POA: Diagnosis not present

## 2023-11-07 DIAGNOSIS — Z955 Presence of coronary angioplasty implant and graft: Secondary | ICD-10-CM

## 2023-11-07 DIAGNOSIS — Z7982 Long term (current) use of aspirin: Secondary | ICD-10-CM | POA: Insufficient documentation

## 2023-11-07 DIAGNOSIS — Z6834 Body mass index (BMI) 34.0-34.9, adult: Secondary | ICD-10-CM | POA: Insufficient documentation

## 2023-11-07 DIAGNOSIS — I429 Cardiomyopathy, unspecified: Secondary | ICD-10-CM | POA: Insufficient documentation

## 2023-11-07 DIAGNOSIS — I25118 Atherosclerotic heart disease of native coronary artery with other forms of angina pectoris: Secondary | ICD-10-CM

## 2023-11-07 DIAGNOSIS — E1122 Type 2 diabetes mellitus with diabetic chronic kidney disease: Secondary | ICD-10-CM | POA: Diagnosis not present

## 2023-11-07 DIAGNOSIS — E785 Hyperlipidemia, unspecified: Secondary | ICD-10-CM | POA: Insufficient documentation

## 2023-11-07 DIAGNOSIS — I129 Hypertensive chronic kidney disease with stage 1 through stage 4 chronic kidney disease, or unspecified chronic kidney disease: Secondary | ICD-10-CM | POA: Insufficient documentation

## 2023-11-07 DIAGNOSIS — I209 Angina pectoris, unspecified: Secondary | ICD-10-CM

## 2023-11-07 DIAGNOSIS — I2510 Atherosclerotic heart disease of native coronary artery without angina pectoris: Secondary | ICD-10-CM | POA: Insufficient documentation

## 2023-11-07 HISTORY — PX: CORONARY STENT INTERVENTION: CATH118234

## 2023-11-07 LAB — GLUCOSE, CAPILLARY
Glucose-Capillary: 160 mg/dL — ABNORMAL HIGH (ref 70–99)
Glucose-Capillary: 124 mg/dL — ABNORMAL HIGH (ref 70–99)
Glucose-Capillary: 111 mg/dL — ABNORMAL HIGH (ref 70–99)

## 2023-11-07 LAB — BASIC METABOLIC PANEL WITH GFR
Anion gap: 10 (ref 5–15)
BUN: 15 mg/dL (ref 8–23)
CO2: 24 mmol/L (ref 22–32)
Calcium: 9.3 mg/dL (ref 8.9–10.3)
Chloride: 102 mmol/L (ref 98–111)
Creatinine, Ser: 1.6 mg/dL — ABNORMAL HIGH (ref 0.61–1.24)
GFR, Estimated: 47 mL/min — ABNORMAL LOW (ref >60)
Glucose, Bld: 145 mg/dL — ABNORMAL HIGH (ref 70–99)
Potassium: 4.4 mmol/L (ref 3.5–5.1)
Sodium: 136 mmol/L (ref 135–145)

## 2023-11-07 LAB — POCT ACTIVATED CLOTTING TIME: Activated Clotting Time: 412 s

## 2023-11-07 SURGERY — CORONARY STENT INTERVENTION
Anesthesia: LOCAL

## 2023-11-07 MED ORDER — VERAPAMIL HCL 2.5 MG/ML IV SOLN: INTRAVENOUS | Status: DC | PRN

## 2023-11-07 MED ORDER — HYDRALAZINE HCL 20 MG/ML IJ SOLN: 10.0000 mg | INTRAMUSCULAR | Status: DC | PRN

## 2023-11-07 MED ORDER — SODIUM CHLORIDE 0.9% FLUSH: 3.0000 mL | INTRAVENOUS | Status: DC | PRN

## 2023-11-07 MED ORDER — VERAPAMIL HCL 2.5 MG/ML IV SOLN
INTRAVENOUS | Status: AC
  Filled 2023-11-07: qty 2

## 2023-11-07 MED ORDER — PANTOPRAZOLE SODIUM 40 MG PO TBEC: 40.0000 mg | DELAYED_RELEASE_TABLET | Freq: Every day | ORAL | 2 refills | Status: DC

## 2023-11-07 MED ORDER — ACETAMINOPHEN 325 MG PO TABS: 650.0000 mg | ORAL_TABLET | ORAL | Status: DC | PRN

## 2023-11-07 MED ORDER — ONDANSETRON HCL 4 MG/2ML IJ SOLN: 4.0000 mg | Freq: Four times a day (QID) | INTRAMUSCULAR | Status: DC | PRN

## 2023-11-07 MED ORDER — SODIUM CHLORIDE 0.9 % IV SOLN: 250.0000 mL | INTRAVENOUS | Status: DC | PRN

## 2023-11-07 MED ORDER — SODIUM CHLORIDE 0.9% FLUSH: 3.0000 mL | Freq: Two times a day (BID) | INTRAVENOUS | Status: DC

## 2023-11-07 MED ORDER — HEPARIN SODIUM (PORCINE) 1000 UNIT/ML IJ SOLN
INTRAMUSCULAR | Status: DC | PRN
  Administered 2023-11-07: 10000 [IU] via INTRAVENOUS

## 2023-11-07 MED ORDER — LABETALOL HCL 5 MG/ML IV SOLN: 10.0000 mg | INTRAVENOUS | Status: DC | PRN

## 2023-11-07 MED ORDER — LIDOCAINE HCL (PF) 1 % IJ SOLN
INTRAMUSCULAR | Status: AC
  Filled 2023-11-07: qty 30

## 2023-11-07 MED ORDER — IOHEXOL 350 MG/ML SOLN
INTRAVENOUS | Status: DC | PRN
  Administered 2023-11-07: 90 mL

## 2023-11-07 MED ORDER — ASPIRIN 81 MG PO CHEW: 81.0000 mg | CHEWABLE_TABLET | ORAL | Status: DC

## 2023-11-07 MED ORDER — LIDOCAINE HCL (PF) 1 % IJ SOLN
INTRAMUSCULAR | Status: DC | PRN
  Administered 2023-11-07: 2 mL

## 2023-11-07 MED ORDER — SODIUM CHLORIDE 0.9 % WEIGHT BASED INFUSION: 1.0000 mL/kg/h | INTRAVENOUS | Status: DC

## 2023-11-07 MED ORDER — HEPARIN SODIUM (PORCINE) 1000 UNIT/ML IJ SOLN
INTRAMUSCULAR | Status: AC
  Filled 2023-11-07: qty 10

## 2023-11-07 MED ORDER — FENTANYL CITRATE (PF) 100 MCG/2ML IJ SOLN
INTRAMUSCULAR | Status: DC | PRN
  Administered 2023-11-07: 25 ug via INTRAVENOUS

## 2023-11-07 MED ORDER — SODIUM CHLORIDE 0.9 % WEIGHT BASED INFUSION
3.0000 mL/kg/h | INTRAVENOUS | Status: AC
  Administered 2023-11-07: 3 mL/kg/h via INTRAVENOUS

## 2023-11-07 MED ORDER — SODIUM CHLORIDE 0.9 % IV SOLN: INTRAVENOUS | Status: DC

## 2023-11-07 MED ORDER — HEPARIN (PORCINE) IN NACL 2000-0.9 UNIT/L-% IV SOLN
INTRAVENOUS | Status: DC | PRN
  Administered 2023-11-07: 1000 mL

## 2023-11-07 MED ORDER — MIDAZOLAM HCL 2 MG/2ML IJ SOLN
INTRAMUSCULAR | Status: DC | PRN
  Administered 2023-11-07: 1 mg via INTRAVENOUS

## 2023-11-07 MED ORDER — FENTANYL CITRATE (PF) 100 MCG/2ML IJ SOLN
INTRAMUSCULAR | Status: AC
  Filled 2023-11-07: qty 2

## 2023-11-07 MED ORDER — MIDAZOLAM HCL 2 MG/2ML IJ SOLN
INTRAMUSCULAR | Status: AC
  Filled 2023-11-07: qty 2

## 2023-11-07 SURGICAL SUPPLY — 17 items
BALLOON EMERGE MR 2.0X12 (BALLOONS) IMPLANT
BALLOON EMERGE MR 2.5X12 (BALLOONS) IMPLANT
BALLOON ~~LOC~~ EMERGE MR 3.75X15 (BALLOONS) IMPLANT
CATH LAUNCHER 6FR EBU3.5 (CATHETERS) IMPLANT
DEVICE RAD COMP TR BAND LRG (VASCULAR PRODUCTS) IMPLANT
GLIDESHEATH SLEND SS 6F .021 (SHEATH) IMPLANT
GUIDEWIRE INQWIRE 1.5J.035X260 (WIRE) IMPLANT
KIT ESSENTIALS PG (KITS) IMPLANT
KIT SINGLE USE MANIFOLD (KITS) IMPLANT
KIT SYRINGE INJ CVI SPIKEX1 (MISCELLANEOUS) IMPLANT
PACK CARDIAC CATHETERIZATION (CUSTOM PROCEDURE TRAY) ×2 IMPLANT
SET ATX-X65L (MISCELLANEOUS) IMPLANT
SHEATH PROBE COVER 6X72 (BAG) IMPLANT
STENT SYNERGY XD 2.50X16 (Permanent Stent) IMPLANT
STENT SYNERGY XD 3.50X20 (Permanent Stent) IMPLANT
TUBING CIL FLEX 10 FLL-RA (TUBING) IMPLANT
WIRE ASAHI PROWATER 180CM (WIRE) IMPLANT

## 2023-11-07 NOTE — Interval H&P Note (Signed)
 History and Physical Interval Note:  11/07/2023 9:48 AM  Tim Decker  has presented today for surgery, with the diagnosis of cad.  The various methods of treatment have been discussed with the patient and family. After consideration of risks, benefits and other options for treatment, the patient has consented to  Procedure(s): CORONARY STENT INTERVENTION (N/A) as a surgical intervention.  The patient's history has been reviewed, patient examined, no change in status, stable for surgery.  I have reviewed the patient's chart and labs.  Questions were answered to the patient's satisfaction.   Cath Lab Visit (complete for each Cath Lab visit)  Clinical Evaluation Leading to the Procedure:   ACS: No.  Non-ACS:    Anginal Classification: CCS III  Anti-ischemic medical therapy: Minimal Therapy (1 class of medications)  Non-Invasive Test Results: Intermediate-risk stress test findings: cardiac mortality 1-3%/year  Prior CABG: No previous CABG        Maude [facility] 11/07/2023 9:48 AM

## 2023-11-07 NOTE — Telephone Encounter (Signed)
 Patient has been called by dr gib nurse.

## 2023-11-07 NOTE — Progress Notes (Signed)
 Patient and wife was given discharge instructions. Both verbalized understanding.

## 2023-11-07 NOTE — Discharge Instructions (Signed)

## 2023-11-07 NOTE — Discharge Summary (Signed)
 Discharge Summary for Same Day PCI   Patient ID: Tim Decker MRN: [id number]; DOB: [birth year]  Admit date: 11/07/2023 Discharge date: 11/07/2023  Primary Care Provider: [name], MD  Primary Cardiologist: Shelda Bruckner, MD  Primary Electrophysiologist:  None   Discharge Diagnoses    Active Problems:   CAD (coronary artery disease)   Diagnostic Studies/Procedures    Cardiac Catheterization 11/07/2023:    Prox LAD to Mid LAD lesion is 45% stenosed.   Prox RCA lesion is 45% stenosed.   1st Diag lesion is 90% stenosed.   2nd Mrg lesion is 90% stenosed.   Lat 2nd Mrg lesion is 90% stenosed.   RPDA lesion is 90% stenosed.   A drug-eluting stent was successfully placed using a STENT SYNERGY XD 2.50X16.   A drug-eluting stent was successfully placed using a STENT SYNERGY XD 3.50X20.   Post intervention, there is a 0% residual stenosis.   Post intervention, there is a 0% residual stenosis.   Recommend uninterrupted dual antiplatelet therapy with Aspirin  81mg  daily and Clopidogrel  75mg  daily for a minimum of 6 months (stable ischemic heart disease-Class I recommendation).   Successful PCI of the second OM proximally and distally with DES x 2   Plan: same day DC. DAPT for at least 6 months   Diagnostic Dominance: Right  Intervention   _____________   History of Present Illness     Tim Decker is a 68 y.o. male with past medical history of CAD, cardiomyopathy, hypertension, hyperlipidemia and DM II.  Echocardiogram in 2020 showed EF 35 to 40%, severe.  Myoview  in 2020 showed EF 44%, mild diffuse hypokinesis, small fixed defect in the apical area, no ischemia.  He declined Entresto due to cost.  EF normalized to 55 to 60% by February 2023.  Coronary CT obtained in 2025 showed calcium  score 1308 which placed the patient 91st percentile for age and sex matched control, severe mid LAD lesion, moderate D1 and left circumflex stenosis.  Patient was seen by  Dr. Bruckner request for on 08/13/2023 at which time he denied any chest pain and has nonlimiting dyspnea on exertion.  Cardiac catheterization was discussed with the patient however given all major and high risk symptom, it was decided to focus on medical management first.   Patient presented back to the [facility] clinic as an add-on for follow-up and evaluation of shortness of breath with exertion and see by Scot Ford, PA.  He had occasional chest tightness however main issue was he was getting more more short of breath with less activity for the past several weeks.  He was also more fatigued than usual.  EKG showed no acute changes.  It was recommended outpatient echocardiogram and proceed with cardiac catheterization.   Hospital Course     The patient underwent cardiac cath as noted above with successful PCI/DES x2 of 2nd OM. Plan for DAPT with ASA/plavix  for at least 6 months. The patient was seen by cardiac rehab while in short stay. There were no observed complications post cath. Radial cath site was re-evaluated prior to discharge and found to be stable without any complications. Instructions/precautions regarding cath site care were given prior to discharge.  Olsen Kyzer Blowe was seen by Dr. [place] and determined stable for discharge home. Follow up with our office has been arranged. Medications are listed below. Pertinent changes include switched to protonix .  _____________  Cath/PCI Registry Performance & Quality Measures: Aspirin  prescribed? - Yes ADP Receptor Inhibitor (Plavix /Clopidogrel , Brilinta/Ticagrelor  or Effient/Prasugrel) prescribed (includes medically managed patients)? - Yes High Intensity Statin (Lipitor 40-80mg  or Crestor  20-40mg ) prescribed? - Yes For EF <40%, was ACEI/ARB prescribed? - Yes For EF <40%, Aldosterone Antagonist (Spironolactone  or Eplerenone) prescribed? - No, reports this was stopped previously. Suspect 2/2 renal function Cardiac Rehab Phase II ordered  (Included Medically managed Patients)? - Yes  _____________   Discharge Vitals Blood pressure (!) 157/99, pulse 75, temperature 97.7 F (36.5 C), temperature source Oral, resp. rate 17, height 5' 8 (1.727 m), weight 104.3 kg, SpO2 94%.  Filed Weights   11/07/23 0757  Weight: 104.3 kg    Last Labs & Radiologic Studies    CBC No results for input(s): WBC, NEUTROABS, HGB, HCT, MCV, PLT in the last 72 hours. Basic Metabolic Panel Recent Labs    92/76/74 1000  NA 136  K 4.4  CL 102  CO2 24  GLUCOSE 145*  BUN 15  CREATININE 1.60*  CALCIUM  9.3   Liver Function Tests No results for input(s): AST, ALT, ALKPHOS, BILITOT, PROT, ALBUMIN in the last 72 hours. No results for input(s): LIPASE, AMYLASE in the last 72 hours. High Sensitivity Troponin:   No results for input(s): TROPONINIHS in the last 720 hours.  BNP Invalid input(s): POCBNP D-Dimer No results for input(s): DDIMER in the last 72 hours. Hemoglobin A1C No results for input(s): HGBA1C in the last 72 hours. Fasting Lipid Panel No results for input(s): CHOL, HDL, LDLCALC, TRIG, CHOLHDL, LDLDIRECT in the last 72 hours. Thyroid  Function Tests No results for input(s): TSH, T4TOTAL, T3FREE, THYROIDAB in the last 72 hours.  Invalid input(s): FREET3 _____________  CARDIAC CATHETERIZATION Result Date: 11/07/2023   Prox LAD to Mid LAD lesion is 45% stenosed.   Prox RCA lesion is 45% stenosed.   1st Diag lesion is 90% stenosed.   2nd Mrg lesion is 90% stenosed.   Lat 2nd Mrg lesion is 90% stenosed.   RPDA lesion is 90% stenosed.   A drug-eluting stent was successfully placed using a STENT SYNERGY XD 2.50X16.   A drug-eluting stent was successfully placed using a STENT SYNERGY XD 3.50X20.   Post intervention, there is a 0% residual stenosis.   Post intervention, there is a 0% residual stenosis.   Recommend uninterrupted dual antiplatelet therapy with Aspirin  81mg  daily and  Clopidogrel  75mg  daily for a minimum of 6 months (stable ischemic heart disease-Class I recommendation). Successful PCI of the second OM proximally and distally with DES x 2 Plan: same day DC. DAPT for at least 6 months  ECHOCARDIOGRAM COMPLETE Result Date: 10/31/2023    ECHOCARDIOGRAM REPORT   Patient Name:   Tim Decker [facility] Date of Exam: 10/31/2023 Medical Rec #:  [id number]          Height:       69.0 in Accession #:    [id number]         Weight:       228.0 lb Date of Birth:  05-Jul-1955          BSA:          2.184 m Patient Age:    67 years           BP:           122/82 mmHg Patient Gender: M                  HR:           63 bpm. Exam Location:  [address] Procedure: 2D  Echo, Cardiac Doppler, Color Doppler and Strain Analysis (Both            Spectral and Color Flow Doppler were utilized during procedure). Indications:    SOB; R06.02 SOB  History:        Patient has prior history of Echocardiogram examinations.                 Signs/Symptoms:Chest Pain and Shortness of Breath.  Sonographer:    Cherene Ravens RDCS Referring Phys: [id number] HAO MENG IMPRESSIONS  1. Left ventricular ejection fraction, by estimation, is 55 to 60%. The left ventricle has normal function. The left ventricle has no regional wall motion abnormalities. There is mild concentric left ventricular hypertrophy. Left ventricular diastolic parameters are consistent with Grade I diastolic dysfunction (impaired relaxation). The average left ventricular global longitudinal strain is -13.5 %. The global longitudinal strain is abnormal.  2. Right ventricular systolic function is normal. The right ventricular size is normal.  3. The mitral valve is normal in structure. Mild mitral valve regurgitation. No evidence of mitral stenosis.  4. The aortic valve is normal in structure. There is mild calcification of the aortic valve. Aortic valve regurgitation is not visualized. No aortic stenosis is present.  5. The inferior vena cava is normal in size  with greater than 50% respiratory variability, suggesting right atrial pressure of 3 mmHg. FINDINGS  Left Ventricle: Left ventricular ejection fraction, by estimation, is 55 to 60%. The left ventricle has normal function. The left ventricle has no regional wall motion abnormalities. The average left ventricular global longitudinal strain is -13.5 %. Strain was performed and the global longitudinal strain is abnormal. The left ventricular internal cavity size was normal in size. There is mild concentric left ventricular hypertrophy. Left ventricular diastolic parameters are consistent with Grade I diastolic dysfunction (impaired relaxation). Right Ventricle: The right ventricular size is normal. No increase in right ventricular wall thickness. Right ventricular systolic function is normal. Left Atrium: Left atrial size was normal in size. Right Atrium: Right atrial size was normal in size. Pericardium: There is no evidence of pericardial effusion. Mitral Valve: The mitral valve is normal in structure. Mild mitral valve regurgitation. No evidence of mitral valve stenosis. Tricuspid Valve: The tricuspid valve is normal in structure. Tricuspid valve regurgitation is not demonstrated. No evidence of tricuspid stenosis. Aortic Valve: The aortic valve is normal in structure. There is mild calcification of the aortic valve. Aortic valve regurgitation is not visualized. No aortic stenosis is present. Aortic valve peak gradient measures 5.2 mmHg. Pulmonic Valve: The pulmonic valve was normal in structure. Pulmonic valve regurgitation is not visualized. No evidence of pulmonic stenosis. Aorta: The aortic root is normal in size and structure. Venous: The inferior vena cava is normal in size with greater than 50% respiratory variability, suggesting right atrial pressure of 3 mmHg. IAS/Shunts: No atrial level shunt detected by color flow Doppler.  LEFT VENTRICLE PLAX 2D LVIDd:         5.40 cm   Diastology LVIDs:         3.70 cm    LV e' medial:    6.85 cm/s LV PW:         1.30 cm   LV E/e' medial:  11.0 LV IVS:        1.20 cm   LV e' lateral:   6.20 cm/s LVOT diam:     2.30 cm   LV E/e' lateral: 12.2 LV SV:  85 LV SV Index:   39        2D Longitudinal Strain LVOT Area:     4.15 cm  2D Strain GLS (A4C):   -13.2 %                          2D Strain GLS (A3C):   -12.4 %                          2D Strain GLS (A2C):   -15.0 %                          2D Strain GLS Avg:     -13.5 % RIGHT VENTRICLE             IVC RV Basal diam:  3.00 cm     IVC diam: 1.80 cm RV Mid diam:    2.50 cm RV S prime:     13.90 cm/s TAPSE (M-mode): 2.1 cm LEFT ATRIUM             Index        RIGHT ATRIUM           Index LA diam:        4.10 cm 1.88 cm/m   RA Pressure: 3.00 mmHg LA Vol (A2C):   35.7 ml 16.34 ml/m  RA Area:     12.30 cm LA Vol (A4C):   35.6 ml 16.30 ml/m  RA Volume:   24.90 ml  11.40 ml/m LA Biplane Vol: 36.0 ml 16.48 ml/m  AORTIC VALVE AV Area (Vmax): 3.61 cm AV Vmax:        114.00 cm/s AV Peak Grad:   5.2 mmHg LVOT Vmax:      99.00 cm/s LVOT Vmean:     59.200 cm/s LVOT VTI:       0.204 m  AORTA Ao Root diam: 3.40 cm Ao Asc diam:  3.40 cm MITRAL VALVE               TRICUSPID VALVE MV Area (PHT): 3.48 cm    Estimated RAP:  3.00 mmHg MV Decel Time: 218 msec MR Peak grad: 95.6 mmHg    SHUNTS MR Vmax:      489.00 cm/s  Systemic VTI:  0.20 m MV E velocity: 75.50 cm/s  Systemic Diam: 2.30 cm MV A velocity: 76.50 cm/s MV E/A ratio:  0.99 Aditya Sabharwal Electronically signed by Ria Commander Signature Date/Time: 10/31/2023/11:24:18 AM    Final    CARDIAC CATHETERIZATION Result Date: 10/24/2023   Prox RCA lesion is 45% stenosed.   RPDA lesion is 90% stenosed.   Prox LAD to Mid LAD lesion is 45% stenosed.   1st Diag lesion is 90% stenosed.   2nd Mrg lesion is 90% stenosed.   Lat 2nd Mrg lesion is 90% stenosed.   LV end diastolic pressure is normal. 3 vessel obstructive CAD. This mostly involves smaller distal and side branches including first  diagonal, PDA and sub branch of OM2. The OM2 is a very large branch with a 90% proximal stenosis. This supplies a large area Normal LVEDP Plan: would recommend PCI of the large OM2 and then treat the remainder of disease medically. The smaller branches are poorly suited for PCI or bypass. Given CKD will stage procedure. Initiate plavix  therapy.    Disposition   Pt is being discharged home today in good  condition.  Follow-up Plans & Appointments     Discharge Instructions     Amb Referral to Cardiac Rehabilitation   Complete by: As directed    Diagnosis: Coronary Stents   After initial evaluation and assessments completed: Virtual Based Care may be provided alone or in conjunction with Phase 2 Cardiac Rehab based on patient barriers.: Yes   Intensive Cardiac Rehabilitation (ICR) [facility] location only OR Traditional Cardiac Rehabilitation (TCR) *If criteria for ICR are not met will enroll in TCR [facility] only): Yes        Discharge Medications   Allergies as of 11/07/2023   No Known Allergies      Medication List     STOP taking these medications    omeprazole  40 MG capsule Commonly known as: PRILOSEC   spironolactone  25 MG tablet Commonly known as: ALDACTONE        TAKE these medications    amLODipine  10 MG tablet Commonly known as: NORVASC  Take 1 tablet by mouth once daily   aspirin  EC 81 MG tablet Take 81 mg by mouth in the morning.   carvedilol  25 MG tablet Commonly known as: COREG  Take 1 tablet by mouth twice daily   clopidogrel  75 MG tablet Commonly known as: Plavix  Take 1 tablet (75 mg total) by mouth daily.   fenofibrate  145 MG tablet Commonly known as: Tricor  Take 1 tablet (145 mg total) by mouth daily.   FreeStyle Libre 3 Plus Sensor Misc 1 each by Does not apply route. Change sensor every 15 days.   irbesartan  300 MG tablet Commonly known as: AVAPRO  Take 1 tablet by mouth once daily   multivitamin with minerals Tabs tablet Take 1 tablet by mouth  in the morning.   nitroGLYCERIN  0.4 MG SL tablet Commonly known as: NITROSTAT  Place 1 tablet (0.4 mg total) under the tongue every 5 (five) minutes as needed for chest pain.   omega-3 acid ethyl esters 1 g capsule Commonly known as: LOVAZA  Take 2 capsules (2 g total) by mouth 2 (two) times daily.   pantoprazole  40 MG tablet Commonly known as: Protonix  Take 1 tablet (40 mg total) by mouth daily.   rosuvastatin  20 MG tablet Commonly known as: CRESTOR  Take 1 tablet (20 mg total) by mouth daily.   vitamin C 1000 MG tablet Take 1,000 mg by mouth at bedtime.   Vitamin D3 250 MCG (10000 UT) Tabs Take 10,000 Units by mouth at bedtime.        Allergies No Known Allergies  Outstanding Labs/Studies   BMET at follow up  Duration of Discharge Encounter   Greater than 30 minutes including physician time.  Signed, Manuelita Rummer, NP 11/07/2023, 3:20 PM

## 2023-11-07 NOTE — Progress Notes (Signed)
 CARDIAC REHAB PHASE I     Post stent education including site care, restrictions, risk factors, exercise guidelines, NTG use, antiplatelet therapy importance, heart healthy diabetic diet, smoking cessation and CRP2 reviewed. All questions and concerns addressed. Will refer to [facility] for CRP2. Plan for home later today.    [id number] Vaughn Asberry Hacking, RN BSN 11/07/2023 2:54 PM

## 2023-11-08 ENCOUNTER — Encounter (HOSPITAL_COMMUNITY): Payer: Self-pay | Admitting: Cardiology

## 2023-11-08 MED FILL — Verapamil HCl IV Soln 2.5 MG/ML: INTRAVENOUS | Qty: 2 | Status: AC

## 2023-11-12 ENCOUNTER — Telehealth (HOSPITAL_COMMUNITY): Payer: Self-pay

## 2023-11-12 NOTE — Telephone Encounter (Signed)
 Attempted to call patient in regards to Cardiac Rehab - LM on VM

## 2023-11-15 ENCOUNTER — Ambulatory Visit (HOSPITAL_BASED_OUTPATIENT_CLINIC_OR_DEPARTMENT_OTHER): Admitting: Nurse Practitioner

## 2023-11-19 ENCOUNTER — Other Ambulatory Visit: Payer: Self-pay | Admitting: Internal Medicine

## 2023-11-19 DIAGNOSIS — I119 Hypertensive heart disease without heart failure: Secondary | ICD-10-CM

## 2023-11-19 DIAGNOSIS — I10 Essential (primary) hypertension: Secondary | ICD-10-CM

## 2023-11-21 NOTE — Telephone Encounter (Signed)
 Last OV 06/18/23, f/u 6 months Next OV not scheduled  Last refill 08/28/23 Qty #90/0

## 2023-11-26 ENCOUNTER — Encounter: Payer: Self-pay | Admitting: Physician Assistant

## 2023-11-26 ENCOUNTER — Ambulatory Visit: Attending: Physician Assistant | Admitting: Physician Assistant

## 2023-11-26 VITALS — BP 123/74 | HR 64 | Ht 69.0 in | Wt 234.4 lb

## 2023-11-26 DIAGNOSIS — I10 Essential (primary) hypertension: Secondary | ICD-10-CM

## 2023-11-26 DIAGNOSIS — I2510 Atherosclerotic heart disease of native coronary artery without angina pectoris: Secondary | ICD-10-CM | POA: Diagnosis not present

## 2023-11-26 DIAGNOSIS — I25111 Atherosclerotic heart disease of native coronary artery with angina pectoris with documented spasm: Secondary | ICD-10-CM | POA: Diagnosis not present

## 2023-11-26 DIAGNOSIS — R9389 Abnormal findings on diagnostic imaging of other specified body structures: Secondary | ICD-10-CM

## 2023-11-26 DIAGNOSIS — E785 Hyperlipidemia, unspecified: Secondary | ICD-10-CM | POA: Diagnosis not present

## 2023-11-26 DIAGNOSIS — Z955 Presence of coronary angioplasty implant and graft: Secondary | ICD-10-CM

## 2023-11-26 DIAGNOSIS — E119 Type 2 diabetes mellitus without complications: Secondary | ICD-10-CM | POA: Diagnosis not present

## 2023-11-26 DIAGNOSIS — R079 Chest pain, unspecified: Secondary | ICD-10-CM

## 2023-11-26 DIAGNOSIS — R0602 Shortness of breath: Secondary | ICD-10-CM

## 2023-11-26 NOTE — Patient Instructions (Signed)
 Medication Instructions:  Your physician recommends that you continue on your current medications as directed. Please refer to the Current Medication list given to you today.  *If you need a refill on your cardiac medications before your next appointment, please call your pharmacy*  Lab Work: None ordered If you have labs (blood work) drawn today and your tests are completely normal, you will receive your results only by: MyChart Message (if you have MyChart) OR A paper copy in the mail If you have any lab test that is abnormal or we need to change your treatment, we will call you to review the results.  Follow-Up: At [facility], you and your health needs are our priority.  As part of our continuing mission to provide you with exceptional heart care, our providers are all part of one team.  This team includes your primary Cardiologist (physician) and Advanced Practice Providers or APPs (Physician Assistants and Nurse Practitioners) who all work together to provide you with the care you need, when you need it.  Your next appointment:   3-4 month(s)  Provider:   Shelda Bruckner, MD

## 2023-11-26 NOTE — Progress Notes (Signed)
 Cardiology Office Note   Date:  11/28/2023  ID:  Tim Decker, Tim Decker [birth year], MRN [id number] PCP: [name], MD  [facility] Providers Cardiologist:  Shelda Bruckner, MD     History of Present Illness Tim Decker is a 69 y.o. male with past medical history of CAD, cardiomyopathy, hypertension, hyperlipidemia and DM II.  Echocardiogram in 2020 showed EF 35 to 40%, severe.  Myoview  in 2020 showed EF 44%, mild diffuse hypokinesis, small fixed defect in the apical area, no ischemia.  He declined Entresto due to cost.  EF normalized to 55 to 60% by February 2023.  Coronary CT obtained in 2025 showed calcium  score 1308 which placed the patient 91st percentile for age and sex matched control, severe mid LAD lesion, moderate D1 and left circumflex stenosis.  Patient was seen by Dr. Shelda Bruckner on 08/13/2023 at which time he denied any chest pain and has nonlimiting dyspnea on exertion.  Cardiac catheterization was discussed with the patient however given all major and high risk symptom, it was decided to focus on medical management first.  I last the patient on 10/18/2023 at which time, he complained of increasing dyspnea on exertion and the chest discomfort.  I set up outpatient echocardiogram and cath.  Echocardiogram performed on 10/31/2023 showed EF 55 to 60%, no regional wall motion abnormality, grade 1 DD, mild MR.  Cardiac catheterization performed on 10/24/2023 showed 45% proximal RCA lesion, 90% RPDA lesion, 45% proximal to mid LAD lesion, 90% D1 lesion, 90% OM2 lesion, 90% lateral OM 2 lesion.  It was recommended to consider PCI of large OM 2 and treat remainder of the disease medically.  Patient returned on 11/07/2023 for staged intervention of OM 2 lesion, he underwent DES  x 2 to proximal and distal OM 2.  Postprocedure, he was placed on aspirin  and Plavix .  With recommendation to consider dual antiplatelet therapy for minimum 6 months.  Patient presents today  for posthospital follow-up.  He has been compliant with his medication.  He denies any major chest pain.  He occasionally noticed some fluttering sensation.  He has went back to work and has no problem working all day.  EKG shows new first-degree AV block, no significant ST-T wave changes.  I recommended continue on the current therapy.  Follow-up with Dr. Bruckner in 3 to 4 months.  ROS:   He denies chest pain, palpitations, dyspnea, pnd, orthopnea, n, v, dizziness, syncope, edema, weight gain, or early satiety. All other systems reviewed and are otherwise negative except as noted above.    Studies Reviewed EKG Interpretation Date/Time:  Monday November 26 2023 10:46:17 EDT Ventricular Rate:  65 PR Interval:  312 QRS Duration:  104 QT Interval:  392 QTC Calculation: 407 R Axis:   -77  Text Interpretation: Sinus rhythm with 1st degree A-V block Left axis deviation Incomplete right bundle branch block When compared with ECG of 07-Nov-2023 13:52, Incomplete right bundle branch block is now Present Confirmed by Timya Trimmer [phone]) on 11/26/2023 11:40:23 AM    Cardiac Studies & Procedures   ______________________________________________________________________________________________ CARDIAC CATHETERIZATION  CARDIAC CATHETERIZATION 11/07/2023  Conclusion   Prox LAD to Mid LAD lesion is 45% stenosed.   Prox RCA lesion is 45% stenosed.   1st Diag lesion is 90% stenosed.   2nd Mrg lesion is 90% stenosed.   Lat 2nd Mrg lesion is 90% stenosed.   RPDA lesion is 90% stenosed.   A drug-eluting stent was successfully placed using a STENT  SYNERGY XD 2.50X16.   A drug-eluting stent was successfully placed using a STENT SYNERGY XD 3.50X20.   Post intervention, there is a 0% residual stenosis.   Post intervention, there is a 0% residual stenosis.   Recommend uninterrupted dual antiplatelet therapy with Aspirin  81mg  daily and Clopidogrel  75mg  daily for a minimum of 6 months (stable ischemic heart  disease-Class I recommendation).  Successful PCI of the second OM proximally and distally with DES x 2   Plan: same day DC. DAPT for at least 6 months  Findings Coronary Findings Diagnostic  Dominance: Right  Left Main Vessel is normal in caliber. The vessel exhibits minimal luminal irregularities.  Left Anterior Descending Prox LAD to Mid LAD lesion is 45% stenosed.  First Diagonal Branch Vessel is small in size. 1st Diag lesion is 90% stenosed.  Left Circumflex  First Obtuse Marginal Branch Vessel is small in size.  Second Obtuse Marginal Branch Vessel is large in size. 2nd Mrg lesion is 90% stenosed.  Lateral Second Obtuse Marginal Branch Lat 2nd Mrg lesion is 90% stenosed.  Right Coronary Artery Prox RCA lesion is 45% stenosed.  Right Posterior Descending Artery Vessel is small in size. RPDA lesion is 90% stenosed.  Intervention  2nd Mrg lesion Stent CATH LAUNCHER 6FR EBU3.5 guide catheter was inserted. Lesion crossed with guidewire using a WIRE ASAHI PROWATER 180CM. Pre-stent angioplasty was performed using a BALLOON EMERGE MR 2.5X12. A drug-eluting stent was successfully placed using a STENT SYNERGY XD 3.50X20. Stent strut is well apposed. Post-stent angioplasty was performed using a BALLOON [place] EMERGE MR [id number]. Maximum pressure:  14 atm. Post-Intervention Lesion Assessment The intervention was successful. Pre-interventional TIMI flow is 3. Post-intervention TIMI flow is 3. No complications occurred at this lesion. There is a 0% residual stenosis post intervention.  Lat 2nd Mrg lesion Stent CATH LAUNCHER 6FR EBU3.5 guide catheter was inserted. Lesion crossed with guidewire using a WIRE ASAHI PROWATER 180CM. Pre-stent angioplasty was performed using a BALLOON EMERGE MR 2.5X12. A drug-eluting stent was successfully placed using a STENT SYNERGY XD 2.50X16. Maximum pressure: 12 atm. Stent strut is well apposed. Post-Intervention Lesion Assessment The  intervention was successful. Pre-interventional TIMI flow is 3. Post-intervention TIMI flow is 3. No complications occurred at this lesion. There is a 0% residual stenosis post intervention.   CARDIAC CATHETERIZATION  CARDIAC CATHETERIZATION 10/24/2023  Conclusion   Prox RCA lesion is 45% stenosed.   RPDA lesion is 90% stenosed.   Prox LAD to Mid LAD lesion is 45% stenosed.   1st Diag lesion is 90% stenosed.   2nd Mrg lesion is 90% stenosed.   Lat 2nd Mrg lesion is 90% stenosed.   LV end diastolic pressure is normal.  3 vessel obstructive CAD. This mostly involves smaller distal and side branches including first diagonal, PDA and sub branch of OM2. The OM2 is a very large branch with a 90% proximal stenosis. This supplies a large area Normal LVEDP  Plan: would recommend PCI of the large OM2 and then treat the remainder of disease medically. The smaller branches are poorly suited for PCI or bypass. Given CKD will stage procedure. Initiate plavix  therapy.  Findings Coronary Findings Diagnostic  Dominance: Right  Left Main Vessel was injected. Vessel is normal in caliber. The vessel exhibits minimal luminal irregularities.  Left Anterior Descending Prox LAD to Mid LAD lesion is 45% stenosed.  First Diagonal Branch Vessel is small in size. 1st Diag lesion is 90% stenosed.  Left Circumflex  First Obtuse Marginal Branch  Vessel is small in size.  Second Obtuse Marginal Branch Vessel is large in size. 2nd Mrg lesion is 90% stenosed.  Lateral Second Obtuse Marginal Branch Lat 2nd Mrg lesion is 90% stenosed.  Right Coronary Artery Prox RCA lesion is 45% stenosed.  Right Posterior Descending Artery Vessel is small in size. RPDA lesion is 90% stenosed.  Intervention  No interventions have been documented.   STRESS TESTS  MYOCARDIAL PERFUSION IMAGING 02/19/2019  Interpretation Summary  Nuclear stress EF: 44%. Mild diffuse hypokinesis  The left ventricular ejection  fraction is mildly decreased (45-54%).  There was no ST segment deviation noted during stress.  Defect 1: There is a small defect of mild severity present in the apical inferior and apical lateral location. Fixed defect.  This is an intermediate risk study based upon reduced EF. Consider echocardiogram for verification. There is no ischemia identified.  [name], MD   ECHOCARDIOGRAM  ECHOCARDIOGRAM COMPLETE 10/31/2023  Narrative ECHOCARDIOGRAM REPORT    Patient Name:   Tim Decker [facility] Date of Exam: 10/31/2023 Medical Rec #:  [id number]          Height:       69.0 in Accession #:    [id number]         Weight:       228.0 lb Date of Birth:  [birth year]          BSA:          2.184 m Patient Age:    67 years           BP:           122/82 mmHg Patient Gender: M                  HR:           63 bpm. Exam Location:  [address]  Procedure: 2D Echo, Cardiac Doppler, Color Doppler and Strain Analysis (Both Spectral and Color Flow Doppler were utilized during procedure).  Indications:    SOB; R06.02 SOB  History:        Patient has prior history of Echocardiogram examinations. Signs/Symptoms:Chest Pain and Shortness of Breath.  Sonographer:    Cherene Ravens RDCS Referring Phys: [id number] Giulliana Mcroberts  IMPRESSIONS   1. Left ventricular ejection fraction, by estimation, is 55 to 60%. The left ventricle has normal function. The left ventricle has no regional wall motion abnormalities. There is mild concentric left ventricular hypertrophy. Left ventricular diastolic parameters are consistent with Grade I diastolic dysfunction (impaired relaxation). The average left ventricular global longitudinal strain is -13.5 %. The global longitudinal strain is abnormal. 2. Right ventricular systolic function is normal. The right ventricular size is normal. 3. The mitral valve is normal in structure. Mild mitral valve regurgitation. No evidence of mitral stenosis. 4. The aortic valve is normal in  structure. There is mild calcification of the aortic valve. Aortic valve regurgitation is not visualized. No aortic stenosis is present. 5. The inferior vena cava is normal in size with greater than 50% respiratory variability, suggesting right atrial pressure of 3 mmHg.  FINDINGS Left Ventricle: Left ventricular ejection fraction, by estimation, is 55 to 60%. The left ventricle has normal function. The left ventricle has no regional wall motion abnormalities. The average left ventricular global longitudinal strain is -13.5 %. Strain was performed and the global longitudinal strain is abnormal. The left ventricular internal cavity size was normal in size. There is mild concentric left ventricular hypertrophy. Left ventricular diastolic parameters are  consistent with Grade I diastolic dysfunction (impaired relaxation).  Right Ventricle: The right ventricular size is normal. No increase in right ventricular wall thickness. Right ventricular systolic function is normal.  Left Atrium: Left atrial size was normal in size.  Right Atrium: Right atrial size was normal in size.  Pericardium: There is no evidence of pericardial effusion.  Mitral Valve: The mitral valve is normal in structure. Mild mitral valve regurgitation. No evidence of mitral valve stenosis.  Tricuspid Valve: The tricuspid valve is normal in structure. Tricuspid valve regurgitation is not demonstrated. No evidence of tricuspid stenosis.  Aortic Valve: The aortic valve is normal in structure. There is mild calcification of the aortic valve. Aortic valve regurgitation is not visualized. No aortic stenosis is present. Aortic valve peak gradient measures 5.2 mmHg.  Pulmonic Valve: The pulmonic valve was normal in structure. Pulmonic valve regurgitation is not visualized. No evidence of pulmonic stenosis.  Aorta: The aortic root is normal in size and structure.  Venous: The inferior vena cava is normal in size with greater than 50%  respiratory variability, suggesting right atrial pressure of 3 mmHg.  IAS/Shunts: No atrial level shunt detected by color flow Doppler.   LEFT VENTRICLE PLAX 2D LVIDd:         5.40 cm   Diastology LVIDs:         3.70 cm   LV e' medial:    6.85 cm/s LV PW:         1.30 cm   LV E/e' medial:  11.0 LV IVS:        1.20 cm   LV e' lateral:   6.20 cm/s LVOT diam:     2.30 cm   LV E/e' lateral: 12.2 LV SV:         85 LV SV Index:   39        2D Longitudinal Strain LVOT Area:     4.15 cm  2D Strain GLS (A4C):   -13.2 % 2D Strain GLS (A3C):   -12.4 % 2D Strain GLS (A2C):   -15.0 % 2D Strain GLS Avg:     -13.5 %  RIGHT VENTRICLE             IVC RV Basal diam:  3.00 cm     IVC diam: 1.80 cm RV Mid diam:    2.50 cm RV S prime:     13.90 cm/s TAPSE (M-mode): 2.1 cm  LEFT ATRIUM             Index        RIGHT ATRIUM           Index LA diam:        4.10 cm 1.88 cm/m   RA Pressure: 3.00 mmHg LA Vol (A2C):   35.7 ml 16.34 ml/m  RA Area:     12.30 cm LA Vol (A4C):   35.6 ml 16.30 ml/m  RA Volume:   24.90 ml  11.40 ml/m LA Biplane Vol: 36.0 ml 16.48 ml/m AORTIC VALVE AV Area (Vmax): 3.61 cm AV Vmax:        114.00 cm/s AV Peak Grad:   5.2 mmHg LVOT Vmax:      99.00 cm/s LVOT Vmean:     59.200 cm/s LVOT VTI:       0.204 m  AORTA Ao Root diam: 3.40 cm Ao Asc diam:  3.40 cm  MITRAL VALVE               TRICUSPID  VALVE MV Area (PHT): 3.48 cm    Estimated RAP:  3.00 mmHg MV Decel Time: 218 msec MR Peak grad: 95.6 mmHg    SHUNTS MR Vmax:      489.00 cm/s  Systemic VTI:  0.20 m MV E velocity: 75.50 cm/s  Systemic Diam: 2.30 cm MV A velocity: 76.50 cm/s MV E/A ratio:  0.99  Aditya Sabharwal Electronically signed by Ria Commander Signature Date/Time: 10/31/2023/11:24:18 AM    Final      CT SCANS  CT CORONARY MORPH W/CTA COR W/SCORE 07/17/2023  Addendum 07/28/2023  2:18 AM ADDENDUM REPORT: 07/28/2023 02:15  EXAM: OVER-READ INTERPRETATION  CT CHEST  The following report  is an over-read performed by radiologist Dr. Suzen Dials of [facility] on 07/28/2023. This over-read does not include interpretation of cardiac or coronary anatomy or pathology. The coronary calcium  score/coronary CTA interpretation by the cardiologist is attached.  COMPARISON:  October 25, 2022  FINDINGS: Cardiovascular: There are no significant extracardiac vascular findings.  Mediastinum/Nodes: There are no enlarged lymph nodes within the visualized mediastinum.  Lungs/Pleura: There is no pleural effusion. The visualized lungs appear clear.  Upper abdomen: No significant findings in the visualized upper abdomen.  Musculoskeletal/Chest wall: No chest wall mass or suspicious osseous findings within the visualized chest.  IMPRESSION: No significant extracardiac findings within the visualized chest.   Electronically Signed By: Suzen Dials M.D. On: 07/28/2023 02:15  Narrative CLINICAL DATA:  73M with hypertension, hyperlipidemia, prediabetes, tobacco abuse, and cardiomyopathy with abnormal EKG.  EXAM: Cardiac/Coronary  CT  TECHNIQUE: The patient was scanned on a [employer].  FINDINGS: A 120 kV prospective scan was triggered in the descending thoracic aorta at 111 HU's. Axial non-contrast 3 mm slices were carried out through the heart. The data set was analyzed on a dedicated work station and scored using the Agatson method. Gantry rotation speed was 250 msecs and collimation was .6 mm. No beta blockade and 0.8 mg of sl NTG was given. The 3D data set was reconstructed in 5% intervals of the 67-82 % of the R-R cycle. Diastolic phases were analyzed on a dedicated work station using MPR, MIP and VRT modes. The patient received 80 cc of contrast.  Aorta: Normal size. Ascending aorta 3.8 cm. Aortic atherosclerosis. No dissection.  Aortic Valve:  Trileaflet.  Mild calcification.  Coronary Arteries:  Normal coronary origin.  Right  dominance.  RCA is a large dominant artery that gives rise to PDA and PLVB. There is mild (25-49%) plaque in the mid RCA and minimal (<25%) mixed plaque distally.  Left main is a large artery that gives rise to LAD and LCX arteries. There is minimal (<25%) calcified plaque distally.  LAD is a large vessel that has mild (25-49%) calcified plaque proximally. There is severe (>70%) mixed plaque in the mid and distal LAD. D1 has moderate calcified stenosis proximally and severe stenosis in the mid vessel.  LCX is a non-dominant artery that gives rise to one large OM1 branch. There is moderate (50-69%) mixed plaque in the proximal LCX. The mid LCX is not interpretable due to slab reconstruction artifact.  Coronary Calcium  Score:  Left main: 34.4  Left anterior descending artery: 798  Left circumflex artery: 334  Right coronary artery: 141  Total: 1,308  Percentile: 91st  Other findings:  Normal pulmonary vein drainage into the left atrium.  Normal let atrial appendage without a thrombus.  Normal size of the pulmonary artery.  Non-cardiac: See separate report from [facility].  IMPRESSION: 1. Coronary calcium  score of 1308. This was 91st percentile for age-, sex, and race-matched controls.  2. Total plaque volume was not assessed by HeartFlow due to artifact.  3. Normal coronary origin with right dominance.  4. There is severe (>70%) stenosis in the mid LAD. There is moderate (50-69%) stenosis in D1 and the LCX. CAD-RADS 4.  5. Study interpretation limited by reconstruction artifact and could not be submitted to HeartFlow.  6.  Recommend cardiac catheterization.  RECOMMENDATIONS: 1. CAD-RADS 0: No evidence of CAD (0%). Consider non-atherosclerotic causes of chest pain.  2. CAD-RADS 1: Minimal non-obstructive CAD (0-24%). Consider non-atherosclerotic causes of chest pain. Consider preventive therapy and risk factor modification.  3. CAD-RADS 2:  Mild non-obstructive CAD (25-49%). Consider non-atherosclerotic causes of chest pain. Consider preventive therapy and risk factor modification.  4. CAD-RADS 3: Moderate stenosis. Consider symptom-guided anti-ischemic pharmacotherapy as well as risk factor modification per guideline directed care. Additional analysis with CT FFR will be submitted.  5. CAD-RADS 4: Severe stenosis. (70-99% or > 50% left main). Cardiac catheterization or CT FFR is recommended. Consider symptom-guided anti-ischemic pharmacotherapy as well as risk factor modification per guideline directed care. Invasive coronary angiography recommended with revascularization per published guideline statements.  6. CAD-RADS 5: Total coronary occlusion (100%). Consider cardiac catheterization or viability assessment. Consider symptom-guided anti-ischemic pharmacotherapy as well as risk factor modification per guideline directed care.  7. CAD-RADS N: Non-diagnostic study. Obstructive CAD can't be excluded. Alternative evaluation is recommended.  Annabella Scarce, MD  Electronically Signed: By: Annabella Scarce M.D. On: 07/18/2023 17:32     ______________________________________________________________________________________________      Risk Assessment/Calculations          Physical Exam VS:  BP 123/74   Pulse 64   Ht 5' 9 (1.753 m)   Wt 234 lb 6.4 oz (106.3 kg)   SpO2 98%   BMI 34.61 kg/m        Wt Readings from Last 3 Encounters:  11/26/23 234 lb 6.4 oz (106.3 kg)  11/07/23 230 lb (104.3 kg)  10/24/23 228 lb (103.4 kg)    GEN: Well nourished, well developed in no acute distress NECK: No JVD; No carotid bruits CARDIAC: RRR, no murmurs, rubs, gallops RESPIRATORY:  Clear to auscultation without rales, wheezing or rhonchi  ABDOMEN: Soft, non-tender, non-distended EXTREMITIES:  No edema; No deformity   ASSESSMENT AND PLAN  CAD: Recently underwent DES x 2 to proximal and distal OM 2.   Postprocedure, he has been compliant with dual antiplatelet therapy aspirin  and Plavix .  He denies any major chest pain.  Hypertension: Blood pressure well-controlled  Hyperlipidemia: Continue rosuvastatin  20 mg daily and Lovaza   DM2: Managed by primary care provider.    Cardiac Rehabilitation Eligibility Assessment  The patient is ready to start cardiac rehabilitation from a cardiac standpoint.       Dispo: Follow-up with Dr. Lonni in 3 to 4 months.  Signed, Scot Ford, PA

## 2023-11-30 ENCOUNTER — Telehealth (HOSPITAL_COMMUNITY): Payer: Self-pay

## 2023-11-30 NOTE — Telephone Encounter (Signed)
 Attempted to call patient regarding cardiac rehab- no answer, left message. Sent MyChart message.

## 2023-11-30 NOTE — Telephone Encounter (Signed)
 Pt insurance is active and benefits verified through [facility]. Co-pay $40(TCR)/$45(ICR), DED $0/$0 met, out of pocket $6,700/$1,543.86 met, co-insurance 0%. No pre-authorization required. 11/30/2023 @ 3:51pm, spoke with [place], REF# [id number].  TCR/ICR? Either ($40 vs $45 copay) Visit(date of service)limitation? No Can multiple codes be used on the same date of service/visit?(IF ITS A LIMIT) Yes  Is this a lifetime maximum or an annual maximum? Annual Has the member used any of these services to date? No Is there a time limit (weeks/months) on start of program and/or program completion? No

## 2023-12-14 ENCOUNTER — Telehealth (HOSPITAL_COMMUNITY): Payer: Self-pay

## 2023-12-14 NOTE — Telephone Encounter (Signed)
No response from pt in regards to cardiac rehab. Closed referral 

## 2023-12-18 ENCOUNTER — Other Ambulatory Visit: Payer: Self-pay | Admitting: Internal Medicine

## 2023-12-18 DIAGNOSIS — I429 Cardiomyopathy, unspecified: Secondary | ICD-10-CM

## 2023-12-18 DIAGNOSIS — E876 Hypokalemia: Secondary | ICD-10-CM

## 2023-12-18 DIAGNOSIS — I119 Hypertensive heart disease without heart failure: Secondary | ICD-10-CM

## 2023-12-18 DIAGNOSIS — I10 Essential (primary) hypertension: Secondary | ICD-10-CM

## 2023-12-20 ENCOUNTER — Other Ambulatory Visit: Payer: Self-pay | Admitting: Internal Medicine

## 2023-12-20 DIAGNOSIS — I10 Essential (primary) hypertension: Secondary | ICD-10-CM

## 2023-12-20 DIAGNOSIS — E876 Hypokalemia: Secondary | ICD-10-CM

## 2023-12-20 DIAGNOSIS — I119 Hypertensive heart disease without heart failure: Secondary | ICD-10-CM

## 2024-01-21 ENCOUNTER — Other Ambulatory Visit: Payer: Self-pay | Admitting: Internal Medicine

## 2024-01-21 DIAGNOSIS — I10 Essential (primary) hypertension: Secondary | ICD-10-CM

## 2024-01-21 DIAGNOSIS — I119 Hypertensive heart disease without heart failure: Secondary | ICD-10-CM

## 2024-01-21 DIAGNOSIS — E876 Hypokalemia: Secondary | ICD-10-CM

## 2024-01-23 DIAGNOSIS — E669 Obesity, unspecified: Secondary | ICD-10-CM | POA: Diagnosis not present

## 2024-01-23 DIAGNOSIS — I25119 Atherosclerotic heart disease of native coronary artery with unspecified angina pectoris: Secondary | ICD-10-CM | POA: Diagnosis not present

## 2024-01-23 DIAGNOSIS — E1169 Type 2 diabetes mellitus with other specified complication: Secondary | ICD-10-CM | POA: Diagnosis not present

## 2024-01-23 DIAGNOSIS — E7849 Other hyperlipidemia: Secondary | ICD-10-CM | POA: Diagnosis not present

## 2024-01-23 DIAGNOSIS — Z6833 Body mass index (BMI) 33.0-33.9, adult: Secondary | ICD-10-CM | POA: Diagnosis not present

## 2024-01-23 DIAGNOSIS — Z008 Encounter for other general examination: Secondary | ICD-10-CM | POA: Diagnosis not present

## 2024-02-26 ENCOUNTER — Encounter (HOSPITAL_BASED_OUTPATIENT_CLINIC_OR_DEPARTMENT_OTHER): Payer: Self-pay

## 2024-02-27 ENCOUNTER — Ambulatory Visit (HOSPITAL_BASED_OUTPATIENT_CLINIC_OR_DEPARTMENT_OTHER): Admitting: Cardiology

## 2024-02-27 ENCOUNTER — Encounter (HOSPITAL_BASED_OUTPATIENT_CLINIC_OR_DEPARTMENT_OTHER): Payer: Self-pay | Admitting: Cardiology

## 2024-02-27 VITALS — BP 130/82 | HR 57 | Ht 69.0 in | Wt 233.7 lb

## 2024-02-27 DIAGNOSIS — E782 Mixed hyperlipidemia: Secondary | ICD-10-CM | POA: Diagnosis not present

## 2024-02-27 DIAGNOSIS — I428 Other cardiomyopathies: Secondary | ICD-10-CM

## 2024-02-27 DIAGNOSIS — I10 Essential (primary) hypertension: Secondary | ICD-10-CM

## 2024-02-27 DIAGNOSIS — E1121 Type 2 diabetes mellitus with diabetic nephropathy: Secondary | ICD-10-CM

## 2024-02-27 DIAGNOSIS — Z716 Tobacco abuse counseling: Secondary | ICD-10-CM

## 2024-02-27 DIAGNOSIS — I2510 Atherosclerotic heart disease of native coronary artery without angina pectoris: Secondary | ICD-10-CM

## 2024-02-27 DIAGNOSIS — Z955 Presence of coronary angioplasty implant and graft: Secondary | ICD-10-CM

## 2024-02-27 DIAGNOSIS — N1832 Chronic kidney disease, stage 3b: Secondary | ICD-10-CM

## 2024-02-27 NOTE — Patient Instructions (Addendum)
 Check blood pressure intermittently at home. If it is consistently more than 130/80, please let me know and we will talk about medication changes.  _____________________________________________  Medication Instructions:  No changes *If you need a refill on your cardiac medications before your next appointment, please call your pharmacy*  Lab Work: Lipids, lp(a) - either here or PCP  Testing/Procedures: none  Follow-Up: At [facility], you and your health needs are our priority.  As part of our continuing mission to provide you with exceptional heart care, our providers are all part of one team.  This team includes your primary Cardiologist (physician) and Advanced Practice Providers or APPs (Physician Assistants and Nurse Practitioners) who all work together to provide you with the care you need, when you need it.  Your next appointment:   6 month(s)  Provider:   Shelda Bruckner, MD, Rosaline Bane, NP, or Reche Finder, NP

## 2024-02-27 NOTE — Progress Notes (Signed)
 Cardiology Office Note:  .   Date:  02/27/2024  ID:  Tim Decker, DOB November 13, 1955, MRN [id number] PCP: Tim Debby CROME, MD  [facility] Providers Cardiologist:  Shelda Bruckner, MD {  History of Present Illness: .   Tim Decker is a 68 y.o. male with a hx of CAD, cardiomyopathy (previously presumed nonischemic), hyperlipidemia, hypertension, and pre-diabetes, who is seen for follow-up. I initially met him 02/08/2021 as a new consult at the request of Tim Debby CROME, MD for the evaluation and management of abnormal electrocardiogram results.    CV history: echo 2020 with EF 35-40%. Severe LVH. Myoview  2020 EF 44%, mild diffuse hypokinesis. Small fixed mild defect in apical area, no ischemia. LVEF normalized to 55-60% 05/2021. We have discussed Entresto which he declined due to cost. Coronary CT 2025 with Ca score 1308 (91st %ile), severe mLAD stenosis, moderate D1 and Lcx stenosis. Cath and then PCI in 10/2023 with significant improvement in symptoms.    Fhx: his father died of a myocardial infarction at age 68. Smoking is heavy in his family. His sister smokes, and has suffered a stroke and had a CABG procedure.  Today:  S/P PCI to OM2 with DES x2 to proximal and distal vessel 11/07/23. Since stents placed, has felt much better. Breathing is much better. Has not required nitroglycerin . Only thing he has noticed is that his left hand tingles all the time, sometimes painful. Stents were placed through right radial, not left.  Doesn't check blood pressure routinely at home but when he has, has been well controlled.   Very active with his job, works outside all day. No limitations. Still smoking 1 ppd, precontemplative.  ROS: Denies chest pain, shortness of breath at rest or with normal exertion. No PND, orthopnea, LE edema or unexpected weight gain. No syncope or palpitations. ROS otherwise negative except as noted.   Studies Reviewed: SABRA    EKG:        Physical  Exam:   VS:  BP 130/82   Pulse (!) 57   Ht 5' 9 (1.753 m)   Wt 233 lb 11.2 oz (106 kg)   SpO2 96%   BMI 34.51 kg/m    Wt Readings from Last 3 Encounters:  02/27/24 233 lb 11.2 oz (106 kg)  11/26/23 234 lb 6.4 oz (106.3 kg)  11/07/23 230 lb (104.3 kg)    GEN: Well nourished, well developed in no acute distress HEENT: Normal, moist mucous membranes NECK: No JVD CARDIAC: regular rhythm, normal S1 and S2, no rubs or gallops. No murmur. VASCULAR: Radial and DP pulses 2+ bilaterally. No carotid bruits RESPIRATORY:  Clear to auscultation without rales, wheezing or rhonchi  ABDOMEN: Soft, non-tender, non-distended MUSCULOSKELETAL:  Ambulates independently SKIN: Warm and dry, no edema NEUROLOGIC:  Alert and oriented x 3. No focal neuro deficits noted. PSYCHIATRIC:  Normal affect    ASSESSMENT AND PLAN: .    CAD S/P DES x2 to OM2 10/2023 for stable ischemic heart disease -Ca score 1308 (91st %ile), severe mLAD, moderate D1 and Lcx. -CT coronary 07/28/23 with significant artifact, noted to have disease but unable to submit to heart flow, recommended cath -Cath 10/24/23 with 3 vessel disease, mostly of smaller distal/side branches not good targets for PCI or bypass. Recommended staged PCI -PCI 11/07/23 with DES x2 to proximal and distal OM2 -dyspnea on exertion improved post PCI -planned for DAPT with aspirin  and clopidogrel  for at least 6 mos -discussed importance of management of risk factors,  especially lipids, tobacco use, type II diabetes, hypertension -reviewed red flag warning signs that need immediate medical attention  Cardiomyopathy with recovered EF, suspect nonischemic due to stress test, likely 2/2 hypertensive heart disease Severe LVH->most recent reported as mild LVH Chronic kidney disease, stage 3b -NYHA class I -EF 35-40% in 02/2019-->normalized to 55-60% 05/2021, most recent 10/31/23 remains at 55-60% with no wall motion abnormalities -had myoview  at initial diagnosis  without ischemia -on carvedilol  -on irbesartan  -was on spironolactone , this was held pre-cath due to AKI when Cr 2.47. Most recent labs were improved and his BP was well controlled at visit with Hao Meng post cath, so spironolactone  not restarted -was on SGLT2i, no longer taking -we previously discussed entresto, declined due to cost   Hypertension: -improved on recheck  -on carvedilol , amlodipine , irbesartan  -spironolactone  stopped as above -doxazosin  stopped 06/2023 by PCP, when BP was well controlled, is an option if needed -check home BP, contacting me if consistently >130/80   Mixed hyperlipidemia, with severely elevated TG -continue rosuvastatin . LDL was 60 on 10 mg daily dose. Discussed goal LDL <55 -no longer taking lovaza  2/2 cost. Declines vascepa . We increased rosuvastatin  20 mg daily and fenofibrate  145 mg daily -has not had repeat labs drawn, reordered today, knows to get them done fasting -discussed potential for clinical trial based on response -checking lpa with recheck labs   Type II diabetes, with proteinuria Obesity -on aspirin , rosuvastatin  -no longer on dapagliflozin  due to cost -now on continuous glucose monitor, sugars well controlled, not taking insulin  -would benefit from GLP for CV risk reduction, cost is major factor. We discussed today. He is interested in clinical trial if he qualifies -A1c 7.5   Tobacco abuse: The patient was counseled on tobacco cessation today for 3 minutes.  Counseling included reviewing the risks of smoking tobacco products, how it impacts the patient's current medical diagnoses and different strategies for quitting.  Pharmacotherapy to aid in tobacco cessation was not prescribed today.   CV risk counseling and prevention -recommend heart healthy/Mediterranean diet, with whole grains, fruits, vegetable, fish, lean meats, nuts, and olive oil. Limit salt. -recommend moderate walking, 3-5 times/week for 30-50 minutes each session. Aim  for at least 150 minutes.week. Goal should be pace of 3 miles/hours, or walking 1.5 miles in 30 minutes -recommend avoidance of tobacco products. Avoid excess alcohol.  Dispo: 6 mos, or sooner as needed  Signed, Shelda Bruckner, MD   Shelda Bruckner, MD, PhD, [facility] [place]  [facility]  [place]  [facility] [address], [address] [place], [place] [address] [phone]

## 2024-03-04 ENCOUNTER — Other Ambulatory Visit: Payer: Self-pay | Admitting: Cardiology

## 2024-03-04 ENCOUNTER — Other Ambulatory Visit: Payer: Self-pay | Admitting: Internal Medicine

## 2024-03-04 DIAGNOSIS — I119 Hypertensive heart disease without heart failure: Secondary | ICD-10-CM

## 2024-03-04 DIAGNOSIS — I429 Cardiomyopathy, unspecified: Secondary | ICD-10-CM

## 2024-03-04 DIAGNOSIS — I10 Essential (primary) hypertension: Secondary | ICD-10-CM

## 2024-04-21 ENCOUNTER — Other Ambulatory Visit: Payer: Self-pay | Admitting: Internal Medicine

## 2024-04-21 DIAGNOSIS — E781 Pure hyperglyceridemia: Secondary | ICD-10-CM

## 2024-04-28 ENCOUNTER — Other Ambulatory Visit: Payer: Self-pay | Admitting: Internal Medicine

## 2024-04-28 DIAGNOSIS — I429 Cardiomyopathy, unspecified: Secondary | ICD-10-CM

## 2024-04-28 DIAGNOSIS — I10 Essential (primary) hypertension: Secondary | ICD-10-CM

## 2024-04-28 DIAGNOSIS — I119 Hypertensive heart disease without heart failure: Secondary | ICD-10-CM

## 2024-05-16 ENCOUNTER — Ambulatory Visit

## 2024-05-26 ENCOUNTER — Encounter: Admitting: Internal Medicine

## 2024-06-18 ENCOUNTER — Encounter: Admitting: Internal Medicine

## 2024-11-18 ENCOUNTER — Ambulatory Visit
